# Patient Record
Sex: Female | Born: 1980 | Race: Black or African American | Hispanic: No | Marital: Single | Smoking: Former smoker
Health system: Southern US, Community
[De-identification: ages and names within clinical notes are randomized; demographics above are authoritative.]

## PROBLEM LIST (undated history)

## (undated) DIAGNOSIS — N189 Chronic kidney disease, unspecified: Secondary | ICD-10-CM

## (undated) HISTORY — PX: TUBAL LIGATION: SHX77

---
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
## 2002-01-12 ENCOUNTER — Encounter: Admission: RE | Admit: 2002-01-12 | Discharge: 2002-01-12 | Payer: Self-pay | Admitting: Family Medicine

## 2002-01-19 ENCOUNTER — Ambulatory Visit (HOSPITAL_COMMUNITY): Admission: RE | Admit: 2002-01-19 | Discharge: 2002-01-19 | Payer: Self-pay | Admitting: Family Medicine

## 2002-03-13 ENCOUNTER — Encounter: Admission: RE | Admit: 2002-03-13 | Discharge: 2002-03-13 | Payer: Self-pay | Admitting: Family Medicine

## 2002-03-24 ENCOUNTER — Encounter: Admission: RE | Admit: 2002-03-24 | Discharge: 2002-03-24 | Payer: Self-pay | Admitting: Family Medicine

## 2002-03-24 ENCOUNTER — Encounter (INDEPENDENT_AMBULATORY_CARE_PROVIDER_SITE_OTHER): Payer: Self-pay | Admitting: *Deleted

## 2002-04-08 ENCOUNTER — Encounter: Admission: RE | Admit: 2002-04-08 | Discharge: 2002-04-08 | Payer: Self-pay | Admitting: Family Medicine

## 2002-04-15 ENCOUNTER — Encounter: Admission: RE | Admit: 2002-04-15 | Discharge: 2002-04-15 | Payer: Self-pay | Admitting: Family Medicine

## 2002-04-26 ENCOUNTER — Inpatient Hospital Stay (HOSPITAL_COMMUNITY): Admission: AD | Admit: 2002-04-26 | Discharge: 2002-04-28 | Payer: Self-pay | Admitting: *Deleted

## 2002-06-22 ENCOUNTER — Other Ambulatory Visit: Admission: RE | Admit: 2002-06-22 | Discharge: 2002-06-22 | Payer: Self-pay | Admitting: Family Medicine

## 2002-06-22 ENCOUNTER — Encounter (INDEPENDENT_AMBULATORY_CARE_PROVIDER_SITE_OTHER): Payer: Self-pay | Admitting: *Deleted

## 2002-06-22 ENCOUNTER — Encounter: Admission: RE | Admit: 2002-06-22 | Discharge: 2002-06-22 | Payer: Self-pay | Admitting: Family Medicine

## 2002-07-03 ENCOUNTER — Emergency Department (HOSPITAL_COMMUNITY): Admission: EM | Admit: 2002-07-03 | Discharge: 2002-07-03 | Payer: Self-pay | Admitting: Emergency Medicine

## 2002-08-06 ENCOUNTER — Encounter: Admission: RE | Admit: 2002-08-06 | Discharge: 2002-08-06 | Payer: Self-pay | Admitting: Family Medicine

## 2002-08-20 ENCOUNTER — Encounter: Admission: RE | Admit: 2002-08-20 | Discharge: 2002-08-20 | Payer: Self-pay | Admitting: Family Medicine

## 2004-08-16 ENCOUNTER — Ambulatory Visit: Payer: Self-pay | Admitting: Family Medicine

## 2004-08-21 ENCOUNTER — Encounter (INDEPENDENT_AMBULATORY_CARE_PROVIDER_SITE_OTHER): Payer: Self-pay | Admitting: *Deleted

## 2004-08-23 ENCOUNTER — Ambulatory Visit: Payer: Self-pay | Admitting: Family Medicine

## 2004-09-20 ENCOUNTER — Ambulatory Visit (HOSPITAL_COMMUNITY): Admission: RE | Admit: 2004-09-20 | Discharge: 2004-09-20 | Payer: Self-pay | Admitting: Family Medicine

## 2004-10-10 ENCOUNTER — Ambulatory Visit: Payer: Self-pay | Admitting: Family Medicine

## 2004-11-06 ENCOUNTER — Ambulatory Visit: Payer: Self-pay | Admitting: Family Medicine

## 2004-12-01 ENCOUNTER — Inpatient Hospital Stay (HOSPITAL_COMMUNITY): Admission: AD | Admit: 2004-12-01 | Discharge: 2004-12-01 | Payer: Self-pay | Admitting: Family Medicine

## 2004-12-07 ENCOUNTER — Ambulatory Visit: Payer: Self-pay | Admitting: Family Medicine

## 2004-12-08 ENCOUNTER — Ambulatory Visit (HOSPITAL_COMMUNITY): Admission: RE | Admit: 2004-12-08 | Discharge: 2004-12-08 | Payer: Self-pay | Admitting: Family Medicine

## 2004-12-20 ENCOUNTER — Inpatient Hospital Stay (HOSPITAL_COMMUNITY): Admission: AD | Admit: 2004-12-20 | Discharge: 2004-12-29 | Payer: Self-pay | Admitting: Obstetrics and Gynecology

## 2004-12-21 ENCOUNTER — Ambulatory Visit: Payer: Self-pay | Admitting: Obstetrics and Gynecology

## 2005-01-01 ENCOUNTER — Ambulatory Visit: Payer: Self-pay | Admitting: *Deleted

## 2005-01-04 ENCOUNTER — Ambulatory Visit: Payer: Self-pay | Admitting: Family Medicine

## 2005-01-08 ENCOUNTER — Ambulatory Visit: Payer: Self-pay | Admitting: *Deleted

## 2005-01-08 ENCOUNTER — Observation Stay (HOSPITAL_COMMUNITY): Admission: AD | Admit: 2005-01-08 | Discharge: 2005-01-09 | Payer: Self-pay | Admitting: *Deleted

## 2005-01-08 ENCOUNTER — Ambulatory Visit: Payer: Self-pay | Admitting: Obstetrics & Gynecology

## 2005-01-08 ENCOUNTER — Ambulatory Visit: Payer: Self-pay | Admitting: Pediatrics

## 2005-01-11 ENCOUNTER — Ambulatory Visit: Payer: Self-pay | Admitting: Family Medicine

## 2005-01-15 ENCOUNTER — Ambulatory Visit: Payer: Self-pay | Admitting: Family Medicine

## 2005-01-18 ENCOUNTER — Ambulatory Visit (HOSPITAL_COMMUNITY): Admission: RE | Admit: 2005-01-18 | Discharge: 2005-01-18 | Payer: Self-pay | Admitting: *Deleted

## 2005-01-18 ENCOUNTER — Ambulatory Visit: Payer: Self-pay | Admitting: Family Medicine

## 2005-01-22 ENCOUNTER — Inpatient Hospital Stay (HOSPITAL_COMMUNITY): Admission: AD | Admit: 2005-01-22 | Discharge: 2005-01-24 | Payer: Self-pay | Admitting: Obstetrics and Gynecology

## 2005-01-22 ENCOUNTER — Ambulatory Visit: Payer: Self-pay | Admitting: Obstetrics & Gynecology

## 2005-01-22 ENCOUNTER — Ambulatory Visit: Payer: Self-pay | Admitting: Obstetrics and Gynecology

## 2005-12-12 ENCOUNTER — Ambulatory Visit: Payer: Self-pay | Admitting: Family Medicine

## 2005-12-14 ENCOUNTER — Ambulatory Visit (HOSPITAL_COMMUNITY): Admission: RE | Admit: 2005-12-14 | Discharge: 2005-12-14 | Payer: Self-pay | Admitting: Obstetrics and Gynecology

## 2005-12-28 ENCOUNTER — Ambulatory Visit: Payer: Self-pay | Admitting: Family Medicine

## 2005-12-28 ENCOUNTER — Encounter (INDEPENDENT_AMBULATORY_CARE_PROVIDER_SITE_OTHER): Payer: Self-pay | Admitting: Specialist

## 2005-12-28 ENCOUNTER — Other Ambulatory Visit: Admission: RE | Admit: 2005-12-28 | Discharge: 2005-12-28 | Payer: Self-pay | Admitting: Family Medicine

## 2006-02-21 ENCOUNTER — Ambulatory Visit: Payer: Self-pay | Admitting: Family Medicine

## 2006-02-27 ENCOUNTER — Ambulatory Visit: Payer: Self-pay | Admitting: Family Medicine

## 2006-03-06 ENCOUNTER — Inpatient Hospital Stay (HOSPITAL_COMMUNITY): Admission: AD | Admit: 2006-03-06 | Discharge: 2006-03-06 | Payer: Self-pay | Admitting: Gynecology

## 2006-03-06 ENCOUNTER — Ambulatory Visit: Payer: Self-pay | Admitting: Family Medicine

## 2006-03-11 ENCOUNTER — Ambulatory Visit: Payer: Self-pay | Admitting: Family Medicine

## 2006-03-20 ENCOUNTER — Ambulatory Visit: Payer: Self-pay | Admitting: Family Medicine

## 2006-03-29 ENCOUNTER — Ambulatory Visit: Payer: Self-pay | Admitting: Family Medicine

## 2006-04-03 ENCOUNTER — Ambulatory Visit: Payer: Self-pay | Admitting: Family Medicine

## 2006-04-10 ENCOUNTER — Ambulatory Visit: Payer: Self-pay | Admitting: Family Medicine

## 2006-04-19 ENCOUNTER — Ambulatory Visit: Payer: Self-pay | Admitting: Family Medicine

## 2006-04-20 ENCOUNTER — Inpatient Hospital Stay (HOSPITAL_COMMUNITY): Admission: AD | Admit: 2006-04-20 | Discharge: 2006-04-22 | Payer: Self-pay | Admitting: Obstetrics and Gynecology

## 2006-04-20 ENCOUNTER — Ambulatory Visit: Payer: Self-pay | Admitting: Obstetrics and Gynecology

## 2006-09-12 DIAGNOSIS — A630 Anogenital (venereal) warts: Secondary | ICD-10-CM | POA: Insufficient documentation

## 2006-09-12 HISTORY — DX: Anogenital (venereal) warts: A63.0

## 2006-09-13 ENCOUNTER — Encounter (INDEPENDENT_AMBULATORY_CARE_PROVIDER_SITE_OTHER): Payer: Self-pay | Admitting: *Deleted

## 2006-10-30 ENCOUNTER — Ambulatory Visit: Payer: Self-pay | Admitting: Family Medicine

## 2007-02-20 ENCOUNTER — Encounter: Payer: Self-pay | Admitting: *Deleted

## 2007-02-20 ENCOUNTER — Ambulatory Visit: Payer: Self-pay | Admitting: Family Medicine

## 2007-02-20 DIAGNOSIS — F17200 Nicotine dependence, unspecified, uncomplicated: Secondary | ICD-10-CM | POA: Insufficient documentation

## 2007-04-03 IMAGING — US US OB FOLLOW-UP
1 series · 18 of 28 positions shown · non-contrast
Comparison: none

CLINICAL DATA: Evaluate fetal growth; marginal insertion of umbilical cord seen previously.

[Series 1: us ob re-eval · 18 of 82 slices shown]
[im 1/82]
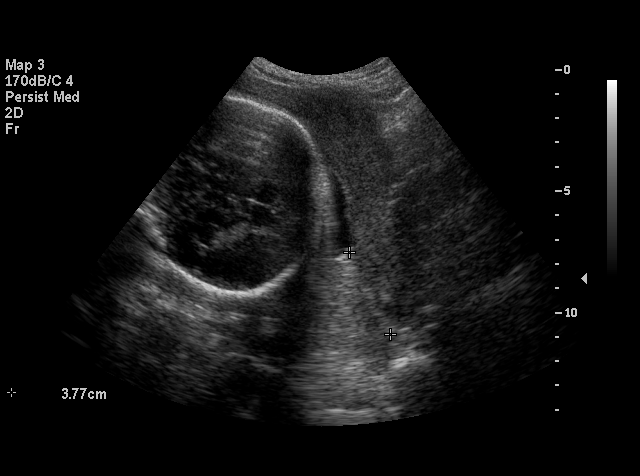
[im 7/82]
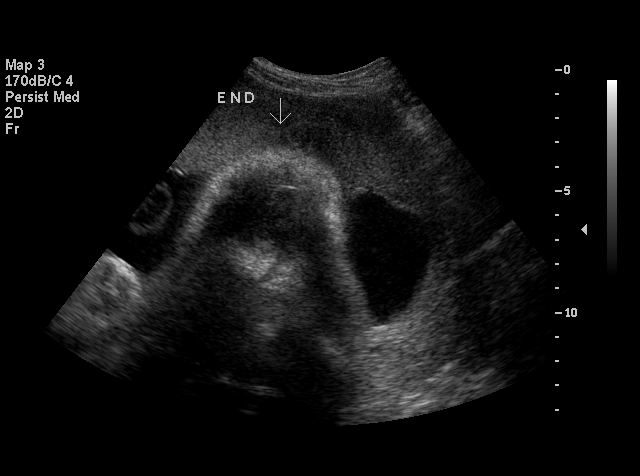
[im 10/82]
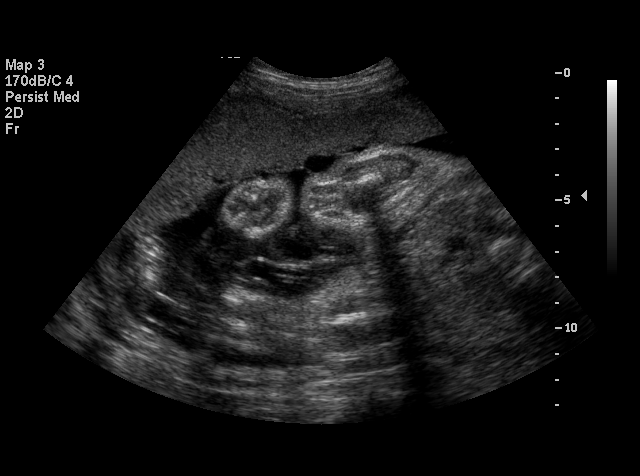
[im 16/82]
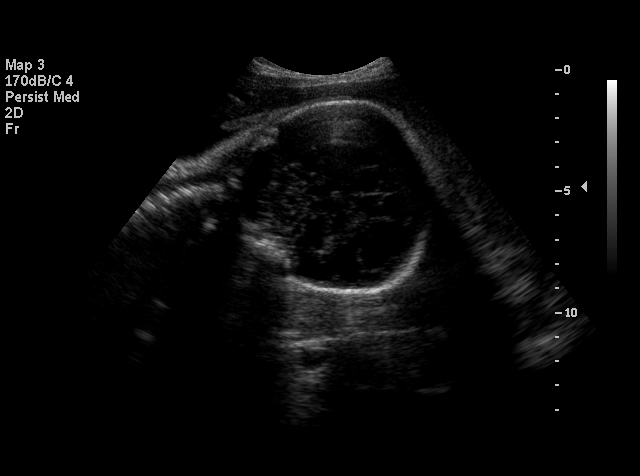
[im 22/82]
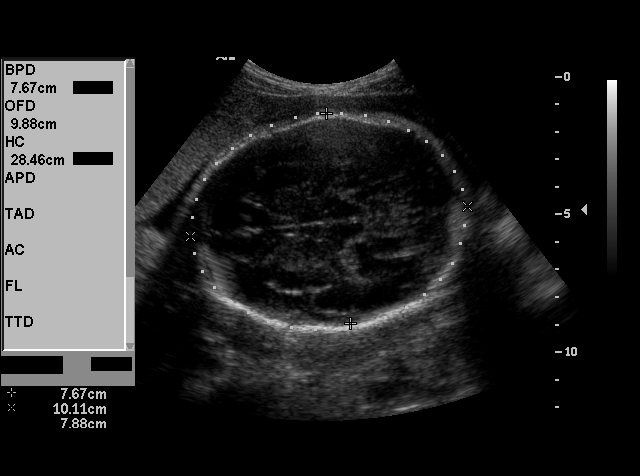
[im 25/82]
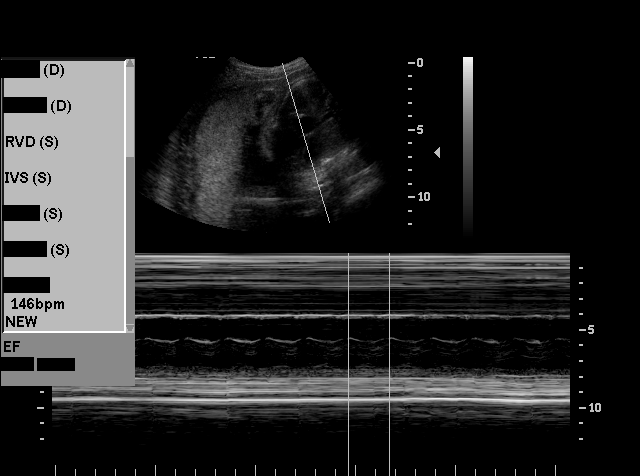
[im 31/82]
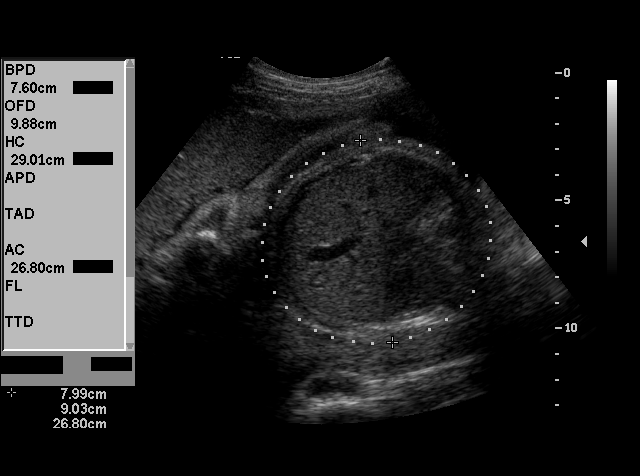
[im 34/82]
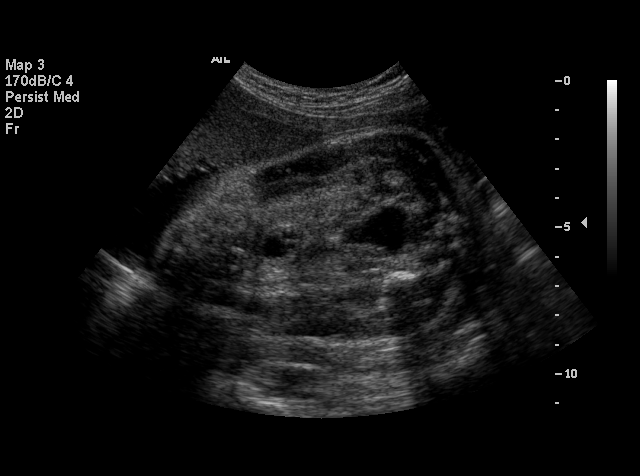
[im 40/82]
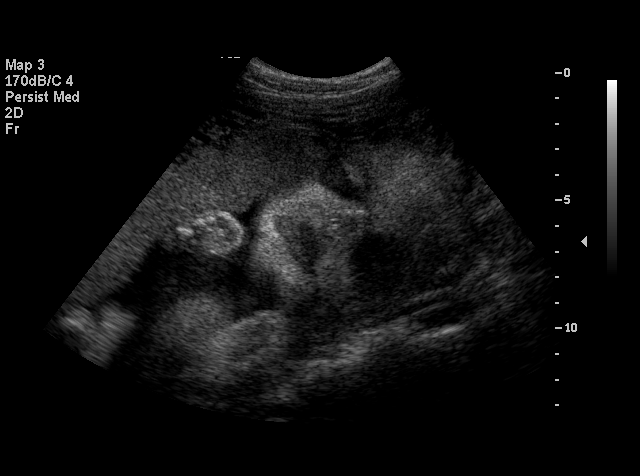
[im 43/82]
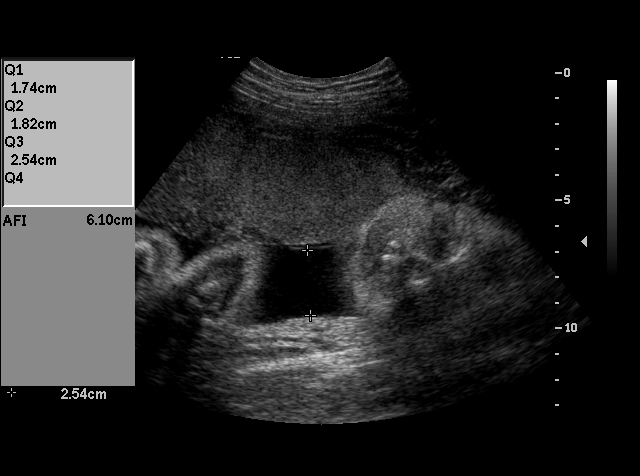
[im 49/82]
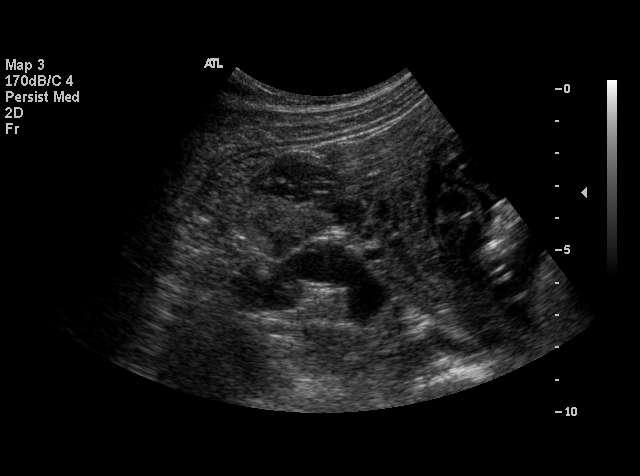
[im 52/82]
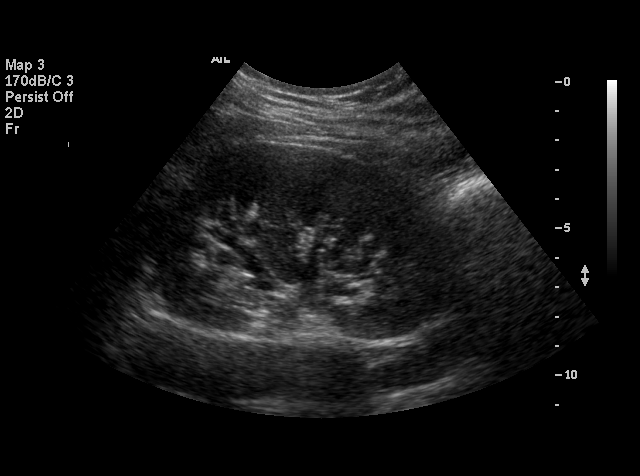
[im 58/82]
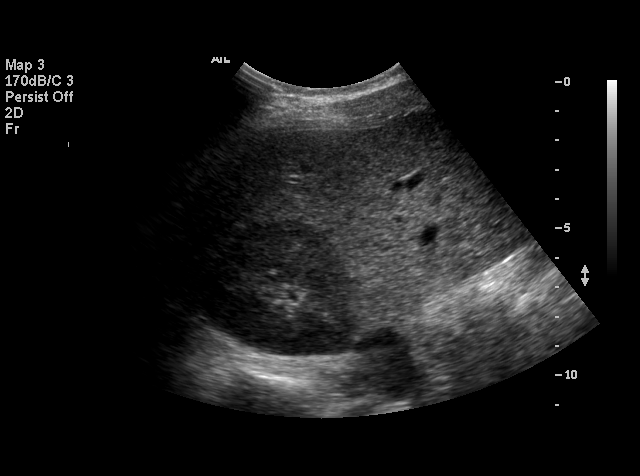
[im 64/82]
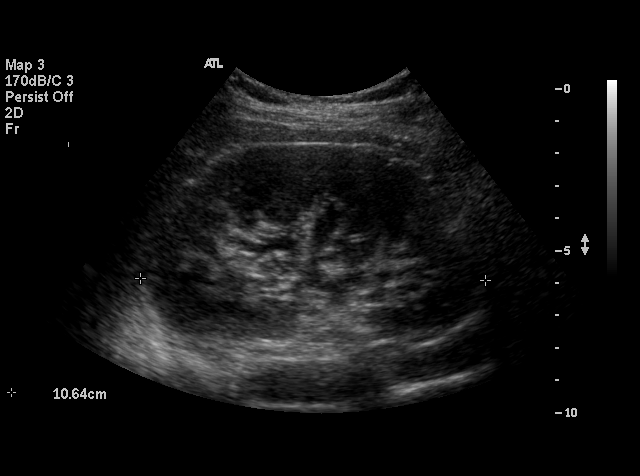
[im 67/82]
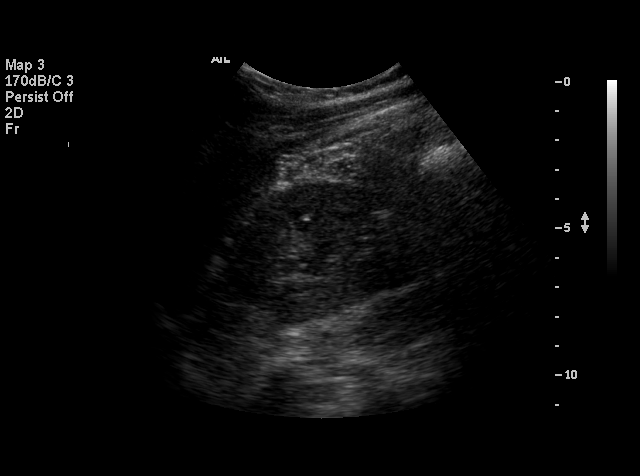
[im 73/82]
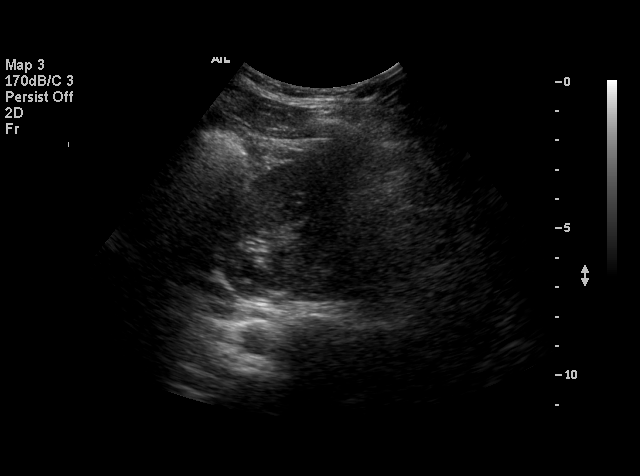
[im 76/82]
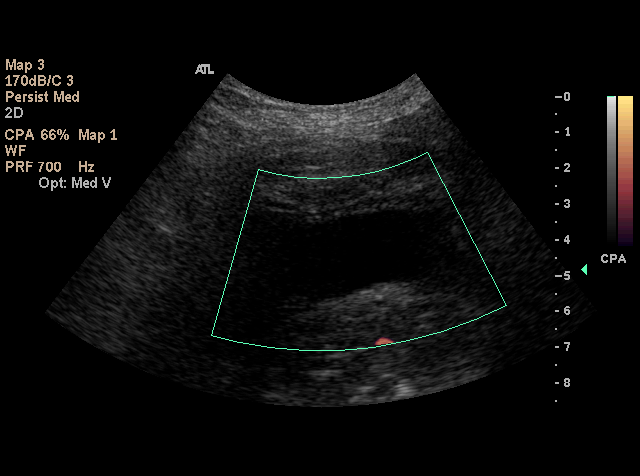
[im 82/82]
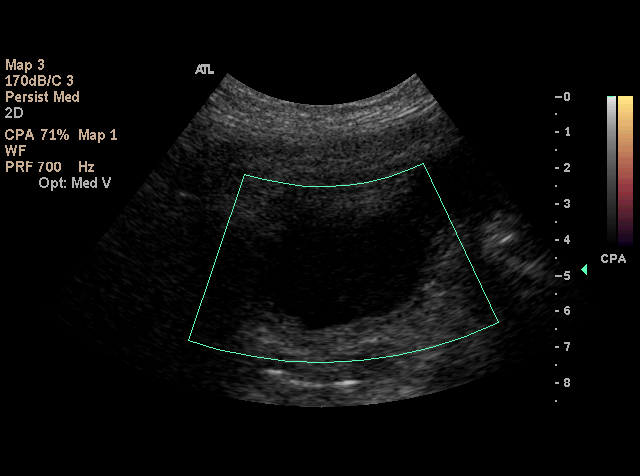

[18 of 28 positions shown; findings below may reference images not displayed]

OBSTETRICAL ULTRASOUND RE-EVALUATION:
Number of Fetuses:  1
Heart Rate:  146 bpm
Movement:  Yes
Breathing:  Yes
Presentation:  Cephalic 
Placental Location:  Anterior
Grade:  1
Previa:  No
Comment:  Cord insertion not seen due to advanced gestational age and fetal position.
Amniotic Fluid (subjective):  Normal
Amniotic Fluid (objective):  AFI 10.4 cm (0th-20th %ile = 9.0-23.4 cm for 30 weeks) 

FETAL BIOMETRY
BPD:  7.8 cm  30 w  6 d
HC:  28.8 cm  31 w  5 d
AC:  26.3 cm  30 w  4 d
FL:  5.9 cm  30 w  4 d

MEAN GA:  31 w  0 d
Assigned GA:  30 w, 4 d

EFW:  2742 grams (H) 68th-14th %ile (1311-8537 g) for 30 weeks 

FETAL ANATOMY
Lateral Ventricles:  Visualized 
Thalami/CSP:  Visualized 
Posterior Fossa:  Visualized 
Nuchal Region:  N/A
Spine:  Not Visualized 
4 Chamber Heart on Left:   Not Visualized 
Stomach on Left:  Visualized 
3 Vessel Cord:  Visualized 
Cord Insertion Site:  Not Visualized secondary to advanced gestational age
Kidneys:  Visualized 
Bladder:  Visualized 
Extremities:  Not Visualized 

ADDITIONAL ANATOMY VISUALIZED:  Diaphragm.

MATERNAL UTERINE AND ADNEXAL FINDINGS
Cervix:  3.8 cm transabdominally
IMPRESSION: There is a single living intrauterine gestation in cephalic presentation.  The mean gestational age by today?s ultrasound is 31 weeks, 0 days which is concordant with the assigned gestational age of 30 weeks, 0 days.  The estimated fetal weight is at the 75-90th percentile for a 30 week gestation.  The fetal indices are concordant.

## 2007-05-16 ENCOUNTER — Ambulatory Visit: Payer: Self-pay | Admitting: Family Medicine

## 2007-05-16 ENCOUNTER — Other Ambulatory Visit: Admission: RE | Admit: 2007-05-16 | Discharge: 2007-05-16 | Payer: Self-pay | Admitting: Family Medicine

## 2007-05-16 ENCOUNTER — Encounter: Payer: Self-pay | Admitting: Family Medicine

## 2007-05-16 LAB — CONVERTED CEMR LAB: GC Probe Amp, Genital: POSITIVE — AB

## 2007-05-19 ENCOUNTER — Telehealth: Payer: Self-pay | Admitting: Family Medicine

## 2007-05-23 ENCOUNTER — Ambulatory Visit: Payer: Self-pay | Admitting: Family Medicine

## 2007-06-25 ENCOUNTER — Encounter: Payer: Self-pay | Admitting: Family Medicine

## 2007-09-11 ENCOUNTER — Encounter (INDEPENDENT_AMBULATORY_CARE_PROVIDER_SITE_OTHER): Payer: Self-pay | Admitting: Family Medicine

## 2007-09-11 ENCOUNTER — Ambulatory Visit: Payer: Self-pay | Admitting: Family Medicine

## 2007-09-11 ENCOUNTER — Telehealth: Payer: Self-pay | Admitting: *Deleted

## 2009-01-25 ENCOUNTER — Encounter: Payer: Self-pay | Admitting: Family Medicine

## 2009-03-22 ENCOUNTER — Emergency Department (HOSPITAL_COMMUNITY): Admission: EM | Admit: 2009-03-22 | Discharge: 2009-03-22 | Payer: Self-pay | Admitting: Emergency Medicine

## 2009-03-22 ENCOUNTER — Encounter (INDEPENDENT_AMBULATORY_CARE_PROVIDER_SITE_OTHER): Payer: Self-pay | Admitting: Internal Medicine

## 2009-03-24 ENCOUNTER — Ambulatory Visit: Payer: Self-pay | Admitting: Family Medicine

## 2009-03-31 ENCOUNTER — Ambulatory Visit: Payer: Self-pay | Admitting: Family Medicine

## 2009-12-30 ENCOUNTER — Encounter: Payer: Self-pay | Admitting: Family Medicine

## 2010-06-15 ENCOUNTER — Emergency Department (HOSPITAL_COMMUNITY)
Admission: EM | Admit: 2010-06-15 | Discharge: 2010-06-15 | Payer: Self-pay | Source: Home / Self Care | Admitting: Family Medicine

## 2010-06-15 ENCOUNTER — Emergency Department (HOSPITAL_COMMUNITY)
Admission: EM | Admit: 2010-06-15 | Discharge: 2010-06-15 | Payer: Self-pay | Source: Home / Self Care | Admitting: Emergency Medicine

## 2010-08-15 NOTE — Miscellaneous (Signed)
  Clinical Lists Changes  Problems: Removed problem of PHYSICAL EXAMINATION, NORMAL (ICD-V70.0) Removed problem of SCREENING FOR MALIGNANT NEOPLASM OF THE CERVIX (ICD-V76.2)

## 2010-10-20 LAB — CBC: RBC: 3.92 MIL/uL (ref 3.87–5.11)

## 2010-10-20 LAB — POCT INFECTIOUS MONO SCREEN: Mono Screen: POSITIVE — AB

## 2010-10-20 LAB — DIFFERENTIAL
Basophils Absolute: 0 10*3/uL (ref 0.0–0.1)
Basophils Relative: 0 % (ref 0–1)
Eosinophils Absolute: 0 10*3/uL (ref 0.0–0.7)
Eosinophils Relative: 0 % (ref 0–5)
Lymphocytes Relative: 14 % (ref 12–46)
Lymphs Abs: 1.5 10*3/uL (ref 0.7–4.0)
Monocytes Absolute: 0.7 10*3/uL (ref 0.1–1.0)

## 2010-10-20 LAB — POCT URINALYSIS DIP (DEVICE)
Glucose, UA: NEGATIVE mg/dL
Ketones, ur: 15 mg/dL — AB
Protein, ur: 100 mg/dL — AB
Specific Gravity, Urine: 1.025 (ref 1.005–1.030)

## 2010-10-20 LAB — POCT RAPID STREP A (OFFICE): Streptococcus, Group A Screen (Direct): NEGATIVE

## 2010-12-01 NOTE — H&P (Signed)
[facility]  Patient:    Sonya Garner, FILIPPONE Visit Number: [id number] MRN: [id number]          Service Type: OBS Location: 910A [id number] Attending Physician:  Leonard Schwartz Dictated by:   Mack Guise, C.N.M. Admit Date:  03/23/2001                           History and Physical  HISTORY OF PRESENT ILLNESS:   [name] is an 30 year old, gravida 1, para 0 at 51 weeks who presents with increasing labor symptoms. She reports positive fetal movement, no bleeding, no rupture of membranes, denies any headache, visual changes, or epigastric pain. Her pregnancy has been followed by the CNM service at [facility] and is remarkable for (1) rubella nonimmune, (2) limited prenatal care, she transferred to our service at approximately [redacted] weeks gestation after having one prenatal visit at the [facility], (3) teen, and (4) group B strep negative. Her pregnancy was initially evaluated in April at [facility]. She had one visit there at 18 weeks and had an ultrasound confirming dates at that time.  PRENATAL LABORATORY DATA:     Hemoglobin and hematocrit 11.3 and 32.5, platelets 248,000, blood type and Rh O positive, antibody screen negative, syphilis negative, hepatitis B surface antigen negative, rubella nonimmune, hemoglobin electrophoresis normal, HIV negative, GC and Chlamydia negative, Pap smear within normal limits, triple screen within normal limits, and culture of the vaginal tract at 36 weeks is negative for group B strep.  This patients pregnancy has been essentially unremarkable. She has been size equal to dates throughout and normotensive with no proteinuria.  PAST MEDICAL HISTORY:         Unremarkable. Patient has a history of asthma with infrequent attacks and no medications.  FAMILY HISTORY:               Paternal grandmother diabetes, paternal aunt and paternal grandmother hypertension, maternal  grandmother cancer of unknown type.  PAST SURGICAL HISTORY:        Left breast lumpectomy, benign.  GENETIC HISTORY:              There is no family history of genetic or chromosomal disorders, children that died in infancy or that were born with birth defects.  ALLERGIES:                    No known drug allergies.  HABITS:                       Patient denies the use of tobacco, alcohol, or illicit drugs.  SOCIAL HISTORY:               [name] is an 30 year old, single African-American female. She follows the [employer].  REVIEW OF SYSTEMS:            There are no signs or symptoms suggestive of focal or systemic disease and the patient is typical on one with a uterine pregnancy at term in active labor.  PHYSICAL EXAMINATION:  VITAL SIGNS:                  Vitals signs stable, afebrile.  HEENT:                        Unremarkable.  NECK:  Thyroid not enlarged.  HEART:                        Regular rate and rhythm.  LUNGS:                        Clear.  ABDOMEN:                      Gravid in its contour. Uterine fundus is noted to extend 39 cm above the level of the pubic symphysis. Leopolds maneuvers finds the infant in a longitudinal lie, cephalic presentation and the estimated fetal weight is 7-1/2 pounds.  PELVIC:                       Digital exam of the cervix finds it to be 5 cm dilated, completely effaced with the cephalic presenting part at a -1 station and a bulging bag of water. The patient had spontaneous rupture of membranes for light meconium-stained fluid following exam.  ELECTRONIC FETAL MONITORING:  Fetal heart rate is reactive and reassuring. The patient is contracting every two to three minutes.  EXTREMITIES:                  No pathologic edema. DTRs are 1+ with no clonus.  ASSESSMENT:                   1. Intrauterine pregnancy at term.                               2. Active labor.  PLAN:                          1. Admit per Dr. Leonard Schwartz.                               2. Routine CNM orders. Dictated by:   Mack Guise, C.N.M. Attending Physician:  Leonard Schwartz DD:  03/23/01 TD:  03/23/01 Job: [id number]

## 2010-12-01 NOTE — Op Note (Signed)
Sonya Garner, Sonya Garner              ACCOUNT NO.:  [id number]   MEDICAL RECORD NO.:  [id number]          PATIENT TYPE:  INP   LOCATION:  9126                          FACILITY:  [facility]   PHYSICIAN:  Tanya S. Shawnie Pons, M.D.   DATE OF BIRTH:  1981-07-15   DATE OF PROCEDURE:  04/21/2006  DATE OF DISCHARGE:                                 OPERATIVE REPORT   PREOPERATIVE DIAGNOSIS:  Multiparity and undesired fertility.   POSTOPERATIVE DIAGNOSIS:  Multiparity and undesired fertility.   PROCEDURE:  Postpartum bilateral tubal ligation with Filshie clips.   SURGEON:  Shelbie Proctor. Shawnie Pons, M.D.   ANESTHESIA:  Epidural and local, Quillian Quince, M.D.   FINDINGS:  Normal-appearing tubes and ovaries.   ESTIMATED BLOOD LOSS:  Minimal.   COMPLICATIONS:  None.   INDICATIONS FOR PROCEDURE:  Briefly, patient is a 30 year old gravida 4,  para 4 who is postpartum day #1 from a vaginal delivery who desires  permanent sterilization.  Patient is counseled of her risks and benefits of  this procedure including permanency of the procedure, risk of failure 1:200,  increased of ectopic.  Patient understands these risks and agrees to  proceed.   DESCRIPTION OF PROCEDURE:  Patient taken to the OR and placed in the supine  position.  Epidural analgesia was dosed.  Once this was found to be  adequate, she was prepped and draped in the usual sterile fashion.  __________ was used to confirm anesthesia was adequate and then the  infraumbilical area was infiltrated with 7 mL of 0.25% Marcaine.  Two  __________ were then used to elevate the umbilical area and a 1.5 cm  incision was made through the skin down to the underlying fascia.  The  peritoneal cavity was entered sharply.  Once the peritoneal cavity was  entered, attention was turned to the patient's left tube.  This was found  easily, grasped with a Babcock clamp and followed to its fimbriated end  approximately 1.5 cm from the cornu. A Filshie clip was placed  across the  tube under direct visualization.  Similarly, the patient's right tube was  identified, grasped with a Babcock clamp and followed to its fimbriated end.  A Filshie clip was also placed on a 1.5 cm from the cornu on this tube under  direct visualization as well.  Both tubes were returned to the abdominal  cavity. The fascia was grasped with Allis clamps  and closed with a 0 Vicryl suture.  The skin was closed with 3-0 Vicryl  suture in a subcuticular fashion.  All instrument and lap counts were  correct x2.  The patient was awakened and taken to the recovery room in  stable condition.           ______________________________  Shelbie Proctor Shawnie Pons, M.D.     TSP/MEDQ  D:  04/21/2006  T:  04/22/2006  Job:  [id number]

## 2010-12-01 NOTE — Op Note (Signed)
[place]  Patient:    Sonya Garner, Sonya Garner                     MRN: [id number] Proc. Date: 06/21/00 Adm. Date:  [id number] Attending:  Sonda Primes                           Operative Report  PREOPERATIVE DIAGNOSIS:  Fibroadenoma of the left breast.  POSTOPERATIVE DIAGNOSIS:  Fibroadenoma of the left breast.  OPERATION PERFORMED:  Excisional biopsy of left breast mass.  SURGEON:  Mardene Celeste. Lurene Shadow, M.D.  ASSISTANT:  Nurse.  ANESTHESIA:  General.  INDICATIONS FOR PROCEDURE:  The patient is a 30 year old young woman presenting with an enlarging left-sided breast mass.  The mass is clinically, a fibroadenoma and she desires excision.  DESCRIPTION OF PROCEDURE:  Following the induction of satisfactory general anesthesia, with the patient positioned supinely, the left breast was prepped and draped to be included in a sterile operative field.  I made a circumareolar incision on the medial aspect of the areolar border, deepened this through the skin and subcutaneous tissues and carried dissection medially to the region of the mass.  The mass was incised and dissected down upon and excised in its entirety and forwarded for pathologic evaluation.  Hemostasis was obtained with electrocautery.  Sponge, instrument and sharp counts were verified.  The breast tissues were reapproximated with 3-0 Vicryl. Subcutaneous tissues were closed with interrupted 3-0 Vicryl.  Skin closed with a running 5-0 Monocryl and then reinforced with Steri-Strips and a sterile compressive dressing applied.  Anesthetic reversed.  Patient removed from the operating room to the recovery room in stable condition having tolerated the procedure well. DD:  06/21/00 TD:  06/21/00 Job: [id number]

## 2010-12-01 NOTE — Discharge Summary (Signed)
Sonya Garner, Sonya Garner              ACCOUNT NO.:  [id number]   MEDICAL RECORD NO.:  [id number]          PATIENT TYPE:  INP   LOCATION:  9155                          [facility]ACILITY:  [facility]   PHYSICIAN:  Phil D. Okey Dupre, M.D.     DATE OF BIRTH:  1981/06/23   DATE OF ADMISSION:  12/20/2004  DATE OF DISCHARGE:  12/29/2004                                 DISCHARGE SUMMARY   ADMISSION DIAGNOSES:  66.  A 30 year old gravida 3, para 2-0-0-2, at 31 weeks 5 days.  2.  Intrauterine pregnancy.  3.  Vaginal bleeding.   DISCHARGE DIAGNOSES:  5.  A 30 year old gravida 3, para 2-0-0-2, at 32 weeks 6 days.  2.  Intrauterine pregnancy.  3.  Vaginal bleeding resolved.  4.  Gestational diabetic, diet-controlled.   The patient was discharged with the following medications:  Prenatal  vitamins one tablet p.o. daily.   FOLLOW-UP:  The patient is to follow up at the high-risk clinic, first  appointment January 01, 2005, at 2 o'clock, and will be seen every Monday for  NST/AFI.   ADMISSION HISTORY:  Sonya Garner is a 30 year old G3, P2-0-0-2, female that  presented to the MAU at 31 weeks 5 days complaining of vaginal bleeding.  The patient did have mild vaginal bleeding two days prior to admission that  resolved but restarted night of admission.  She states that amount of  bleeding was less than the size of a quarter.  She denied abdominal pain,  denies fever, chills, nausea, vomiting.  Complained of urinary frequency but  no dysuria.  The patient reported good fetal activity, denied contractions,  denied leakage of fluid.   PHYSICAL EXAMINATION ON ADMISSION:  Temperature 98.7, pulse 81, respiration  rate 18, blood pressure 115/64.  Speculum exam significant for active  bleeding from external os, multiparous cervix.  Digital cervical exam was  significant for long cervix, fingertip external os and high fetal  positioning.  Fetal monitoring was reassuring; however, considering active  vaginal bleeding, the patient  was admitted for monitoring.   ADMISSION COURSE:  Problem 1.  VAGINAL BLEEDING:  Pads were placed for pad checks to quantify  the amount of vaginal bleeding.  Only occasional spotting throughout  hospital admission, which resolved by discharge date.  The patient was given  betamethasone injections x2 secondary to occasional contractions with  increased frequency per tocometer.  Fetal fibronectin was negative as well  as ANA and antiphospholipid panel.  The patient did have some variables to  the 100s for five minutes with rebound fetal tachycardia but overall, the  strip was reassuring.  Decelerations seemed to occur when the patient was  ambulatory to the rest room.  She was monitored throughout admission with  external fetal monitor.  Decelerations resolved 24 hours prior to discharge  date.   Problem 2.  DIABETES MELLITUS, DIET-CONTROLLED:  The patient found to have  elevated CBGs during admission.  She was initially placed on glyburide.  Glyburide was discontinued secondary to placing patient on ADA diet in which  her CBGs were much better controlled.  The patient was discharged on  an ADA  diet and will follow up for CBG check in high-risk clinic.   DISCHARGE:  The patient was discharged in stable and improved condition.  Her vaginal bleeding had resolved.  External fetal monitoring reassuring  with 15 x 15 accelerations, no decelerations.  The patient will follow up at  high-risk clinic.      Bonnita Hollow, M.D.    ______________________________  Javier Glazier. Okey Dupre, M.D.    VRE/MEDQ  D:  03/15/2005  T:  03/15/2005  Job:  [id number]

## 2013-11-25 ENCOUNTER — Inpatient Hospital Stay (HOSPITAL_COMMUNITY): Payer: Self-pay

## 2013-11-25 ENCOUNTER — Inpatient Hospital Stay (HOSPITAL_COMMUNITY)
Admission: AD | Admit: 2013-11-25 | Discharge: 2013-11-25 | Disposition: A | Discharge status: Home / Self Care | Payer: Self-pay | Source: Ambulatory Visit | Attending: Obstetrics & Gynecology | Admitting: Obstetrics & Gynecology

## 2013-11-25 ENCOUNTER — Encounter (HOSPITAL_COMMUNITY): Payer: Self-pay | Admitting: *Deleted

## 2013-11-25 DIAGNOSIS — R102 Pelvic and perineal pain: Secondary | ICD-10-CM

## 2013-11-25 DIAGNOSIS — F17200 Nicotine dependence, unspecified, uncomplicated: Secondary | ICD-10-CM | POA: Insufficient documentation

## 2013-11-25 DIAGNOSIS — N189 Chronic kidney disease, unspecified: Secondary | ICD-10-CM | POA: Insufficient documentation

## 2013-11-25 DIAGNOSIS — N949 Unspecified condition associated with female genital organs and menstrual cycle: Secondary | ICD-10-CM

## 2013-11-25 DIAGNOSIS — R1032 Left lower quadrant pain: Secondary | ICD-10-CM | POA: Insufficient documentation

## 2013-11-25 HISTORY — DX: Chronic kidney disease, unspecified: N18.9

## 2013-11-25 LAB — WET PREP, GENITAL
Clue Cells Wet Prep HPF POC: NONE SEEN
Trich, Wet Prep: NONE SEEN
WBC, Wet Prep HPF POC: NONE SEEN
Yeast Wet Prep HPF POC: NONE SEEN

## 2013-11-25 LAB — CBC
HCT: 32.9 % — ABNORMAL LOW (ref 36.0–46.0)
Hemoglobin: 10.8 g/dL — ABNORMAL LOW (ref 12.0–15.0)
MCH: 25.4 pg — ABNORMAL LOW (ref 26.0–34.0)
MCHC: 32.8 g/dL (ref 30.0–36.0)
MCV: 77.4 fL — ABNORMAL LOW (ref 78.0–100.0)
PLATELETS: 236 10*3/uL (ref 150–400)
RBC: 4.25 MIL/uL (ref 3.87–5.11)
RDW: 16.7 % — ABNORMAL HIGH (ref 11.5–15.5)
WBC: 4.4 10*3/uL (ref 4.0–10.5)

## 2013-11-25 LAB — URINALYSIS, ROUTINE W REFLEX MICROSCOPIC
Bilirubin Urine: NEGATIVE
Glucose, UA: NEGATIVE mg/dL
Hgb urine dipstick: NEGATIVE
Ketones, ur: NEGATIVE mg/dL
LEUKOCYTES UA: NEGATIVE
Nitrite: NEGATIVE
Protein, ur: NEGATIVE mg/dL
UROBILINOGEN UA: 0.2 mg/dL (ref 0.0–1.0)
pH: 6 (ref 5.0–8.0)

## 2013-11-25 LAB — POCT PREGNANCY, URINE: PREG TEST UR: NEGATIVE

## 2013-11-25 LAB — HCG, QUANTITATIVE, PREGNANCY: hCG, Beta Chain, Quant, S: <1 m[IU]/mL (ref <5)

## 2013-11-25 NOTE — Discharge Instructions (Signed)
Pelvic Exam A pelvic (gynecologic) exam is an exam of a woman's outer and inner genitals and reproductive organs. At age 31, or before a woman starts to have sexual intercourse, she should have her first pelvic exam. Pelvic exams allow your caregiver to check on normal development and screen for health problems. These exams should be done regularly throughout a woman's life. Usually, a general physical exam is done first. An exam of the breasts is also done. At this visit, you can ask questions about your health, body, menstrual cycles, sex, and birth control methods. Your caregiver will also ask you questions about your health, family health, menstrual periods, immunizations, and if you are sexually active. The information shared between you and your caregiver is kept confidential. REASONS FOR A PELVIC EXAM  Annual exam and Pap test. A Pap test removes cells from the cervix gently with a spatula and a small brush. The cells are tested for infection, precancer, and cancer.  A Pap test is done to screen for cervical cancer.  The first Pap test should be done at age 21.  Between ages 21 and 29, Pap tests are repeated every 2 years.  Beginning at age 30, you are advised to have a Pap test every 3 years as long as your past 3 Pap tests have been normal.  Some women have medical problems that increase the chance of getting cervical cancer. Talk to your caregiver about these problems. It is especially important to talk to your caregiver if a new problem develops soon after your last Pap test. In these cases, your caregiver may recommend more frequent screening and Pap tests.  The above recommendations are the same for women who have or have not gotten the vaccine for HPV (Human Papillomavirus).  If you had a hysterectomy for a problem that was not cancer or a condition that could lead to cancer, then you no longer need Pap tests. However, even if you no longer need a Pap test, a regular exam is a good  idea to make sure no other problems are starting.   If you are between ages 65 and 70, and you have had normal Pap tests going back 10 years, you no longer need Pap tests. However, even if you no longer need a Pap test, a regular exam is a good idea to make sure no other problems are starting.   If you have had past treatment for cervical cancer or a condition that could lead to cancer, you need Pap tests and screening for cancer for at least 20 years after your treatment.  If Pap tests have been discontinued, risk factors (such as a new sexual partner) need to be re-assessed to determine if screening should be resumed.  Some women may need screenings more often if they are at high risk for cervical cancer.  Make sure your female organs are normal and functioning correctly.  Evaluate a mass or other symptoms that suggest a reproductive system cancer.  Explore why you are not able to get pregnant (infertility).  Find a cause for vaginal discharge, itching, or burning.  Get certain types of birth control or start hormone therapy.  Look for causes of urinary incontinence or sexual problems.  Look for signs of sexually transmitted infection (STI).  Follow the progression of labor.  Determine if pregnancy is present or how far advanced the pregnancy is.  You have severe cramps during your menstrual period.  You have pain during sexual intercourse.  You have abnormal   menstrual periods.  You have no menstrual period by the age of 16. PROCEDURE   A pelvic exam is usually painless but may cause mild discomfort.  In unusual circumstances or in young girls, medicines may be used for comfort. A pelvic exam is not done routinely before a girl is sexually active. Special circumstances such as rape, trauma, or medical problems may require an exam.  You will remove all your clothes and will be given a gown. Usually, there is a nurse in the room during the exam and you can have someone  from your family with you also.  The general physical exam will be done first.  Before the pelvic exam starts, the woman lies down on her back on a special table. She puts the heels of her feet into foot rests (stirrups) with her legs apart. A gown, cloth, or paper drape is usually placed over her belly (abdomen) and legs. First, the caregiver checks the normal arrangement of body parts of the outer genitals. This includes the clitoris, vaginal opening, hymen, labia, and the perineal area between the vagina and rectum. The labia are the skin folds surrounding the vaginal opening. The tube that carries urine (urethra) is also examined.  An internal exam is done next. First, the caregiver inserts an instrument called a speculum into the vagina. The speculum has lubricant on it. The speculum helps hold the vaginal walls apart. The caregiver can then examine the vagina and cervix, which is the opening to the womb (uterus). Cultures of any discharge may be taken to check for an infection. A Pap test may be done.  After the internal exam is done, the speculum is removed. The caregiver uses latex gloves with a lubricant on the fingers to gently press against various pelvic organs from inside the vagina while the other hand is on the lower belly. The caregiver will note any tenderness or abnormalities.  If a pelvic exam is done on a woman who is thought to be in labor, her caregiver can check on the baby and how far her cervix has opened.  Following the exam, you will get dressed and can speak with your caregiver.  Ask your caregiver when and how often you should return for future visits. Finding out the results of your test Ask when your test results will be ready. Make sure you get your test results. TO HAVE A HEALTHY LIFESTYLE:  Follow your caregiver's advice regarding follow-up and future visits.  Get the necessary immunizations according to your age and any traveling you may do.  Eat a balanced,  nourishing diet.  Get plenty of rest and sleep.  Exercise regularly.  Maintain a healthy weight.  Do not smoke or take illegal drugs.  Drink alcohol in moderation or not at all.  If you are sexually active, use some form of birth control if you do not plan to get pregnant.  If you are sexually active, practice safe sex by using a condom to protect against sexually transmitted disease (STD).  Get help or counseling if you have emotional problems. Document Released: 09/22/2002 Document Revised: 09/24/2011 Document Reviewed: 09/28/2009 [employer] Patient Information 2014 [employer].  

## 2013-11-25 NOTE — MAU Note (Signed)
Patient states she has been having left mid abdominal pain for about one month. Has had negative home pregnancy test but is concerned she may have an ectopic pregnancy. States she had her tubes tied 7 years ago. Denies bleeding or discharge. Has slight nausea off and on, no vomiting.

## 2013-11-25 NOTE — MAU Provider Note (Signed)
History     CSN: [id number]  Arrival date and time: 11/25/13 1442   First Provider Initiated Contact with Patient 11/25/13 1641      Chief Complaint  Patient presents with  . Abdominal Pain  . Possible Pregnancy   HPI Comments: Sonya Garner 33 y.o. [id number] presents to MAU for LLQ pain that has been ongoing for one month. The pain is 5-6 on 1-10 scale. It comes and goes, and seems to be worse with standing at work. Same sexual partner. She had BTL years ago and has fear of tubal pregnancy. She has taken Motrin for pain and it helps.   Abdominal Pain  Possible Pregnancy Associated symptoms include abdominal pain.      Past Medical History  Diagnosis Date  . Chronic kidney disease     kidney stone    Past Surgical History  Procedure Laterality Date  . Tubal ligation      Family History  Problem Relation Age of Onset  . Cancer Maternal Aunt     breast  . Cancer Maternal Uncle   . Cancer Maternal Grandmother   . Hearing loss Neg Hx     History  Substance Use Topics  . Smoking status: Current Every Day Smoker -- 0.25 packs/day for 10 years  . Smokeless tobacco: Never Used  . Alcohol Use: No    Allergies: No Known Allergies  No prescriptions prior to admission    Review of Systems  Constitutional: Negative.   HENT: Negative.   Eyes: Negative.   Respiratory: Negative.   Cardiovascular: Negative.   Gastrointestinal: Positive for abdominal pain.  Genitourinary: Negative.   Musculoskeletal: Negative.   Skin: Negative.   Neurological: Negative.   Psychiatric/Behavioral: Negative.    Physical Exam   Blood pressure 126/74, pulse 100, temperature 98.9 F (37.2 C), resp. rate 16, height 5\' 3"  (1.6 m), weight 55.52 kg (122 lb 6.4 oz), last menstrual period 11/02/2013, SpO2 99.00%.  Physical Exam  Constitutional: She is oriented to person, place, and time. She appears well-developed and well-nourished. No distress.  HENT:  Head: Normocephalic and  atraumatic.  Eyes: Pupils are equal, round, and reactive to light.  Cardiovascular: Normal rate, regular rhythm and normal heart sounds.   Respiratory: Effort normal and breath sounds normal. No respiratory distress. She has no wheezes. She has no rales. She exhibits no tenderness.  GI: Soft. Bowel sounds are normal. She exhibits no distension and no mass. There is tenderness. There is no rebound and no guarding.  Genitourinary:  Genital:External negative Vaginal:small amount white discharge Cervix:closed/ thick/ no lesions Bimanual:tender in uterus and left ovary   Neurological: She is alert and oriented to person, place, and time.  Skin: Skin is warm and dry.  Psychiatric: She has a normal mood and affect. Her behavior is normal. Judgment and thought content normal.   Results for orders placed during the hospital encounter of 11/25/13 (from the past 24 hour(s))  URINALYSIS, ROUTINE W REFLEX MICROSCOPIC     Status: Abnormal   Collection Time    11/25/13  3:10 PM      Result Value Ref Range   Color, Urine YELLOW  YELLOW   APPearance CLEAR  CLEAR   Specific Gravity, Urine >1.030 (*) 1.005 - 1.030   pH 6.0  5.0 - 8.0   Glucose, UA NEGATIVE  NEGATIVE mg/dL   Hgb urine dipstick NEGATIVE  NEGATIVE   Bilirubin Urine NEGATIVE  NEGATIVE   Ketones, ur NEGATIVE  NEGATIVE mg/dL  Protein, ur NEGATIVE  NEGATIVE mg/dL   Urobilinogen, UA 0.2  0.0 - 1.0 mg/dL   Nitrite NEGATIVE  NEGATIVE   Leukocytes, UA NEGATIVE  NEGATIVE  POCT PREGNANCY, URINE     Status: None   Collection Time    11/25/13  3:26 PM      Result Value Ref Range   Preg Test, Ur NEGATIVE  NEGATIVE  CBC     Status: Abnormal   Collection Time    11/25/13  4:02 PM      Result Value Ref Range   WBC 4.4  4.0 - 10.5 K/uL   RBC 4.25  3.87 - 5.11 MIL/uL   Hemoglobin 10.8 (*) 12.0 - 15.0 g/dL   HCT 16.1 (*) 09.6 - 04.5 %   MCV 77.4 (*) 78.0 - 100.0 fL   MCH 25.4 (*) 26.0 - 34.0 pg   MCHC 32.8  30.0 - 36.0 g/dL   RDW 40.9 (*)  81.1 - 15.5 %   Platelets 236  150 - 400 K/uL  HCG, QUANTITATIVE, PREGNANCY     Status: None   Collection Time    11/25/13  4:03 PM      Result Value Ref Range   hCG, Beta Chain, Quant, S <1  <5 mIU/mL   US Transvaginal Non-ob  11/25/2013   CLINICAL DATA:  LEFT lower quadrant pain for 1 month ; LMP 11/02/2013  EXAM: TRANSABDOMINAL AND TRANSVAGINAL ULTRASOUND OF PELVIS  TECHNIQUE: Both transabdominal and transvaginal ultrasound examinations of the pelvis were performed. Transabdominal technique was performed for global imaging of the pelvis including uterus, ovaries, adnexal regions, and pelvic cul-de-sac. It was necessary to proceed with endovaginal exam following the transabdominal exam to visualize the endometrium and ovaries.  COMPARISON:  Obstetrical ultrasound 12/14/2005  FINDINGS: Uterus  Measurements: 10.1 x 6.5 x 7.5 cm. Retroflexed. Small leiomyomata noted upper uterine segment, 12 mm and 10 mm greatest size is. No focal abnormalities.  Endometrium  Thickness: 15 mm , upper normal thickness. No endometrial fluid or focal abnormality.  Right ovary  Measurements: 4.0 x 2.3 x 2.7 cm. Normal morphology without mass. Internal blood flow present within RIGHT ovary on color Doppler imaging.  Left ovary  Measurements: 4.2 x 2.6 x 3.1 cm. Normal morphology without mass. Internal blood flow present on color Doppler imaging.  Other findings  No free pelvic fluid or adnexal masses.  IMPRESSION: Retroflexed uterus with upper normal endometrial thickness.  Otherwise negative exam.   Electronically Signed   By: Ulyses Southward M.D.   On: 11/25/2013 18:31   US Pelvis Complete  11/25/2013   CLINICAL DATA:  LEFT lower quadrant pain for 1 month ; LMP 11/02/2013  EXAM: TRANSABDOMINAL AND TRANSVAGINAL ULTRASOUND OF PELVIS  TECHNIQUE: Both transabdominal and transvaginal ultrasound examinations of the pelvis were performed. Transabdominal technique was performed for global imaging of the pelvis including uterus, ovaries,  adnexal regions, and pelvic cul-de-sac. It was necessary to proceed with endovaginal exam following the transabdominal exam to visualize the endometrium and ovaries.  COMPARISON:  Obstetrical ultrasound 12/14/2005  FINDINGS: Uterus  Measurements: 10.1 x 6.5 x 7.5 cm. Retroflexed. Small leiomyomata noted upper uterine segment, 12 mm and 10 mm greatest size is. No focal abnormalities.  Endometrium  Thickness: 15 mm , upper normal thickness. No endometrial fluid or focal abnormality.  Right ovary  Measurements: 4.0 x 2.3 x 2.7 cm. Normal morphology without mass. Internal blood flow present within RIGHT ovary on color Doppler imaging.  Left ovary  Measurements: 4.2  x 2.6 x 3.1 cm. Normal morphology without mass. Internal blood flow present on color Doppler imaging.  Other findings  No free pelvic fluid or adnexal masses.  IMPRESSION: Retroflexed uterus with upper normal endometrial thickness.  Otherwise negative exam.   Electronically Signed   By: Ulyses SouthwardMark  Boles M.D.   On: 11/25/2013 18:31     MAU Course  Procedures  MDM CBC, Quant, UA Pelvic ultrasound  Assessment and Plan   A: Pelvic pain  P: Advised may want to see GI / PCP Advised to watch diet and medications F/U prn  Sonya AlbinoLinda M Jmari Pelc 11/25/2013, 5:12 PM

## 2013-11-25 NOTE — MAU Note (Signed)
LLQ- tightening, sometimes sharp - noted past couple wks.  Comes and goes.

## 2013-11-26 LAB — GC/CHLAMYDIA PROBE AMP
CT PROBE, AMP APTIMA: NEGATIVE
GC PROBE AMP APTIMA: NEGATIVE

## 2014-05-17 ENCOUNTER — Encounter (HOSPITAL_COMMUNITY): Payer: Self-pay | Admitting: *Deleted

## 2015-04-11 ENCOUNTER — Emergency Department (INDEPENDENT_AMBULATORY_CARE_PROVIDER_SITE_OTHER): Payer: Worker's Compensation

## 2015-04-11 ENCOUNTER — Emergency Department (INDEPENDENT_AMBULATORY_CARE_PROVIDER_SITE_OTHER)
Admission: EM | Admit: 2015-04-11 | Discharge: 2015-04-11 | Disposition: A | Discharge status: Home / Self Care | Payer: Worker's Compensation | Source: Home / Self Care | Attending: Family Medicine | Admitting: Family Medicine

## 2015-04-11 ENCOUNTER — Encounter (HOSPITAL_COMMUNITY): Payer: Self-pay | Admitting: *Deleted

## 2015-04-11 DIAGNOSIS — S9032XA Contusion of left foot, initial encounter: Secondary | ICD-10-CM

## 2015-04-11 LAB — POCT PREGNANCY, URINE: Preg Test, Ur: NEGATIVE

## 2015-04-11 NOTE — ED Notes (Signed)
Pt  Reports    A  pallett  Fell on  Her  l  Foot  Tonight  Pain  /    Swelling noted

## 2015-04-11 NOTE — Discharge Instructions (Signed)
Ice and advil as needed. Ok to work as scheduled.

## 2015-04-11 NOTE — ED Provider Notes (Addendum)
CSN: [id number]     Arrival date & time 04/11/15  1923 History   First MD Initiated Contact with Patient 04/11/15 2044     Chief Complaint  Patient presents with  . Foot Injury   (Consider location/radiation/quality/duration/timing/severity/associated sxs/prior Treatment) Patient is a 34 y.o. female presenting with foot injury. The history is provided by the patient and a friend.  Foot Injury Location:  Foot Time since incident:  7 hours Injury: yes   Mechanism of injury comment:   a food pallett rolled over left foot around 2pm at work Foot location:  [place] of L foot Pain details:    Quality:  Sharp   Radiates to:  Does not radiate   Severity:  Mild   Progression:  Unchanged Chronicity:  New Dislocation: no   Foreign body present:  No foreign bodies Prior injury to area:  No Relieved by:  None tried Worsened by:  Nothing tried Ineffective treatments:  None tried   Past Medical History  Diagnosis Date  . Chronic kidney disease     kidney stone   Past Surgical History  Procedure Laterality Date  . Tubal ligation     Family History  Problem Relation Age of Onset  . Cancer Maternal Aunt     breast  . Cancer Maternal Uncle   . Cancer Maternal Grandmother   . Hearing loss Neg Hx    Social History  Substance Use Topics  . Smoking status: Current Every Day Smoker -- 0.25 packs/day for 10 years  . Smokeless tobacco: Never Used  . Alcohol Use: No   OB History    Gravida Para Term Preterm AB TAB SAB Ectopic Multiple Living   0 0 0 0 0 0 4     Review of Systems  Musculoskeletal: Positive for gait problem. Negative for joint swelling.  Skin: Negative.   All other systems reviewed and are negative.   Allergies  Review of patient's allergies indicates no known allergies.  Home Medications   Prior to Admission medications   Not on File   Meds Ordered and Administered this Visit  Medications - No data to display  BP 106/63 mmHg  Pulse 89  Temp(Src)  98.6 F (37 C) (Oral)  Resp 18  SpO2 100%  LMP 02/18/2015 No data found.   Physical Exam  Constitutional: She is oriented to person, place, and time. She appears well-developed and well-nourished.  Musculoskeletal: Normal range of motion. She exhibits tenderness.       Feet:  Neurological: She is alert and oriented to person, place, and time.  Skin: Skin is warm and dry.  Nursing note and vitals reviewed.   ED Course  Procedures (including critical care time)  Labs Review Labs Reviewed  POCT PREGNANCY, URINE    Imaging Review Dg Foot Complete Left  04/11/2015   CLINICAL DATA:  Foot pain. Initial encounter. Injury today. Lateral foot pain.  EXAM: LEFT FOOT - COMPLETE 3+ VIEW  COMPARISON:  None.  FINDINGS: There is no evidence of fracture or dislocation. There is no evidence of arthropathy or other focal bone abnormality. Soft tissues are unremarkable.  IMPRESSION: Negative.   Electronically Signed   By: Andreas Newport M.D.   On: 04/11/2015 21:11     Visual Acuity Review  Right Eye Distance:   Left Eye Distance:   Bilateral Distance:    Right Eye Near:   Left Eye Near:    Bilateral Near:  MDM   1. Contusion of left foot, initial encounter        Linna Hoff, MD 04/11/15 2121  Linna Hoff, MD 04/11/15 2122

## 2015-06-13 ENCOUNTER — Encounter (HOSPITAL_COMMUNITY): Payer: Self-pay | Admitting: *Deleted

## 2015-06-13 ENCOUNTER — Inpatient Hospital Stay (HOSPITAL_COMMUNITY)
Admission: AD | Admit: 2015-06-13 | Discharge: 2015-06-13 | Disposition: A | Discharge status: Home / Self Care | Payer: Self-pay | Source: Ambulatory Visit | Attending: Family Medicine | Admitting: Family Medicine

## 2015-06-13 DIAGNOSIS — Z3202 Encounter for pregnancy test, result negative: Secondary | ICD-10-CM | POA: Insufficient documentation

## 2015-06-13 DIAGNOSIS — R1031 Right lower quadrant pain: Secondary | ICD-10-CM

## 2015-06-13 DIAGNOSIS — Z87442 Personal history of urinary calculi: Secondary | ICD-10-CM | POA: Insufficient documentation

## 2015-06-13 DIAGNOSIS — R1032 Left lower quadrant pain: Secondary | ICD-10-CM

## 2015-06-13 DIAGNOSIS — F17200 Nicotine dependence, unspecified, uncomplicated: Secondary | ICD-10-CM | POA: Insufficient documentation

## 2015-06-13 DIAGNOSIS — R109 Unspecified abdominal pain: Secondary | ICD-10-CM | POA: Insufficient documentation

## 2015-06-13 LAB — URINALYSIS, ROUTINE W REFLEX MICROSCOPIC
Bilirubin Urine: NEGATIVE
Glucose, UA: NEGATIVE mg/dL
Hgb urine dipstick: NEGATIVE
KETONES UR: NEGATIVE mg/dL
Leukocytes, UA: NEGATIVE
NITRITE: NEGATIVE
PH: 6 (ref 5.0–8.0)
Protein, ur: NEGATIVE mg/dL

## 2015-06-13 LAB — WET PREP, GENITAL
Clue Cells Wet Prep HPF POC: NONE SEEN
SPERM: NONE SEEN
TRICH WET PREP: NONE SEEN
YEAST WET PREP: NONE SEEN

## 2015-06-13 LAB — POCT PREGNANCY, URINE: PREG TEST UR: NEGATIVE

## 2015-06-13 MED ORDER — IBUPROFEN 600 MG PO TABS
600.0000 mg | ORAL_TABLET | Freq: Once | ORAL | Status: AC
  Administered 2015-06-13: 600 mg via ORAL
  Filled 2015-06-13: qty 1

## 2015-06-13 MED ORDER — IBUPROFEN 600 MG PO TABS: 600.0000 mg | ORAL_TABLET | Freq: Four times a day (QID) | ORAL | Status: DC | PRN

## 2015-06-13 NOTE — MAU Provider Note (Signed)
History     CSN: [id number]  Arrival date and time: 06/13/15 [id number]   First Provider Initiated Contact with Patient 06/13/15 0915      Chief Complaint  Patient presents with  . Abdominal Pain  . Back Pain  . Possible Pregnancy   HPI    [name] is a 34 y.o. female [id number] with a history of a tubal ligation, presents to MAU with concerns about pregnacy. She has been dreaming about fish and was told by her family member that she is likely pregnant.   Her menstrual cycle is due to start on December 1; she has not missed a period.  Last month her period was a few days late; periods are usually normal.   Has not taken anything for pain.   OB History    Gravida Para Term Preterm AB TAB SAB Ectopic Multiple Living   4 4 4  0 0 0 0 0 0 4      Past Medical History  Diagnosis Date  . Chronic kidney disease     kidney stone    Past Surgical History  Procedure Laterality Date  . Tubal ligation      Family History  Problem Relation Age of Onset  . Cancer Maternal Aunt     breast  . Cancer Maternal Uncle   . Cancer Maternal Grandmother   . Hearing loss Neg Hx     Social History  Substance Use Topics  . Smoking status: Current Every Day Smoker -- 0.25 packs/day for 10 years  . Smokeless tobacco: Never Used  . Alcohol Use: No    Allergies: No Known Allergies  Prescriptions prior to admission  Medication Sig Dispense Refill Last Dose  . naproxen sodium (ANAPROX) 220 MG tablet Take 220 mg by mouth as needed (pain).   Past Week at Unknown time   Results for orders placed or performed during the hospital encounter of 06/13/15 (from the past 48 hour(s))  Urinalysis, Routine w reflex microscopic (not [facility])     Status: Abnormal   Collection Time: 06/13/15  8:45 AM  Result Value Ref Range   Color, Urine YELLOW YELLOW   APPearance CLEAR CLEAR   Specific Gravity, Urine >1.030 (H) 1.005 - 1.030   pH 6.0 5.0 - 8.0   Glucose, UA NEGATIVE NEGATIVE mg/dL   Hgb  urine dipstick NEGATIVE NEGATIVE   Bilirubin Urine NEGATIVE NEGATIVE   Ketones, ur NEGATIVE NEGATIVE mg/dL   Protein, ur NEGATIVE NEGATIVE mg/dL   Nitrite NEGATIVE NEGATIVE   Leukocytes, UA NEGATIVE NEGATIVE    Comment: MICROSCOPIC NOT DONE ON URINES WITH NEGATIVE PROTEIN, BLOOD, LEUKOCYTES, NITRITE, OR GLUCOSE <1000 mg/dL.  Pregnancy, urine POC     Status: None   Collection Time: 06/13/15  8:48 AM  Result Value Ref Range   Preg Test, Ur NEGATIVE NEGATIVE    Comment:        THE SENSITIVITY OF THIS METHODOLOGY IS >24 mIU/mL   Wet prep, genital     Status: Abnormal   Collection Time: 06/13/15  9:22 AM  Result Value Ref Range   Yeast Wet Prep HPF POC NONE SEEN NONE SEEN   Trich, Wet Prep NONE SEEN NONE SEEN   Clue Cells Wet Prep HPF POC NONE SEEN NONE SEEN   WBC, Wet Prep HPF POC MANY (A) NONE SEEN    Comment: MANY BACTERIA SEEN   Sperm NONE SEEN     Review of Systems  Constitutional: Negative for fever and chills.  Gastrointestinal: Positive for nausea, vomiting and abdominal pain (Bilateral lower abdominal pain; comes and goes. Currently rates it 5/10. cramping/ sharp. ). Negative for diarrhea and constipation.  Genitourinary: Negative for dysuria.       Denies abnormal vaginal discharge.    Physical Exam   Blood pressure 112/70, pulse 96, temperature 98.4 F (36.9 C), temperature source Oral, resp. rate 16, last menstrual period 05/20/2015.  Physical Exam  Constitutional: She is oriented to person, place, and time. She appears well-developed and well-nourished. No distress.  HENT:  Head: Normocephalic.  Eyes: Pupils are equal, round, and reactive to light.  Neck: Neck supple.  Respiratory: Effort normal.  GI: Soft. She exhibits no distension and no mass. There is no tenderness. There is no rebound, no guarding and no CVA tenderness.  Genitourinary:  Wet prep and GC collected without speculum.   Musculoskeletal: Normal range of motion.  Neurological: She is alert and  oriented to person, place, and time.  Skin: Skin is warm. She is not diaphoretic.  Psychiatric: Her behavior is normal.    MAU Course  Procedures  None  MDM  Ibuprofen 600 mg PO given Patient rates her pain 0/10 at the time of discharge.  Many WBC's on wet prep, no CMT. GC pending. Patient has one partner.   Assessment and Plan   A:  1. Encounter for pregnancy test with result negative   2. Bilateral lower abdominal cramping    P:  Discharge home in stable condition At home pregnancy tests encouraged for future Ok to take ibuprofen, as directed on the bottle.  Return to MAU for emergencies, or if symptoms worsen   Duane Lope, NP 06/13/2015 1:02 PM

## 2015-06-13 NOTE — MAU Note (Signed)
Pt states abd pain and back pain started 3 weeks ago.  Pt states she has also been vomiting every other day for last 2 weeks.  Pt states she was told by Grandmother and Mom to come be seen since they have been dreaming about fish.  Pt states usually when someone dreams about fish then a person is pregnant.  Pt also states her urine pregnancy test are never positive until about 5 months.  Denies vaginal bleeding or abnormal discharge.

## 2015-06-13 NOTE — Discharge Instructions (Signed)

## 2015-06-14 LAB — GC/CHLAMYDIA PROBE AMP (~~LOC~~) NOT AT ARMC
Chlamydia: NEGATIVE
Neisseria Gonorrhea: NEGATIVE

## 2016-01-11 ENCOUNTER — Encounter (HOSPITAL_COMMUNITY): Payer: Self-pay | Admitting: Emergency Medicine

## 2016-01-11 DIAGNOSIS — F17200 Nicotine dependence, unspecified, uncomplicated: Secondary | ICD-10-CM | POA: Insufficient documentation

## 2016-01-11 DIAGNOSIS — L03114 Cellulitis of left upper limb: Secondary | ICD-10-CM | POA: Insufficient documentation

## 2016-01-11 DIAGNOSIS — N189 Chronic kidney disease, unspecified: Secondary | ICD-10-CM | POA: Insufficient documentation

## 2016-01-11 NOTE — ED Notes (Signed)
Pt states yesterday afternoon her left forearm starting burning and she looked down and noticed something had bite her. Tonight pts left arm has two bite marks and is red and tender. Pt has swelling in forearm up to left elbow.

## 2016-01-12 ENCOUNTER — Emergency Department (HOSPITAL_COMMUNITY)
Admission: EM | Admit: 2016-01-12 | Discharge: 2016-01-12 | Disposition: A | Discharge status: Home / Self Care | Payer: Self-pay | Attending: Emergency Medicine | Admitting: Emergency Medicine

## 2016-01-12 DIAGNOSIS — L03114 Cellulitis of left upper limb: Secondary | ICD-10-CM

## 2016-01-12 MED ORDER — DOXYCYCLINE HYCLATE 100 MG PO TABS
100.0000 mg | ORAL_TABLET | Freq: Once | ORAL | Status: AC
  Administered 2016-01-12: 100 mg via ORAL
  Filled 2016-01-12: qty 1

## 2016-01-12 MED ORDER — DOXYCYCLINE HYCLATE 100 MG PO CAPS: 100.0000 mg | ORAL_CAPSULE | Freq: Two times a day (BID) | ORAL | Status: DC

## 2016-01-12 MED ORDER — IBUPROFEN 800 MG PO TABS
800.0000 mg | ORAL_TABLET | Freq: Once | ORAL | Status: AC
  Administered 2016-01-12: 800 mg via ORAL
  Filled 2016-01-12: qty 1

## 2016-01-12 NOTE — Discharge Instructions (Signed)
Cellulitis Cellulitis is an infection of the skin and the tissue beneath it. The infected area is usually red and tender. Cellulitis occurs most often in the arms and lower legs.  CAUSES  Cellulitis is caused by bacteria that enter the skin through cracks or cuts in the skin. The most common types of bacteria that cause cellulitis are staphylococci and streptococci. SIGNS AND SYMPTOMS   Redness and warmth.  Swelling.  Tenderness or pain.  Fever. DIAGNOSIS  Your health care provider can usually determine what is wrong based on a physical exam. Blood tests may also be done. TREATMENT  Treatment usually involves taking an antibiotic medicine. HOME CARE INSTRUCTIONS   Take your antibiotic medicine as directed by your health care provider. Finish the antibiotic even if you start to feel better.  Keep the infected arm or leg elevated to reduce swelling.  Apply a warm cloth to the affected area up to 4 times per day to relieve pain.  Take medicines only as directed by your health care provider.  Keep all follow-up visits as directed by your health care provider. SEEK MEDICAL CARE IF:   You notice red streaks coming from the infected area.  Your red area gets larger or turns dark in color.  Your bone or joint underneath the infected area becomes painful after the skin has healed.  Your infection returns in the same area or another area.  You notice a swollen bump in the infected area.  You develop new symptoms.  You have a fever. SEEK IMMEDIATE MEDICAL CARE IF:   You feel very sleepy.  You develop vomiting or diarrhea.  You have a general ill feeling (malaise) with muscle aches and pains.   This information is not intended to replace advice given to you by your health care provider. Make sure you discuss any questions you have with your health care provider.   Document Released: 04/11/2005 Document Revised: 03/23/2015 Document Reviewed: 09/17/2011 Elsevier Interactive  Patient Education 2016 [employer]  Doxycycline tablets or capsules What is this medicine? DOXYCYCLINE (dox i SYE kleen) is a tetracycline antibiotic. It kills certain bacteria or stops their growth. It is used to treat many kinds of infections, like dental, skin, respiratory, and urinary tract infections. It also treats acne, Lyme disease, malaria, and certain sexually transmitted infections. This medicine may be used for other purposes; ask your health care provider or pharmacist if you have questions. What should I tell my health care provider before I take this medicine? They need to know if you have any of these conditions: -liver disease -long exposure to sunlight like working outdoors -stomach problems like colitis -an unusual or allergic reaction to doxycycline, tetracycline antibiotics, other medicines, foods, dyes, or preservatives -pregnant or trying to get pregnant -breast-feeding How should I use this medicine? Take this medicine by mouth with a full glass of water. Follow the directions on the prescription label. It is best to take this medicine without food, but if it upsets your stomach take it with food. Take your medicine at regular intervals. Do not take your medicine more often than directed. Take all of your medicine as directed even if you think you are better. Do not skip doses or stop your medicine early. Talk to your pediatrician regarding the use of this medicine in children. While this drug may be prescribed for selected conditions, precautions do apply. Overdosage: If you think you have taken too much of this medicine contact a poison control center or emergency room  at once. NOTE: This medicine is only for you. Do not share this medicine with others. What if I miss a dose? If you miss a dose, take it as soon as you can. If it is almost time for your next dose, take only that dose. Do not take double or extra doses. What may interact with this  medicine? -antacids -barbiturates -birth control pills -bismuth subsalicylate -carbamazepine -methoxyflurane -other antibiotics -phenytoin -vitamins that contain iron -warfarin This list may not describe all possible interactions. Give your health care provider a list of all the medicines, herbs, non-prescription drugs, or dietary supplements you use. Also tell them if you smoke, drink alcohol, or use illegal drugs. Some items may interact with your medicine. What should I watch for while using this medicine? Tell your doctor or health care professional if your symptoms do not improve. Do not treat diarrhea with over the counter products. Contact your doctor if you have diarrhea that lasts more than 2 days or if it is severe and watery. Do not take this medicine just before going to bed. It may not dissolve properly when you lay down and can cause pain in your throat. Drink plenty of fluids while taking this medicine to also help reduce irritation in your throat. This medicine can make you more sensitive to the sun. Keep out of the sun. If you cannot avoid being in the sun, wear protective clothing and use sunscreen. Do not use sun lamps or tanning beds/booths. Birth control pills may not work properly while you are taking this medicine. Talk to your doctor about using an extra method of birth control. If you are being treated for a sexually transmitted infection, avoid sexual contact until you have finished your treatment. Your sexual partner may also need treatment. Avoid antacids, aluminum, calcium, magnesium, and iron products for 4 hours before and 2 hours after taking a dose of this medicine. If you are using this medicine to prevent malaria, you should still protect yourself from contact with mosquitos. Stay in screened-in areas, use mosquito nets, keep your body covered, and use an insect repellent. What side effects may I notice from receiving this medicine? Side effects that you  should report to your doctor or health care professional as soon as possible: -allergic reactions like skin rash, itching or hives, swelling of the face, lips, or tongue -difficulty breathing -fever -itching in the rectal or genital area -pain on swallowing -redness, blistering, peeling or loosening of the skin, including inside the mouth -severe stomach pain or cramps -unusual bleeding or bruising -unusually weak or tired -yellowing of the eyes or skin Side effects that usually do not require medical attention (report to your doctor or health care professional if they continue or are bothersome): -diarrhea -loss of appetite -nausea, vomiting This list may not describe all possible side effects. Call your doctor for medical advice about side effects. You may report side effects to FDA at 1-800-FDA-1088. Where should I keep my medicine? Keep out of the reach of children. Store at room temperature, below 30 degrees C (86 degrees F). Protect from light. Keep container tightly closed. Throw away any unused medicine after the expiration date. Taking this medicine after the expiration date can make you seriously ill. NOTE: This sheet is a summary. It may not cover all possible information. If you have questions about this medicine, talk to your doctor, pharmacist, or health care provider.    2016, Elsevier/[employer] Standard. (2014-10-22 12:10:28)

## 2016-01-12 NOTE — ED Provider Notes (Signed)
CSN: [id number]     Arrival date & time 01/11/16  2254 History  By signing my name below, I, Phillis HaggisGabriella Gaje, attest that this documentation has been prepared under the direction and in the presence of Dione Boozeavid Jamar Weatherall, MD. Electronically Signed: Phillis HaggisGabriella Gaje, ED Scribe. 01/12/2016. 1:16 AM.   Chief Complaint  Patient presents with  . Insect Bite   The history is provided by the patient. No language interpreter was used.  HPI Comments: Sonya Garner is a 35 y.o. female with a hx of CKD who presents to the Emergency Department complaining of a gradually worsening, itching rash to the left forearm onset one day ago. She reports associated burning sensation to the arm, swelling going up the arm, and chills. She currently rates her pain 6/10. Pt states that she was bitten by an unknown insect while outside. She initially thought that it was a mosquito bite, but it continued to worsen. She has tried ice on the area to no relief. She denies fever, nausea, vomiting, chest pain, SOB, or trouble swallowing. Pt does not have a PCP, but typically is seen at the health department.   Past Medical History  Diagnosis Date  . Chronic kidney disease     kidney stone   Past Surgical History  Procedure Laterality Date  . Tubal ligation     Family History  Problem Relation Age of Onset  . Cancer Maternal Aunt     breast  . Cancer Maternal Uncle   . Cancer Maternal Grandmother   . Hearing loss Neg Hx    Social History  Substance Use Topics  . Smoking status: Current Every Day Smoker -- 0.25 packs/day for 10 years  . Smokeless tobacco: Never Used  . Alcohol Use: No   OB History    Gravida Para Term Preterm AB TAB SAB Ectopic Multiple Living   4 4 4  0 0 0 0 0 0 4     Review of Systems  Constitutional: Positive for chills. Negative for fever.  HENT: Negative for trouble swallowing.   Respiratory: Negative for shortness of breath.   Cardiovascular: Negative for chest pain.  Gastrointestinal:  Negative for nausea and vomiting.  Skin: Positive for rash.  All other systems reviewed and are negative.  Allergies  Review of patient's allergies indicates no known allergies.  Home Medications   Prior to Admission medications   Medication Sig Start Date End Date Taking? Authorizing Provider  ibuprofen (ADVIL,MOTRIN) 600 MG tablet Take 1 tablet (600 mg total) by mouth every 6 (six) hours as needed. 06/13/15   Harolyn RutherfordJennifer I Rasch, NP   BP 115/81 mmHg  Pulse 65  Temp(Src) 98 F (36.7 C) (Oral)  Resp 19  Ht 5\' 4"  (1.626 m)  Wt 122 lb 3 oz (55.424 kg)  BMI 20.96 kg/m2  SpO2 100%  LMP 01/11/2016 Physical Exam  Constitutional: She is oriented to person, place, and time. She appears well-developed and well-nourished.  HENT:  Head: Normocephalic and atraumatic.  Eyes: EOM are normal. Pupils are equal, round, and reactive to light.  Neck: Normal range of motion. Neck supple. No JVD present.  Cardiovascular: Normal rate, regular rhythm and normal heart sounds.  Exam reveals no gallop and no friction rub.   No murmur heard. Pulmonary/Chest: Effort normal and breath sounds normal. She has no wheezes. She has no rales. She exhibits no tenderness.  Abdominal: Soft. Bowel sounds are normal. She exhibits no distension and no mass. There is no tenderness.  Musculoskeletal: Normal range  of motion. She exhibits no edema.  2 lesions on the volar surface of the left forearm, each 1.5 cm in diameter, erythematous, no fluctuance, no induration; slight soft tissue swelling of the medial surface of left elbow, no lymphangitic streaks, no axillary or epitrochlear lymph nodes  Lymphadenopathy:    She has no cervical adenopathy.  Neurological: She is alert and oriented to person, place, and time. No cranial nerve deficit. She exhibits normal muscle tone. Coordination normal.  Skin: Skin is warm and dry.  Psychiatric: She has a normal mood and affect. Her behavior is normal. Judgment and thought content  normal.  Nursing note and vitals reviewed.   ED Course  Procedures (including critical care time) DIAGNOSTIC STUDIES: Oxygen Saturation is 100% on RA, normal by my interpretation.    COORDINATION OF CARE: 1:11 AM-Discussed treatment plan which includes anti-biotics with pt at bedside and pt agreed to plan.    MDM   Final diagnoses:  Cellulitis of left forearm    2 lesions of the left forearm which appear to have cellulitis. Appearance is consistent with arthropod bites but surrounding area is clearly infectious. Swelling of the left elbow is concerning but no evidence of joint infection. She is placed on antibiotics-doxycycline-and advised to use warm compresses. Told to use over-the-counter analgesics as needed for pain. Follow-up in 2 days for recheck. Return if infection seems to be spreading before then.  I personally performed the services described in this documentation, which was scribed in my presence. The recorded information has been reviewed and is accurate.       Dione Boozeavid Zamiya Dillard, MD 01/12/16 [id number]

## 2016-01-12 NOTE — ED Notes (Signed)
Dr. Glick at bedside at this time  

## 2016-09-11 ENCOUNTER — Ambulatory Visit: Payer: Self-pay | Admitting: Podiatry

## 2016-09-11 ENCOUNTER — Encounter: Payer: Self-pay | Admitting: Podiatry

## 2016-10-02 ENCOUNTER — Encounter (INDEPENDENT_AMBULATORY_CARE_PROVIDER_SITE_OTHER): Payer: Self-pay | Admitting: Podiatry

## 2016-10-02 NOTE — Progress Notes (Signed)
This encounter was created in error - please disregard.

## 2016-11-23 ENCOUNTER — Emergency Department (HOSPITAL_COMMUNITY)
Admission: EM | Admit: 2016-11-23 | Discharge: 2016-11-23 | Disposition: A | Discharge status: Home / Self Care | Payer: No Typology Code available for payment source | Attending: Emergency Medicine | Admitting: Emergency Medicine

## 2016-11-23 ENCOUNTER — Encounter (HOSPITAL_COMMUNITY): Payer: Self-pay

## 2016-11-23 ENCOUNTER — Ambulatory Visit (HOSPITAL_COMMUNITY)
Admission: EM | Admit: 2016-11-23 | Discharge: 2016-11-23 | Disposition: A | Discharge status: Nursing Home (Includes ICF) | Payer: Self-pay

## 2016-11-23 DIAGNOSIS — N189 Chronic kidney disease, unspecified: Secondary | ICD-10-CM | POA: Diagnosis not present

## 2016-11-23 DIAGNOSIS — Y939 Activity, unspecified: Secondary | ICD-10-CM | POA: Insufficient documentation

## 2016-11-23 DIAGNOSIS — Y92410 Unspecified street and highway as the place of occurrence of the external cause: Secondary | ICD-10-CM | POA: Diagnosis not present

## 2016-11-23 DIAGNOSIS — Z79899 Other long term (current) drug therapy: Secondary | ICD-10-CM | POA: Diagnosis not present

## 2016-11-23 DIAGNOSIS — F17200 Nicotine dependence, unspecified, uncomplicated: Secondary | ICD-10-CM | POA: Insufficient documentation

## 2016-11-23 DIAGNOSIS — S060X0A Concussion without loss of consciousness, initial encounter: Secondary | ICD-10-CM | POA: Insufficient documentation

## 2016-11-23 DIAGNOSIS — Y999 Unspecified external cause status: Secondary | ICD-10-CM | POA: Diagnosis not present

## 2016-11-23 DIAGNOSIS — S0990XA Unspecified injury of head, initial encounter: Secondary | ICD-10-CM | POA: Diagnosis present

## 2016-11-23 MED ORDER — NAPROXEN 500 MG PO TABS
500.0000 mg | ORAL_TABLET | Freq: Two times a day (BID) | ORAL | 0 refills | Status: DC
Start: 2016-11-23 — End: 2017-11-03

## 2016-11-23 MED ORDER — ACETAMINOPHEN 500 MG PO TABS: 1000.0000 mg | ORAL_TABLET | Freq: Once | ORAL | Status: DC

## 2016-11-23 MED ORDER — ONDANSETRON HCL 4 MG PO TABS: 4.0000 mg | ORAL_TABLET | Freq: Three times a day (TID) | ORAL | 0 refills | Status: DC | PRN

## 2016-11-23 MED ORDER — IBUPROFEN 800 MG PO TABS: 800.0000 mg | ORAL_TABLET | Freq: Once | ORAL | Status: DC
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
## 2016-11-28 ENCOUNTER — Telehealth: Payer: Self-pay
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
## 2016-11-30 NOTE — Progress Notes (Deleted)
Subjective:     Chief Complaint: Sonya Garner, DOB: 1980-09-21, is a 36 y.o. female who presents for No chief complaint on file.   Injury date : *** Visit #: ***  History of Present Illness:   Patient's goals/priorities: ***  CLASS CURRENT GRADE COMMENTS                               Concussion Self-Reported Symptom Score Symptoms rated on a scale 1-6, in last 24 hours  Headache: ***    Nausea: ***  Vomiting: ***  Balance Difficulty: ***   Dizziness: ***  Fatigue: ***  Trouble Falling Asleep: ***   Sleep More Than Usual: ***  Sleep Less Than Usual: ***  Daytime Drowsiness: ***  Photophobia: ***  Phonophobia: ***  Feeling anxious: ***  Confused: ***  Irritability: ***  Sadness: ***  Nervousness: ***  Feeling More Emotional: ***  Numbness or Tingling: ***  Feeling Slowed Down: ***  Feeling Mentally Foggy: ***  Difficulty Concentrating: ***  Difficulty Remembering: ***  Visual Problems: ***  Neck Pain: ***  Tinnitus: ***   Total Symptom Score: *** Previous Symptom Score: ***  Review of Systems: Pertinent items are noted in HPI.  Review of History: Past Medical History: @[facility] @  Past Surgical History:  has a past surgical history that includes Tubal ligation. Family History: family history includes Cancer in her maternal aunt, maternal grandmother, and maternal uncle. Social History:  reports that she has been smoking.  She has a 2.50 pack-year smoking history. She has never used smokeless tobacco. She reports that she does not drink alcohol or use drugs. Current Medications: has a current medication list which includes the following prescription(s): doxycycline, ibuprofen, naproxen, and ondansetron. Allergies: has No Known Allergies.  Objective:    Physical Examination There were no vitals filed for this visit. General appearance: alert, appears stated age and cooperative Head: Normocephalic, without obvious abnormality, atraumatic Eyes:  conjunctivae/corneas clear. PERRL, EOM's intact. Fundi benign. Sclera anicteric. Lungs: clear to auscultation bilaterally and percussion Heart: regular rate and rhythm, S1, S2 normal, no murmur, click, rub or gallop Neurologic: CN 2-12 normal.  Sensation to pain, touch, and proprioception normal.  DTRs  normal in upper and lower extremities. No pathologic reflexes. Neg rhomberg, modified rhomberg, pronator drift, tandem gait, finger-to-nose; see post-concussion vestibular and oculomotor testing in chart Psychiatric: Oriented X3, intact recent and remote memory, judgement and insight, normal mood and affect  Concussion testing performed today:  Neurocognitive testing (ImPACT):  Baseline:*** Post #1: ***   Verbal Memory Composite *** (***%) *** (***%)   Visual Memory Composite *** (***%) *** (***%)   Visual Motor Speed Composite *** (***%) *** (***%)   Reaction Time Composite *** (***%) *** (***%)   Cognitive Efficiency Index *** ***     Vestibular Screening:   Pre VOMS  HA Score: *** Pre VOMS  Dizziness Score: ***   Headache  Dizziness  Smooth Pursuits *** ***  H. Saccades *** ***  V. Saccades *** ***  H. VOR *** ***  V. VOR *** ***  Visual Motor Sensitivity *** ***  Accommodation Right: *** cm Left: *** cm *** ***  Convergence: *** cm Divergence: *** cm *** ***   Balance Screen: ***  Additional testing performed today: { :28529}   Assessment:    No diagnosis found.  Sonya Garner presents with the following concussion subtypes. [] Cognitive [] Cervical [] Vestibular [] Ocular [] Migraine [] Anxiety/Mood  Plan:   Action/Discussion: Reviewed diagnosis, management options, expected outcomes, and the reasons for scheduled and emergent follow-up. Questions were adequately answered. Patient expressed verbal understanding and agreement with the following plan.      Participation in school/work: Patient is cleared to return to work/school and activities of daily living  without restrictions.  Patient is not cleared to return to work/school until further notice.  Patient may return to work/school on ***, with the following restrictions/supports:  Length of Day:  Please allow patient to use *** class as study hall in a quiet area.  Shortened school/work day: Recommend *** until ***.  Recommend core classes only.  Extra Time:  Take mental rest breaks during the day as needed. Check for return of symptoms when participating in any activities that require a significant amount of attention or concentration.  Allow extra time to complete tasks.  Please allow *** weeks to make up missed assignments, test, quizzes.  Visual/Vestibular Accommodations in School:  Allow patient to eat lunch in quiet environment with 1-2 classmates.  Allow patient to leave class 5 minutes before end of period to avoid busy/noisy hallway.  Please provide any supplemental learning materials (power points, lecture notes, handouts, etc) in minimum size 18 font and allow/provide any auditory supplements to learning when possible (books on tape, audio tape lectures, etc) to limit visual stress in the classroom.  Patient is cleared for auditory participation only. Patient is not cleared for homework, quizzes, or tests at this time.   Testing:  May begin taking tests/quizzes on *** with no more than one test/quiz per day.   No significant classroom or standardized testing until ***.  Home/Extracurricular:  Lessen work/homework load to allow adequate cognitive rest. Work *** minutes with intervals of *** minute breaks (total *** hours).  Limit visual stimulants including: driving, watching television/movies, reading, using cell phone, etc. - to ensure relative visual cognitive rest. NOT cleared for video or phone games. May participate *** minutes with intervals of *** minute breaks (total *** hours).   Participation in physical activity: Patient is cleared to return to  physical activity participation without restrictions.  Patient is not cleared for formal physical activity (includes physical education class, sports practices, sports games, weight training, etc) at this time.   However, we recommend that patient has 20-30 minutes of light cardiovascular activity daily, with NO risk of head injury (example: walking), staying below level of symptoms.  Recommend gradual progression to *** vestibular exercise (30-45 min/day) with no risk of head trauma while staying below level of symptoms.  See your exercise treatment menu for details.   Begin your exercise prescription on ____________ (see separate exercise prescription form)  Cleared for physical activity that poses NO RISK of head trauma.   Gradual return to physical activity under the supervision of a physician and/or athletic trainer  Once asymptomatic for 24 hours, patient may start Stage *** of CFPSM's { :10024} Exercise Progression Protocol. This is to be monitored by patient's { :28383}    Patient may start Stage *** of CFPSM's { :10024} Exercise Progression Protocol. This is to be monitored by patient's { :28383}.   Patient is not cleared for full contact activities, activities with risk of head trauma or unsupervised physical activity while participating in CFPSM's Exercise Progression. Check for return of symptoms (using Concussion Education Form Symptom List) when participating in activity and 24 hours following. If symptoms return, patient to contact our office for further recommendations.    [facility] Medical Recommendations  form has been given to patient allowing *** to progress patient back into full participation.  Active Treatment Strategies:  Fueling your brain is important for recovery. It is essential to stay well hydrated, aiming for half of your body weight in fluid ounces per day (100 lbs = 50 oz). We also recommend eating breakfast to start your day and focus on a well-balanced diet  containing lean protein, 'good' fats, and complex carbohydrates. See your nutrition / hydration handout for more details.   Quality sleep is vital in your concussion recovery. We encourage lots of sleep for the first 24-72 hours after injury but following this period it is important to regulate your sleep cycle. We encourage *** hours of quality sleep per night. See your sleep handout for more details and strategies to quality sleep.  IF NOT USING THE OPTIONS BELOW DELETE THEM  Treating your vestibular and visual dysfunction will decrease your recovery time and improve your symptoms. Begin your home vestibular exercise program as directed on your AVS.    Begin taking Amantadine medicine as directed.   Begin taking DHA supplement as directed.    Begin home exercise program for neck as directed.   Follow-up information:  Follow up appointment at [facility] in *** .   Call [facility] at [phone] at least 24 hours after completion of Stage 4 with status update.  Patient needs to arrive 30 minutes prior to appointment to complete the following tests: { :28378}.    Patient Education:  Reviewed with patient the risks (i.e, a repeat concussion, post-concussion syndrome, second-impact syndrome) of returning to play prior to complete resolution, and thoroughly reviewed the signs and symptoms of concussion.Reviewed need for complete resolution of all symptoms, with rest AND exertion, prior to return to play.  Reviewed red flags for urgent medical evaluation: worsening symptoms, nausea/vomiting, intractable headache, musculoskeletal changes, focal neurological deficits.  [facility]'s Concussion Care Plan, which clearly outlines the plans stated above, was given to patient.  I was personally involved with the physical evaluation of and am in agreement with the assessment and treatment plan for this patient.  Greater than 50% of this encounter was spent  in direct consultation with the patient in evaluation, counseling, and coordination of care. Duration of encounter: { :28531} minutes.  After Visit Summary printed out and provided to patient as appropriate.  This note is written by Judi Saa, in the presence of and acting as the scribe of Judi Saa, DO.

## 2016-12-03 ENCOUNTER — Ambulatory Visit: Payer: Self-pay | Admitting: Family Medicine

## 2016-12-05 ENCOUNTER — Encounter: Payer: Self-pay | Admitting: Family Medicine

## 2016-12-05 ENCOUNTER — Ambulatory Visit (INDEPENDENT_AMBULATORY_CARE_PROVIDER_SITE_OTHER): Payer: Self-pay | Admitting: Family Medicine

## 2016-12-05 DIAGNOSIS — S060XAA Concussion with loss of consciousness status unknown, initial encounter: Secondary | ICD-10-CM | POA: Insufficient documentation

## 2016-12-05 DIAGNOSIS — S060X9A Concussion with loss of consciousness of unspecified duration, initial encounter: Secondary | ICD-10-CM | POA: Insufficient documentation

## 2016-12-05 DIAGNOSIS — S060X0A Concussion without loss of consciousness, initial encounter: Secondary | ICD-10-CM

## 2016-12-05 HISTORY — DX: Concussion with loss of consciousness status unknown, initial encounter: S06.0XAA

## 2016-12-05 NOTE — Patient Instructions (Signed)
Good to see you I do think you have a concussion.  No sport until you are back in school with no symptoms.  You may then start the return to play protocol I am giving you.  In addition to this I recommend......  To help improve COGNITIVE function: Using fish oil/omega 3 that is 1000 mg (or roughly 600 mg EPA/DHA), starting as soon as possible after concussion, take: 3 tabs ONCE DAILY  for the next 10 days then can go to 1 gram daily        Other medicines to help decrease inflammation Turmeric 500mg  twice daily  Also start iron 65mg  daily with 500mg  of vitamin C    I want to see you again in 3 weeks if not completely better
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
## 2016-12-05 NOTE — Assessment & Plan Note (Signed)
Patient's concussion was greater than 2 weeks ago. I do believe that this is mild overall. We discussed with her in great length. Patient did not do significantly well with the computer testing and did have some mild increase in symptoms afterwards. Discussed over-the-counter natural supplementation. Patient wants to continue working is artery been working for the last week. We discussed certain things such as iron deficiency his possibly causing increased risk of a postconcussive type syndrome. Patient will start this as well. Follow-up again in 3 weeks if not completely resolved.

## 2016-12-27 ENCOUNTER — Ambulatory Visit: Payer: Self-pay | Admitting: Family Medicine

## 2017-11-03 ENCOUNTER — Other Ambulatory Visit: Payer: Self-pay

## 2017-11-03 ENCOUNTER — Encounter (HOSPITAL_COMMUNITY): Payer: Self-pay

## 2017-11-03 ENCOUNTER — Ambulatory Visit (HOSPITAL_COMMUNITY)
Admission: EM | Admit: 2017-11-03 | Discharge: 2017-11-03 | Disposition: A | Discharge status: Home / Self Care | Payer: Self-pay | Attending: Urgent Care | Admitting: Urgent Care

## 2017-11-03 DIAGNOSIS — M79662 Pain in left lower leg: Secondary | ICD-10-CM

## 2017-11-03 DIAGNOSIS — L089 Local infection of the skin and subcutaneous tissue, unspecified: Secondary | ICD-10-CM

## 2017-11-03 DIAGNOSIS — S91012A Laceration without foreign body, left ankle, initial encounter: Secondary | ICD-10-CM

## 2017-11-03 DIAGNOSIS — M25572 Pain in left ankle and joints of left foot: Secondary | ICD-10-CM

## 2017-11-03 DIAGNOSIS — T148XXA Other injury of unspecified body region, initial encounter: Secondary | ICD-10-CM

## 2017-11-03 MED ORDER — CELECOXIB 100 MG PO CAPS: 100.0000 mg | ORAL_CAPSULE | Freq: Two times a day (BID) | ORAL | 0 refills | Status: DC

## 2017-11-03 MED ORDER — CEPHALEXIN 500 MG PO CAPS: 500.0000 mg | ORAL_CAPSULE | Freq: Three times a day (TID) | ORAL | 0 refills | Status: DC

## 2017-11-03 NOTE — ED Provider Notes (Signed)
  MRN: [id number] DOB: Dec 06, 1980  Subjective:   Lily LovingsRebecca M Zeitlin is a 37 y.o. female presenting for 3-day history of left lower leg laceration just behind her ankle.  Patient has scapular wound covered but has continued to perform her regular activities including work.  She stopped covering in the past day.  Now has pain all along her leg and finds it very difficult to walk.  She has not taken any medications for relief.  She is not on any chronic medications and has No Known Allergies. Denies history of kidney disease.    Past Medical History:  Diagnosis Date  . Chronic kidney disease    kidney stone     Past Surgical History:  Procedure Laterality Date  . TUBAL LIGATION      Objective:   Vitals: BP 119/77 (BP Location: Left Arm)   Pulse 85   Temp 98.4 F (36.9 C) (Oral)   Resp 17   LMP 10/13/2017 (Exact Date)   SpO2 99%   Physical Exam  Constitutional: She is oriented to person, place, and time. She appears well-developed and well-nourished.  Cardiovascular: Normal rate.  Pulmonary/Chest: Effort normal.  Musculoskeletal:       Left ankle: She exhibits laceration.       Feet:  Negative Homans sign, there is no warmth or erythema or lower leg edema.  Neurological: She is alert and oriented to person, place, and time.    Assessment and Plan :   Laceration of left ankle, initial encounter  Infected wound  Pain in left lower leg  Acute left ankle pain  Patient did not seek medical attention for her laceration for 4 days.  Counseled that we will need to use antibiotics given her pain and swelling.  Wound care reviewed with patient.  She denies history of kidney disease despite it being in her past medical history, states that she had a kidney stone once but is not working with a nephrologist.  Will use Keflex and Celebrex for management of her symptoms.  Return to clinic precautions discussed.   Wallis BambergMani, Beverley Allender, [place]PA-C 11/03/17 1952

## 2017-11-03 NOTE — ED Triage Notes (Signed)
[facility] x4 days [facility] that is now swelling and cause difficulty walking, pt has taken OTC medications but has no relief

## 2017-11-08 ENCOUNTER — Emergency Department (HOSPITAL_COMMUNITY)
Admission: EM | Admit: 2017-11-08 | Discharge: 2017-11-08 | Disposition: A | Discharge status: Home / Self Care | Payer: Self-pay | Attending: Emergency Medicine | Admitting: Emergency Medicine

## 2017-11-08 ENCOUNTER — Other Ambulatory Visit: Payer: Self-pay

## 2017-11-08 DIAGNOSIS — F17210 Nicotine dependence, cigarettes, uncomplicated: Secondary | ICD-10-CM | POA: Insufficient documentation

## 2017-11-08 DIAGNOSIS — M79662 Pain in left lower leg: Secondary | ICD-10-CM | POA: Insufficient documentation

## 2017-11-08 DIAGNOSIS — M79605 Pain in left leg: Secondary | ICD-10-CM

## 2017-11-08 DIAGNOSIS — Z79899 Other long term (current) drug therapy: Secondary | ICD-10-CM | POA: Insufficient documentation

## 2017-11-08 NOTE — ED Provider Notes (Signed)
[facility] Provider Note   CSN: [id number] Arrival date & time: 11/08/17  1030     History   Chief Complaint Chief Complaint  Patient presents with  . Leg Pain    HPI Sonya Garner is a 37 y.o. female who presents the emergency department today for concerns of worsening cellulitis.  Patient was seen at urgent care on 11/03/2017 after she had a lower leg laceration.  Have a 2 cm horizontal laceration that was still open.  The patient was started on antibiotics and discharged home.  She notes she has been taking antibiotics as prescribed (Keflex).  She notes that she has continued pain to the area so she is unsure if she has worsening infection.  She has been taking prescribed Celebrex for her symptoms with relief.  She denies any fever, chills, nausea/vomiting, surrounding erythema, swelling, drainage, numbness/weakness, new trauma or other concerning symptoms at this time.  HPI  Past Medical History:  Diagnosis Date  . Chronic kidney disease    kidney stone    Patient Active Problem List   Diagnosis Date Noted  . Mild concussion 12/05/2016  . TOBACCO ABUSE 02/20/2007  . 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 TUBAL LIGATION       OB History    Gravida  4   Para  4   Term  4   Preterm  0   AB  0   Living  4     SAB  0   TAB  0   Ectopic  0   Multiple  0   Live Births  4            Home Medications    Prior to Admission medications   Medication Sig Start Date End Date Taking? Authorizing Provider  celecoxib (CELEBREX) 100 MG capsule Take 1 capsule (100 mg total) by mouth 2 (two) times daily. 11/03/17  Yes Wallis BambergMani, Mario, PA-C  cephALEXin (KEFLEX) 500 MG capsule Take 1 capsule (500 mg total) by mouth 3 (three) times daily. 11/03/17  Yes Wallis BambergMani, Mario, PA-C  naproxen sodium (ALEVE) 220 MG tablet Take 220 mg by mouth as needed (pain).   Yes [provider]      Family History Family History  Problem Relation Age of Onset  . Cancer Maternal Aunt        breast  . Cancer Maternal Uncle   . Cancer Maternal Grandmother   . Hearing loss Neg Hx     Social History Social History   Tobacco Use  . Smoking status: Current Every Day Smoker    Packs/day: 0.25    Years: 10.00    Pack years: 2.50  . Smokeless tobacco: Never Used  Substance Use Topics  . Alcohol use: No  . Drug use: No     Allergies   Patient has no known allergies.   Review of Systems Review of Systems  All other systems reviewed and are negative.    Physical Exam Updated Vital Signs BP 118/85   Pulse 84   Temp 99.2 F (37.3 C) (Oral)   Resp 18   LMP 10/13/2017 (Exact Date)   SpO2 100%   Physical Exam  Constitutional: She appears well-developed and well-nourished.  HENT:  Head: Normocephalic and atraumatic.  Right Ear: External ear normal.  Left Ear: External ear normal.  Eyes: Conjunctivae are normal. Right eye exhibits no discharge. Left eye exhibits no discharge.  No scleral icterus.  Cardiovascular:  Pulses:      Dorsalis pedis pulses are 2+ on the left side.       Posterior tibial pulses are 2+ on the left side.  No lower extremity edema. Negative homans' sign. No calf ttp.   Pulmonary/Chest: Effort normal. No respiratory distress.  Musculoskeletal:       Left ankle: She exhibits normal range of motion, no swelling and no ecchymosis. No tenderness. Achilles tendon normal.       Left foot: Normal.  2 cm laceration over left posterior achilles. Appears to be well healing. No surronding erythema, heat, induration, draining or swelling. Achilles intact. Normal ROM of the ankle. Normal thompson test. Compartments soft. She is neurovascularly intact.   Neurological: She is alert. She has normal strength. No sensory deficit.  Skin: Skin is warm and dry. Capillary refill takes less than 2 seconds. No rash noted. No erythema. No pallor.  Psychiatric: She  has a normal mood and affect.  Nursing note and vitals reviewed.      ED Treatments / Results  Labs (all labs ordered are listed, but only abnormal results are displayed) Labs Reviewed - No data to display  EKG None  Radiology No results found.  Procedures Procedures (including critical care time)  Medications Ordered in ED Medications - No data to display   Initial Impression / Assessment and Plan / ED Course  I have reviewed the triage vital signs and the nursing notes.  Pertinent labs & imaging results that were available during my care of the patient were reviewed by me and considered in my medical decision making (see chart for details).     37 y.o. female who presents the emergency department today for concerns of worsening cellulitis.  Patient was seen at urgent care on 11/03/2017 after she had a lower leg laceration.  Have a 2 cm horizontal laceration that was still open.  The patient was started on antibiotics and discharged home.  She notes she has been taking antibiotics as prescribed (Keflex).  She notes that she has continued pain to the area so she is unsure if she has worsening infection.  She has been taking prescribed Celebrex for her symptoms with relief.  She denies any fever, chills, nausea/vomiting, surrounding erythema, swelling, drainage, numbness/weakness, new trauma or other concerning symptoms at this time.  Patient vital signs are reassuring today.  As above there is no signs of current or worsening infection.  She is neurovascular intact.  Compartments are soft.  Achilles intact.  Patient without evidence of septic joint.  Do not feel she needs any change in her therapy at this current time. I advised the patient to follow-up with PCP this week. Specific return precautions discussed. Time was given for all questions to be answered. The patient verbalized understanding and agreement with plan. The patient appears safe for discharge home.  Final Clinical  Impressions(s) / ED Diagnoses   Final diagnoses:  Pain of left lower extremity    ED Discharge Orders    None       Princella Pellegrini 11/08/17 1452    Vanetta Mulders, MD 11/10/17 2030

## 2017-11-08 NOTE — Discharge Instructions (Addendum)
Your exam was reassuring. Continue antibiotics and pain medication as previously prescribed. Please follow up with PCP this week for re-check.  Return for any fever, joint swelling, inability to move the joint or any new numbness.
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
## 2018-11-03 ENCOUNTER — Ambulatory Visit: Payer: Self-pay | Admitting: *Deleted

## 2018-11-03 ENCOUNTER — Encounter: Payer: Self-pay | Admitting: Physician Assistant

## 2018-11-03 ENCOUNTER — Telehealth: Payer: Self-pay | Admitting: Physician Assistant

## 2018-11-03 DIAGNOSIS — R05 Cough: Secondary | ICD-10-CM

## 2018-11-03 DIAGNOSIS — R059 Cough, unspecified: Secondary | ICD-10-CM

## 2018-11-03 DIAGNOSIS — H538 Other visual disturbances: Secondary | ICD-10-CM

## 2018-11-03 DIAGNOSIS — R0602 Shortness of breath: Secondary | ICD-10-CM

## 2018-11-03 MED ORDER — BENZONATATE 100 MG PO CAPS: 100.0000 mg | ORAL_CAPSULE | Freq: Two times a day (BID) | ORAL | 0 refills | Status: DC | PRN

## 2018-11-03 MED ORDER — ALBUTEROL SULFATE HFA 108 (90 BASE) MCG/ACT IN AERS: 2.0000 | INHALATION_SPRAY | RESPIRATORY_TRACT | 0 refills | Status: DC | PRN

## 2018-11-03 NOTE — Progress Notes (Signed)
E-Visit for Corona Virus Screening  Based on your current symptoms, you may very well have the virus, however your symptoms are mild. Currently, not all patients are being tested. If the symptoms are mild and there is not a known exposure, performing the test is not indicated.  Coronavirus disease 2019 (COVID-19) is a respiratory illness that can spread from person to person. The virus that causes COVID-19 is a new virus that was first identified in the country of [place] but is now found in multiple other countries and has spread to the [place].  Symptoms associated with the virus are mild to severe fever, cough, and shortness of breath. There is currently no vaccine to protect against COVID-19, and there is no specific antiviral treatment for the virus.   To be considered HIGH RISK for Coronavirus (COVID-19), you have to meet the following criteria:  . Traveled to [place], [place], [place], [place] or [place]; or in the [place] to [place], [place], [place], or [place]; and have fever, cough, and shortness of breath within the last 2 weeks of travel OR  . Been in close contact with a person diagnosed with COVID-19 within the last 2 weeks and have fever, cough, and shortness of breath  . IF YOU DO NOT MEET THESE CRITERIA, YOU ARE CONSIDERED LOW RISK FOR COVID-19.   It is vitally important that if you feel that you have an infection such as this virus or any other virus that you stay home and away from places where you may spread it to others.  You should self-quarantine for 14 days if you have symptoms that could potentially be coronavirus and avoid contact with people age 67 and older.   You can use medication such as A prescription cough medication called Tessalon Perles 100 mg. You may take 1-2 capsules every 8 hours as needed for cough and A prescription inhaler called Albuterol MDI 90 mcg /actuation 2 puffs every 4 hours as needed for shortness of breath, wheezing, cough  You may  also take acetaminophen (Tylenol) as needed for fever.   Please be advised, that blurred vision is not a common symptom of Covid, although the symptoms associated with this virus are not all yet known. Therefore, if the blurred vision is new, please go to an urgent care or the ER  In have also provided a work note   Reduce your risk of any infection by using the same precautions used for avoiding the common cold or flu:  Marland Kitchen Wash your hands often with soap and warm water for at least 20 seconds.  If soap and water are not readily available, use an alcohol-based hand sanitizer with at least 60% alcohol.  . If coughing or sneezing, cover your mouth and nose by coughing or sneezing into the elbow areas of your shirt or coat, into a tissue or into your sleeve (not your hands). . Avoid shaking hands with others and consider head nods or verbal greetings only. . Avoid touching your eyes, nose, or mouth with unwashed hands.  . Avoid close contact with people who are sick. . Avoid places or events with large numbers of people in one location, like concerts or sporting events. . Carefully consider travel plans you have or are making. . If you are planning any travel outside or inside the [place], visit the CDC's Travelers' Health webpage for the latest health notices. . If you have some symptoms but not all symptoms, continue to monitor at home  and seek medical attention if your symptoms worsen. . If you are having a medical emergency, call 911.  HOME CARE . Only take medications as instructed by your medical team. . Drink plenty of fluids and get plenty of rest. . A steam or ultrasonic humidifier can help if you have congestion.   GET HELP RIGHT AWAY IF: . You develop worsening fever. . You become short of breath . You cough up blood. . Your symptoms become more severe MAKE SURE YOU   Understand these instructions.  Will watch your condition.  Will get help right away if you are not doing well  or get worse.  Your e-visit answers were reviewed by a board certified advanced clinical practitioner to complete your personal care plan.  Depending on the condition, your plan could have included both over the counter or prescription medications.  If there is a problem please reply once you have received a response from your provider. Your safety is important to us.  If you have drug allergies check your prescription carefully.    You can use MyChart to ask questions about today's visit, request a non-urgent call back, or ask for a work or school excuse for 24 hours related to this e-Visit. If it has been greater than 24 hours you will need to follow up with your provider, or enter a new e-Visit to address those concerns. You will get an e-mail in the next two days asking about your experience.  I hope that your e-visit has been valuable and will speed your recovery. Thank you for using e-visits.   I have spent 7 min in completion and review of this note- Illa LevelSahar  [facility]

## 2018-11-03 NOTE — Telephone Encounter (Signed)
  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 She is now having cough, headache, chills, some shortness of breath and chest heaviness.  Can not check temp, does not have a fever. She normally is seen at the health department for her medical needs. Advised her to do an e visit to speak with a provider regarding her symptoms and that she should be quarantine at home. She stated she has to go to work, works in [employer]. Site given regarding an e visit. Pt voiced understanding.  Reason for Disposition . [1] Mild body aches, chills, diarrhea, headache, runny nose, or sore throat AND [2] within 14 days of COVID-Exposure  Answer Assessment - Initial Assessment Questions 1. CLOSE CONTACT: "Who is the person with the confirmed or suspected COVID-19 infection that you were exposed to?"     A co worker, arms length 2. PLACE of CONTACT: "Where were you when you were exposed to COVID-19?" (e.g., home, school, medical waiting room; which city?)     At work 3. TYPE of CONTACT: "How much contact was there?" (e.g., sitting next to, live in same house, work in same office, same building)     Same work area (grocery0 4. DURATION of CONTACT: "How long were you in contact with the COVID-19 patient?" (e.g., a few seconds, passed by person, a few minutes, live with the patient)     A few mins with the person 5. DATE of CONTACT: "When did you have contact with a COVID-19 patient?" (e.g., how many days ago)     On April 11 th 6. TRAVEL: "Have you traveled out of the country recently?" If so, "When and where?"     * Also ask about out-of-state travel, since the CDC has identified some high risk cities for community spread in the [place].     * Note: Travel becomes less relevant if there is widespread community transmission where the patient lives.     no 7. COMMUNITY SPREAD: "Are there lots of cases or COVID-19 (community spread) where you live?" (See  public health department website, if unsure)   * MAJOR community spread: high number of cases; numbers of cases are increasing; many people hospitalized.   * MINOR community spread: low number of cases; not increasing; few or no people hospitalized     In the community 8. SYMPTOMS: "Do you have any symptoms?" (e.g., fever, cough, breathing difficulty)     Cough, headache, having chills, some shortness of breath, chest tightness (heaviness) 9. PREGNANCY OR POSTPARTUM: "Is there any chance you are pregnant?" "When was your last menstrual period?" "Did you deliver in the last 2 weeks?"     No LMP started on March 25 th 10. HIGH RISK: "Do you have any heart or lung problems? Do you have a weak immune system?" (e.g., CHF, COPD, asthma, HIV positive, chemotherapy, renal failure, diabetes mellitus, sickle cell anemia)       no  Protocols used: CORONAVIRUS (COVID-19) EXPOSURE-A-AH

## 2018-12-12 ENCOUNTER — Other Ambulatory Visit: Payer: Self-pay

## 2018-12-12 ENCOUNTER — Encounter (HOSPITAL_COMMUNITY): Payer: Self-pay

## 2018-12-12 ENCOUNTER — Ambulatory Visit (HOSPITAL_COMMUNITY)
Admission: EM | Admit: 2018-12-12 | Discharge: 2018-12-12 | Disposition: A | Discharge status: Home / Self Care | Payer: Self-pay | Attending: Family Medicine | Admitting: Family Medicine

## 2018-12-12 DIAGNOSIS — S0501XA Injury of conjunctiva and corneal abrasion without foreign body, right eye, initial encounter: Secondary | ICD-10-CM

## 2018-12-12 MED ORDER — TETRACAINE HCL 0.5 % OP SOLN
OPHTHALMIC | Status: AC
  Filled 2018-12-12: qty 4

## 2018-12-12 MED ORDER — NEOMYCIN-POLYMYXIN-DEXAMETH 3.5-10000-0.1 OP SUSP: 1.0000 [drp] | OPHTHALMIC | 0 refills | Status: AC

## 2018-12-12 MED ORDER — FLUORESCEIN SODIUM 1 MG OP STRP
ORAL_STRIP | OPHTHALMIC | Status: AC
  Filled 2018-12-12: qty 1
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
## 2018-12-12 NOTE — ED Provider Notes (Signed)
[facility]    CSN: [id number] Arrival date & time: 12/12/18  1108     History   Chief Complaint Chief Complaint  Patient presents with  . Eye Problem  . Headache    HPI Sonya Garner is a 38 y.o. female.   HPI   Patient states that she was caring for a 2176-month-old child and that she leaned into pick up the child she accidentally got a scratch on her thigh.  It is painful and photophobic.  It is worse today than yesterday.  Vision is good.  Past Medical History:  Diagnosis Date  . Chronic kidney disease    kidney stone    Patient Active Problem List   Diagnosis Date Noted  . Mild concussion 12/05/2016  . TOBACCO ABUSE 02/20/2007  . 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 TUBAL LIGATION      OB History    Gravida  4   Para  4   Term  4   Preterm  0   AB  0   Living  4     SAB  0   TAB  0   Ectopic  0   Multiple  0   Live Births  4            Home Medications    Prior to Admission medications   Medication Sig Start Date End Date Taking? Authorizing Provider  albuterol (VENTOLIN HFA) 108 (90 Base) MCG/ACT inhaler Inhale 2 puffs into the lungs every 4 (four) hours as needed for wheezing or shortness of breath. 11/03/18   Demetrio Lappingsman, Sahar M, PA-C  celecoxib (CELEBREX) 100 MG capsule Take 1 capsule (100 mg total) by mouth 2 (two) times daily. 11/03/17   Wallis BambergMani, Mario, PA-C  naproxen sodium (ALEVE) 220 MG tablet Take 220 mg by mouth as needed (pain).    [provider]  neomycin-polymyxin b-dexamethasone (MAXITROL) 3.5-10000-0.1 SUSP Place 1 drop into the right eye every 4 (four) hours for 5 days. 12/12/18 12/17/18  Eustace MooreNelson, Eliani Leclere Sue, MD    Family History Family History  Problem Relation Age of Onset  . Cancer Maternal Aunt        breast  . Cancer Maternal Uncle   . Cancer Maternal Grandmother   . Hearing loss Neg Hx     Social History Social History   Tobacco Use  .  Smoking status: Current Every Day Smoker    Packs/day: 0.25    Years: 10.00    Pack years: 2.50  . Smokeless tobacco: Never Used  Substance Use Topics  . Alcohol use: No  . Drug use: No     Allergies   Patient has no known allergies.   Review of Systems Review of Systems  Constitutional: Negative for chills and fever.  HENT: Negative for ear pain and sore throat.   Eyes: Positive for photophobia, pain, redness and visual disturbance.  Respiratory: Negative for cough and shortness of breath.   Cardiovascular: Negative for chest pain and palpitations.  Gastrointestinal: Negative for abdominal pain and vomiting.  Genitourinary: Negative for dysuria and hematuria.  Musculoskeletal: Negative for arthralgias and back pain.  Skin: Negative for color change and rash.  Neurological: Negative for seizures and syncope.  All other systems reviewed and are negative.    Physical Exam Triage Vital Signs ED Triage Vitals  Enc Vitals Group     BP 12/12/18 1146 110/78     Pulse Rate  12/12/18 1146 66     Resp 12/12/18 1146 18     Temp 12/12/18 1146 97.9 F (36.6 C)     Temp Source 12/12/18 1146 Oral     SpO2 12/12/18 1146 100 %     Weight --      Height --      Head Circumference --      Peak Flow --      Pain Score 12/12/18 1147 9     Pain Loc --      Pain Edu? --      Excl. in GC? --    No data found.  Updated Vital Signs BP 110/78 (BP Location: Left Arm)   Pulse 66   Temp 97.9 F (36.6 C) (Oral)   Resp 18   LMP 12/03/2018   SpO2 100%     Physical Exam Constitutional:      General: She is not in acute distress.    Appearance: She is well-developed.  HENT:     Head: Normocephalic and atraumatic.     Mouth/Throat:     Mouth: Mucous membranes are moist.  Eyes:     General: Lids are normal. Lids are everted, no foreign bodies appreciated. No visual field deficit.       Right eye: No foreign body or discharge.        Left eye: No foreign body or discharge.      Conjunctiva/sclera:     Right eye: Right conjunctiva is injected.     Pupils: Pupils are equal, round, and reactive to light.   Neck:     Musculoskeletal: Normal range of motion.  Cardiovascular:     Rate and Rhythm: Normal rate.  Pulmonary:     Effort: Pulmonary effort is normal. No respiratory distress.  Abdominal:     General: There is no distension.     Palpations: Abdomen is soft.  Musculoskeletal: Normal range of motion.  Skin:    General: Skin is warm and dry.  Neurological:     Mental Status: She is alert.      UC Treatments / Results  Labs (all labs ordered are listed, but only abnormal results are displayed) Labs Reviewed - No data to display  EKG None  Radiology No results found.  Procedures Procedures (including critical care time)  Medications Ordered in UC Medications - No data to display  Initial Impression / Assessment and Plan / UC Course  I have reviewed the triage vital signs and the nursing notes.  Pertinent labs & imaging results that were available during my care of the patient were reviewed by me and considered in my medical decision making (see chart for details).    Discussed that corneal abrasions usually heal well.  This is not in the center of her vision.  We will give her eyedrops, antibiotics with steroid because of the severe photophobia.  Follow-up with eye physician Final Clinical Impressions(s) / UC Diagnoses   Final diagnoses:  Abrasion of right cornea, initial encounter     Discharge Instructions     Use eye drops as directed Take tylenol or ibuprofen for pain Cool compresses to eye  Call [facility] if not improving in 1-2 days Go to ER if worse   ED Prescriptions    Medication Sig Dispense Auth. Provider   neomycin-polymyxin b-dexamethasone (MAXITROL) 3.5-10000-0.1 SUSP Place 1 drop into the right eye every 4 (four) hours for 5 days. 1.5 mL Eustace Moore, MD  Controlled Substance Prescriptions [place]  Controlled Substance Registry consulted? No   Eustace Moore, MD 12/12/18 2010

## 2018-12-12 NOTE — Discharge Instructions (Signed)
Use eye drops as directed Take tylenol or ibuprofen for pain Cool compresses to eye  Call [facility] if not improving in 1-2 days Go to ER if worse

## 2021-07-25 ENCOUNTER — Other Ambulatory Visit: Payer: Self-pay

## 2021-07-25 ENCOUNTER — Ambulatory Visit (HOSPITAL_COMMUNITY)
Admission: EM | Admit: 2021-07-25 | Discharge: 2021-07-25 | Disposition: A | Discharge status: Home / Self Care | Payer: Self-pay

## 2022-07-04 ENCOUNTER — Encounter: Payer: Self-pay | Admitting: Podiatry

## 2022-07-04 ENCOUNTER — Ambulatory Visit (INDEPENDENT_AMBULATORY_CARE_PROVIDER_SITE_OTHER): Payer: Managed Care, Other (non HMO) | Admitting: Podiatry

## 2022-07-04 DIAGNOSIS — G5762 Lesion of plantar nerve, left lower limb: Secondary | ICD-10-CM

## 2022-07-04 NOTE — Progress Notes (Signed)
  Subjective:  Patient ID: Sonya Garner, female    DOB: [birth year],  MRN: [id number]  Chief Complaint  Patient presents with   Foot Pain    Discomfort in left foot  Pt stated that last week it was warm to the touch     41 y.o. female presents with the above complaint.  Patient presents with new complaint of left second interspace pain.  Patient states is painful to touch is progressive gotten worse hurts with ambulation worse with pressure.  Given the amount of pain that she is experiencing especially going barefooted for long period.  Pain scale is 7 out of 10 dull achy in nature.  The discomfort is warm to touch.  Started coming on last week.   Review of Systems: Negative except as noted in the HPI. Denies N/V/F/Ch.  Past Medical History:  Diagnosis Date   Chronic kidney disease    kidney stone    Current Outpatient Medications:    albuterol (VENTOLIN HFA) 108 (90 Base) MCG/ACT inhaler, Inhale 2 puffs into the lungs every 4 (four) hours as needed for wheezing or shortness of breath., Disp: 1 Inhaler, Rfl: 0   celecoxib (CELEBREX) 100 MG capsule, Take 1 capsule (100 mg total) by mouth 2 (two) times daily., Disp: 30 capsule, Rfl: 0   naproxen sodium (ALEVE) 220 MG tablet, Take 220 mg by mouth as needed (pain)., Disp: , Rfl:   Social History   Tobacco Use  Smoking Status Every Day   Packs/day: 0.25   Years: 10.00   Total pack years: 2.50   Types: Cigarettes  Smokeless Tobacco Never    No Known Allergies Objective:  There were no vitals filed for this visit. There is no height or weight on file to calculate BMI. Constitutional Well developed. Well nourished.  Vascular Dorsalis pedis pulses palpable bilaterally. Posterior tibial pulses palpable bilaterally. Capillary refill normal to all digits.  No cyanosis or clubbing noted. Pedal hair growth normal.  Neurologic Normal speech. Oriented to person, place, and time. Epicritic sensation to light touch grossly present  bilaterally.  Dermatologic Nails well groomed and normal in appearance. No open wounds. No skin lesions.  Orthopedic: Pain on palpation to the left second interspace positive Mulder's click noted.  Negative pain with extensor or flexor tendon is negative pain with with range of motion/resistive range of motion.  No deep intra-articular first MPJ pain noted.   Radiographs: None Assessment:   1. 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  Left second interspace neuroma -All questions and concerns were discussed with the patient in extensive detail.  Given the amount of pain that she is having I believe patient would benefit from a steroid injection to help decrease the inflammatory component associate with pain.  Patient agrees willing to proceed with steroid injection. -A steroid injection was performed at left second interspace using 1% plain Lidocaine and 10 mg of Kenalog. This was well tolerated.   n.

## 2022-08-01 ENCOUNTER — Ambulatory Visit: Payer: Managed Care, Other (non HMO) | Admitting: Podiatry

## 2022-11-12 ENCOUNTER — Other Ambulatory Visit: Payer: Self-pay

## 2022-11-12 ENCOUNTER — Emergency Department (HOSPITAL_BASED_OUTPATIENT_CLINIC_OR_DEPARTMENT_OTHER)
Admission: EM | Admit: 2022-11-12 | Discharge: 2022-11-12 | Disposition: A | Discharge status: Home / Self Care | Payer: Self-pay | Attending: Emergency Medicine | Admitting: Emergency Medicine

## 2022-11-12 ENCOUNTER — Encounter (HOSPITAL_BASED_OUTPATIENT_CLINIC_OR_DEPARTMENT_OTHER): Payer: Self-pay

## 2022-11-12 DIAGNOSIS — M545 Low back pain, unspecified: Secondary | ICD-10-CM | POA: Insufficient documentation

## 2022-11-12 DIAGNOSIS — M5432 Sciatica, left side: Secondary | ICD-10-CM | POA: Insufficient documentation

## 2022-11-12 MED ORDER — OXYCODONE HCL 5 MG PO TABS
5.0000 mg | ORAL_TABLET | Freq: Once | ORAL | Status: AC
  Administered 2022-11-12: 5 mg via ORAL
  Filled 2022-11-12: qty 1

## 2022-11-12 MED ORDER — ACETAMINOPHEN 500 MG PO TABS
1000.0000 mg | ORAL_TABLET | Freq: Once | ORAL | Status: AC
Start: 2022-11-12 — End: 2022-11-12
  Administered 2022-11-12: 1000 mg via ORAL
  Filled 2022-11-12: qty 2

## 2022-11-12 MED ORDER — DICLOFENAC SODIUM 1 % EX GEL: 4.0000 g | Freq: Four times a day (QID) | CUTANEOUS | 0 refills | Status: DC

## 2022-11-12 MED ORDER — METHYLPREDNISOLONE 4 MG PO TBPK: ORAL_TABLET | ORAL | 0 refills | Status: DC

## 2022-11-12 MED ORDER — DIAZEPAM 5 MG PO TABS
5.0000 mg | ORAL_TABLET | Freq: Once | ORAL | Status: AC
  Administered 2022-11-12: 5 mg via ORAL
  Filled 2022-11-12: qty 1

## 2022-11-12 NOTE — ED Provider Notes (Signed)
Sonya Garner Provider Note   CSN: [id number] Arrival date & time: 11/12/22  1715     History  Chief Complaint  Patient presents with   Leg Pain    Sonya Garner is a 42 y.o. female.  42 yo F with a chief complaint of left-sided low back pain that radiates down the leg.  She tells me that this started with some left heel pain she had seen a podiatrist and had a cortisone injection done locally.  She had some modest improvement but continued pain to the left heel and when she changed her gait.  3 days ago she developed a sensation of lightheadedness while at work and felt like she might pass out.  Lasted for short period of time and then resolved.  She went to urgent care today and they sent her here for evaluation.   Leg Pain      Home Medications Prior to Admission medications   Medication Sig Start Date End Date Taking? Authorizing Provider  diclofenac Sodium (VOLTAREN) 1 % GEL Apply 4 g topically 4 (four) times daily. 11/12/22  Yes Melene Plan, DO  methylPREDNISolone (MEDROL DOSEPAK) 4 MG TBPK tablet Day 1: 8mg  before breakfast, 4 mg after lunch, 4 mg after supper, and 8 mg at bedtime Day 2: 4 mg before breakfast, 4 mg after lunch, 4 mg  after supper, and 8 mg  at bedtime Day 3:  4 mg  before breakfast, 4 mg  after lunch, 4 mg after supper, and 4 mg  at bedtime Day 4: 4 mg  before breakfast, 4 mg  after lunch, and 4 mg at bedtime Day 5: 4 mg  before breakfast and 4 mg at bedtime Day 6: 4 mg  before breakfast 11/12/22  Yes Melene Plan, DO  albuterol (VENTOLIN HFA) 108 (90 Base) MCG/ACT inhaler Inhale 2 puffs into the lungs every 4 (four) hours as needed for wheezing or shortness of breath. 11/03/18   Demetrio Lapping, PA-C  celecoxib (CELEBREX) 100 MG capsule Take 1 capsule (100 mg total) by mouth 2 (two) times daily. 11/03/17   Wallis Bamberg, PA-C  naproxen sodium (ALEVE) 220 MG tablet Take 220 mg by mouth as needed (pain).    [provider]      Allergies    Patient has no known allergies.    Review of Systems   Review of Systems  Physical Exam Updated Vital Signs BP 130/75 (BP Location: Right Arm)   Pulse (!) 111   Temp 98.8 F (37.1 C) (Oral)   Resp 18   Ht 5\' 4"  (1.626 m)   SpO2 100%   BMI 20.94 kg/m  Physical Exam Vitals and nursing note reviewed.  Constitutional:      General: She is not in acute distress.    Appearance: She is well-developed. She is not diaphoretic.  HENT:     Head: Normocephalic and atraumatic.  Eyes:     Pupils: Pupils are equal, round, and reactive to light.  Cardiovascular:     Rate and Rhythm: Normal rate and regular rhythm.     Heart sounds: No murmur heard.    No friction rub. No gallop.  Pulmonary:     Effort: Pulmonary effort is normal.     Breath sounds: No wheezing or rales.  Abdominal:     General: There is no distension.     Palpations: Abdomen is soft.     Tenderness: There is no abdominal tenderness.  Musculoskeletal:        General: Tenderness present.     Cervical back: Normal range of motion and neck supple.     Comments: Pms intact to the LLE.  Negative straight leg raise test.  No midline spinal tenderness step-offs or deformities.  Skin:    General: Skin is warm and dry.  Neurological:     Mental Status: She is alert and oriented to person, place, and time.  Psychiatric:        Behavior: Behavior normal.     ED Results / Procedures / Treatments   Labs (all labs ordered are listed, but only abnormal results are displayed) Labs Reviewed - No data to display  EKG None  Radiology No results found.  Procedures Procedures    Medications Ordered in ED Medications  acetaminophen (TYLENOL) tablet 1,000 mg (has no administration in time range)  oxyCODONE (Oxy IR/ROXICODONE) immediate release tablet 5 mg (has no administration in time range)  diazepam (VALIUM) tablet 5 mg (has no administration in time range)    ED Course/ Medical  Decision Making/ A&P                             Medical Decision Making Risk OTC drugs. Prescription drug management.   42 yo F with a cc of leftsided low back pain.  Going on for a couple days. Clinically the patient has sciatica.  No red flags.  IM toradol done at urgent care and was sent here to rule out a PE.  I am not really sure how the 2 are related.  I do not feel that the patient needs further workup for pulmonary embolism.  She has what sounds like a vasovagal events on Friday that has resolved.  She is having no chest pain or difficulty breathing.  I am going to treat her symptomatically for sciatica.  Have her follow-up with her family doctor in the office.  6:25 PM:  I have discussed the diagnosis/risks/treatment options with the patient and family.  Evaluation and diagnostic testing in the emergency department does not suggest an emergent condition requiring admission or immediate intervention beyond what has been performed at this time.  They will follow up with PCP. We also discussed returning to the ED immediately if new or worsening sx occur. We discussed the sx which are most concerning (e.g., sudden worsening pain, fever, inability to tolerate by mouth, cauda equina s/sx) that necessitate immediate return. Medications administered to the patient during their visit and any new prescriptions provided to the patient are listed below.  Medications given during this visit Medications  acetaminophen (TYLENOL) tablet 1,000 mg (has no administration in time range)  oxyCODONE (Oxy IR/ROXICODONE) immediate release tablet 5 mg (has no administration in time range)  diazepam (VALIUM) tablet 5 mg (has no administration in time range)     The patient appears reasonably screen and/or stabilized for discharge and I doubt any other medical condition or other [facility] requiring further screening, evaluation, or treatment in the ED at this time prior to discharge.          Final Clinical  Impression(s) / ED Diagnoses Final diagnoses:  Sciatica of left side    Rx / DC Orders ED Discharge Orders          Ordered    methylPREDNISolone (MEDROL DOSEPAK) 4 MG TBPK tablet        11/12/22 1820    diclofenac Sodium (VOLTAREN)  1 % GEL  4 times daily        11/12/22 1820              Melene Plan, DO 11/12/22 1825

## 2022-11-12 NOTE — ED Triage Notes (Signed)
Patient here POV from Home.  Endorses Onset of Dizziness that occurred Friday while at work. Subsided after 20 minutes but was associated with Left Lower Back Pain that radiates down Left leg.  Seen at PCP and was given IM Injection of Ketoralac which has been helpful.  NAD Noted during Triage. A&Ox4. GCS 15. BIB Wheelchair.

## 2022-11-12 NOTE — Discharge Instructions (Signed)

## 2022-11-14 DIAGNOSIS — I2699 Other pulmonary embolism without acute cor pulmonale: Secondary | ICD-10-CM

## 2022-11-14 HISTORY — DX: Other pulmonary embolism without acute cor pulmonale: I26.99

## 2022-11-20 ENCOUNTER — Other Ambulatory Visit: Payer: Self-pay

## 2022-11-20 ENCOUNTER — Observation Stay (HOSPITAL_COMMUNITY)
Admission: EM | Admit: 2022-11-20 | Discharge: 2022-11-21 | Disposition: A | Discharge status: Home / Self Care | Payer: Managed Care, Other (non HMO) | Attending: Emergency Medicine | Admitting: Emergency Medicine

## 2022-11-20 ENCOUNTER — Emergency Department (HOSPITAL_COMMUNITY): Payer: Managed Care, Other (non HMO)

## 2022-11-20 ENCOUNTER — Encounter (HOSPITAL_COMMUNITY): Payer: Self-pay | Admitting: Emergency Medicine

## 2022-11-20 ENCOUNTER — Inpatient Hospital Stay (HOSPITAL_COMMUNITY): Payer: Managed Care, Other (non HMO)

## 2022-11-20 DIAGNOSIS — F17200 Nicotine dependence, unspecified, uncomplicated: Secondary | ICD-10-CM | POA: Diagnosis present

## 2022-11-20 DIAGNOSIS — I824Y2 Acute embolism and thrombosis of unspecified deep veins of left proximal lower extremity: Secondary | ICD-10-CM | POA: Diagnosis not present

## 2022-11-20 DIAGNOSIS — D259 Leiomyoma of uterus, unspecified: Secondary | ICD-10-CM | POA: Insufficient documentation

## 2022-11-20 DIAGNOSIS — I2699 Other pulmonary embolism without acute cor pulmonale: Secondary | ICD-10-CM | POA: Insufficient documentation

## 2022-11-20 DIAGNOSIS — R609 Edema, unspecified: Secondary | ICD-10-CM | POA: Diagnosis not present

## 2022-11-20 DIAGNOSIS — R531 Weakness: Secondary | ICD-10-CM | POA: Diagnosis present

## 2022-11-20 DIAGNOSIS — D649 Anemia, unspecified: Principal | ICD-10-CM | POA: Diagnosis present

## 2022-11-20 DIAGNOSIS — I824Z2 Acute embolism and thrombosis of unspecified deep veins of left distal lower extremity: Secondary | ICD-10-CM | POA: Insufficient documentation

## 2022-11-20 LAB — PREPARE RBC (CROSSMATCH)

## 2022-11-20 LAB — I-STAT BETA HCG BLOOD, ED (MC, WL, AP ONLY): I-stat hCG, quantitative: <5 m[IU]/mL (ref <5)

## 2022-11-20 LAB — HEPATIC FUNCTION PANEL
ALT: 25 U/L (ref 0–44)
AST: 20 U/L (ref 15–41)
Albumin: 3.3 g/dL — ABNORMAL LOW (ref 3.5–5.0)
Alkaline Phosphatase: 41 U/L (ref 38–126)
Bilirubin, Direct: <0.1 mg/dL (ref 0.0–0.2)
Total Bilirubin: 0.4 mg/dL (ref 0.3–1.2)
Total Protein: 6.5 g/dL (ref 6.5–8.1)

## 2022-11-20 LAB — CBC WITH DIFFERENTIAL/PLATELET
Abs Immature Granulocytes: 0.03 10*3/uL (ref 0.00–0.07)
Basophils Absolute: 0.1 10*3/uL (ref 0.0–0.1)
Basophils Relative: 1 %
Eosinophils Absolute: 0.2 10*3/uL (ref 0.0–0.5)
Eosinophils Relative: 2 %
HCT: 18.7 % — ABNORMAL LOW (ref 36.0–46.0)
Hemoglobin: 5.2 g/dL — CL (ref 12.0–15.0)
Immature Granulocytes: 0 %
Lymphocytes Relative: 25 %
Lymphs Abs: 2 10*3/uL (ref 0.7–4.0)
MCH: 16.1 pg — ABNORMAL LOW (ref 26.0–34.0)
MCHC: 27.8 g/dL — ABNORMAL LOW (ref 30.0–36.0)
MCV: 57.9 fL — ABNORMAL LOW (ref 80.0–100.0)
Monocytes Absolute: 1.4 10*3/uL — ABNORMAL HIGH (ref 0.1–1.0)
Monocytes Relative: 18 %
Neutro Abs: 4.2 10*3/uL (ref 1.7–7.7)
Neutrophils Relative %: 54 %
Platelets: 191 10*3/uL (ref 150–400)
RBC: 3.23 MIL/uL — ABNORMAL LOW (ref 3.87–5.11)
RDW: 34.5 % — ABNORMAL HIGH (ref 11.5–15.5)
WBC: 7.8 10*3/uL (ref 4.0–10.5)
nRBC: 0.5 % — ABNORMAL HIGH (ref 0.0–0.2)

## 2022-11-20 LAB — LACTATE DEHYDROGENASE: LDH: 148 U/L (ref 98–192)

## 2022-11-20 LAB — TYPE AND SCREEN: ABO/RH(D): O POS

## 2022-11-20 LAB — BASIC METABOLIC PANEL
Anion gap: 10 (ref 5–15)
BUN: 9 mg/dL (ref 6–20)
CO2: 22 mmol/L (ref 22–32)
Calcium: 8.9 mg/dL (ref 8.9–10.3)
Chloride: 101 mmol/L (ref 98–111)
Creatinine, Ser: 0.64 mg/dL (ref 0.44–1.00)
GFR, Estimated: >60 mL/min (ref >60)
Glucose, Bld: 101 mg/dL — ABNORMAL HIGH (ref 70–99)
Potassium: 3.8 mmol/L (ref 3.5–5.1)
Sodium: 133 mmol/L — ABNORMAL LOW (ref 135–145)

## 2022-11-20 LAB — FOLATE: Folate: 6.4 ng/mL (ref >5.9)

## 2022-11-20 LAB — IRON AND TIBC
Iron: 11 ug/dL — ABNORMAL LOW (ref 28–170)
Saturation Ratios: 2 % — ABNORMAL LOW (ref 10.4–31.8)
TIBC: 514 ug/dL — ABNORMAL HIGH (ref 250–450)
UIBC: 503 ug/dL

## 2022-11-20 LAB — RETICULOCYTES
Immature Retic Fract: 38 % — ABNORMAL HIGH (ref 2.3–15.9)
RBC.: 3.17 MIL/uL — ABNORMAL LOW (ref 3.87–5.11)
Retic Count, Absolute: 61.5 10*3/uL (ref 19.0–186.0)
Retic Ct Pct: 1.9 % (ref 0.4–3.1)

## 2022-11-20 LAB — TROPONIN I (HIGH SENSITIVITY)
Troponin I (High Sensitivity): 4 ng/L (ref <18)
Troponin I (High Sensitivity): 3 ng/L (ref <18)

## 2022-11-20 LAB — TECHNOLOGIST SMEAR REVIEW

## 2022-11-20 LAB — BPAM RBC
ISSUE DATE / TIME: 202405071230
Unit Type and Rh: 5100
Unit Type and Rh: 5100

## 2022-11-20 LAB — POC OCCULT BLOOD, ED: Fecal Occult Bld: NEGATIVE

## 2022-11-20 LAB — FERRITIN: Ferritin: 4 ng/mL — ABNORMAL LOW (ref 11–307)

## 2022-11-20 LAB — VITAMIN B12: Vitamin B-12: 323 pg/mL (ref 180–914)

## 2022-11-20 LAB — ABO/RH: ABO/RH(D): O POS

## 2022-11-20 MED ORDER — MORPHINE SULFATE (PF) 4 MG/ML IV SOLN
4.0000 mg | Freq: Once | INTRAVENOUS | Status: AC
  Administered 2022-11-20: 4 mg via INTRAVENOUS
  Filled 2022-11-20: qty 1

## 2022-11-20 MED ORDER — IOHEXOL 350 MG/ML SOLN
75.0000 mL | Freq: Once | INTRAVENOUS | Status: AC | PRN
  Administered 2022-11-20: 75 mL via INTRAVENOUS

## 2022-11-20 MED ORDER — LIDOCAINE 5 % EX PTCH
1.0000 | MEDICATED_PATCH | CUTANEOUS | Status: DC
  Administered 2022-11-20 – 2022-11-21 (×2): 1 via TRANSDERMAL
  Filled 2022-11-20 (×2): qty 1

## 2022-11-20 MED ORDER — HEPARIN (PORCINE) 25000 UT/250ML-% IV SOLN
1150.0000 [IU]/h | INTRAVENOUS | Status: DC
  Administered 2022-11-20: 1000 [IU]/h via INTRAVENOUS
  Filled 2022-11-20: qty 250

## 2022-11-20 MED ORDER — SODIUM CHLORIDE 0.9% IV SOLUTION: Freq: Once | INTRAVENOUS | Status: AC

## 2022-11-20 MED ORDER — OXYCODONE HCL 5 MG PO TABS: 5.0000 mg | ORAL_TABLET | ORAL | Status: DC | PRN

## 2022-11-20 MED ORDER — ACETAMINOPHEN 500 MG PO TABS
1000.0000 mg | ORAL_TABLET | Freq: Four times a day (QID) | ORAL | Status: DC
  Administered 2022-11-20 – 2022-11-21 (×5): 1000 mg via ORAL
  Filled 2022-11-20 (×5): qty 2

## 2022-11-20 MED ORDER — GADOBUTROL 1 MMOL/ML IV SOLN
6.0000 mL | Freq: Once | INTRAVENOUS | Status: AC | PRN
  Administered 2022-11-20: 6 mL via INTRAVENOUS

## 2022-11-20 MED ORDER — SENNOSIDES-DOCUSATE SODIUM 8.6-50 MG PO TABS: 1.0000 | ORAL_TABLET | Freq: Every evening | ORAL | Status: DC | PRN

## 2022-11-20 MED ORDER — SODIUM CHLORIDE 0.9% IV SOLUTION: Freq: Once | INTRAVENOUS | Status: DC

## 2022-11-20 MED ORDER — HEPARIN BOLUS VIA INFUSION
2500.0000 [IU] | Freq: Once | INTRAVENOUS | Status: AC
  Administered 2022-11-20: 2500 [IU] via INTRAVENOUS
  Filled 2022-11-20: qty 2500

## 2022-11-20 NOTE — Progress Notes (Signed)
Lower extremity venous left study completed.  Preliminary results relayed to Aberman, PA.  See CV Proc for preliminary results report.   Jersi Mcmaster, RDMS, RVT  

## 2022-11-20 NOTE — ED Provider Notes (Signed)
Sonya Garner EMERGENCY DEPARTMENT AT [facility] Provider Note   CSN: [id number] Arrival date & time: 11/20/22  0715     History  Chief Complaint  Patient presents with   Leg Pain    Sonya Garner is a 42 y.o. female with a history of CKD who presents to the ED due to persistent left lower extremity pain, numbness/tingling, and weakness x 2 weeks.  Patient states symptoms started 2 weeks ago with dizziness which she describes as room spinning sensation.  Patient states she was at work and felt like she was going to pass out.  She notes she then developed numbness/tingling to her left arm and left lower extremity.  Left arm numbness/tingling resolved after a few days however, left lower extremity persisted.  Patient notes she woke up this morning in her urine and had a bowel movement without knowledge of it while on the toilet. No low back injury.  Denies fever and chills.  No history of IV drug use.  Patient also admits to left lower extremity edema.  Admits to some shortness of breath only with exertion.  No history of blood clots, recent surgeries or recent long immobilizations, or hormonal treatments.  Denies history of chronic low back pain.  Patient was seen in the ED on 4/29 and diagnosed with sciatica.  Denies visual and speech changes.  No previous CVA.  History obtained from patient and past medical records. No interpreter used during encounter.        Home Medications Prior to Admission medications   Medication Sig Start Date End Date Taking? Authorizing Provider  diclofenac Sodium (VOLTAREN) 1 % GEL Apply 4 g topically 4 (four) times daily. 11/12/22  Yes Melene Plan, DO  ibuprofen (ADVIL) 200 MG tablet Take 200-800 mg by mouth every 6 (six) hours as needed for moderate pain.   Yes [provider]  methylPREDNISolone (MEDROL DOSEPAK) 4 MG TBPK tablet Day 1: 8mg  before breakfast, 4 mg after lunch, 4 mg after supper, and 8 mg at bedtime Day 2: 4 mg before  breakfast, 4 mg after lunch, 4 mg  after supper, and 8 mg  at bedtime Day 3:  4 mg  before breakfast, 4 mg  after lunch, 4 mg after supper, and 4 mg  at bedtime Day 4: 4 mg  before breakfast, 4 mg  after lunch, and 4 mg at bedtime Day 5: 4 mg  before breakfast and 4 mg at bedtime Day 6: 4 mg  before breakfast Patient not taking: Reported on 11/20/2022 11/12/22   Melene Plan, DO      Allergies    Patient has no known allergies.    Review of Systems   Review of Systems  Constitutional:  Negative for fever.  Respiratory:  Positive for shortness of breath.   Cardiovascular:  Positive for leg swelling. Negative for chest pain.  Musculoskeletal:  Positive for arthralgias, back pain and gait problem.  Neurological:  Positive for weakness and numbness.    Physical Exam Updated Vital Signs BP 104/89   Pulse 99   Temp 98.8 F (37.1 C) (Oral)   Resp 17   Ht 5\' 4"  (1.626 m)   Wt 61.7 kg   LMP 10/24/2022 (Approximate)   SpO2 100%   BMI 23.34 kg/m  Physical Exam Vitals and nursing note reviewed.  Constitutional:      General: She is not in acute distress.    Appearance: She is not ill-appearing.  HENT:  Head: Normocephalic.  Eyes:     Pupils: Pupils are equal, round, and reactive to light.  Cardiovascular:     Rate and Rhythm: Regular rhythm. Tachycardia present.     Pulses: Normal pulses.     Heart sounds: Normal heart sounds. No murmur heard.    No friction rub. No gallop.  Pulmonary:     Effort: Pulmonary effort is normal.     Breath sounds: Normal breath sounds.  Abdominal:     General: Abdomen is flat. There is no distension.     Palpations: Abdomen is soft.     Tenderness: There is no abdominal tenderness. There is no guarding or rebound.  Genitourinary:    Comments: Rectal exam performed with chaperone in room.  Slight decreased rectal tone. Musculoskeletal:        General: Normal range of motion.     Cervical back: Neck supple.     Comments: Subjective decreased  sensation to left lower extremity.  Patient able to slightly lift left lower extremity from bed. Decreased strength of LLE. No thoracic or lumbar midline tenderness.  Slight edema to left lower extremity.  Pedal pulses palpable.  Skin:    General: Skin is warm and dry.  Neurological:     General: No focal deficit present.     Mental Status: She is alert.  Psychiatric:        Mood and Affect: Mood normal.        Behavior: Behavior normal.     ED Results / Procedures / Treatments   Labs (all labs ordered are listed, but only abnormal results are displayed) Labs Reviewed  CBC WITH DIFFERENTIAL/PLATELET - Abnormal; Notable for the following components:      Result Value   RBC 3.23 (*)    Hemoglobin 5.2 (*)    HCT 18.7 (*)    MCV 57.9 (*)    MCH 16.1 (*)    MCHC 27.8 (*)    RDW 34.5 (*)    nRBC 0.5 (*)    Monocytes Absolute 1.4 (*)    All other components within normal limits  BASIC METABOLIC PANEL - Abnormal; Notable for the following components:   Sodium 133 (*)    Glucose, Bld 101 (*)    All other components within normal limits  RETICULOCYTES - Abnormal; Notable for the following components:   RBC. 3.17 (*)    Immature Retic Fract 38.0 (*)    All other components within normal limits  FOLATE  VITAMIN B12  IRON AND TIBC  FERRITIN  TECHNOLOGIST SMEAR REVIEW  LACTATE DEHYDROGENASE  HAPTOGLOBIN  HEPATIC FUNCTION PANEL  I-STAT BETA HCG BLOOD, ED ([facility], [facility], [facility] ONLY)  POC OCCULT BLOOD, ED  TYPE AND SCREEN  ABO/RH  PREPARE RBC (CROSSMATCH)  TROPONIN I (HIGH SENSITIVITY)  TROPONIN I (HIGH SENSITIVITY)    EKG None  Radiology MR Lumbar Spine W Wo Contrast  Result Date: 11/20/2022 CLINICAL DATA:  Low back pain radiating to the left leg. EXAM: MRI LUMBAR SPINE WITHOUT AND WITH CONTRAST TECHNIQUE: Multiplanar and multiecho pulse sequences of the lumbar spine were obtained without and with intravenous contrast. CONTRAST:  6mL GADAVIST GADOBUTROL 1 MMOL/ML IV SOLN COMPARISON:   None Available. FINDINGS: Segmentation: Standard; the lowest formed disc space is designated L5-S1. Alignment:  Normal. Vertebrae: Vertebral body heights are preserved. Background marrow signal is normal. There is no suspicious marrow signal abnormality or marrow edema. There is no abnormal marrow enhancement. Conus medullaris and cauda equina: Conus extends to the  L1-L2 level. Conus and cauda equina appear normal. Paraspinal and other soft tissues: A left renal cyst is noted requiring no specific imaging follow-up. The uterus is enlarged with numerous fibroids. The paraspinal soft tissues are unremarkable. Disc levels: The disc heights are preserved. There is no significant disc herniation. There is minimal left facet arthropathy at L5-S1 with a small posteriorly projecting synovial cyst (5-13). The facet joints are otherwise normal. There is no significant spinal canal or neural foraminal stenosis. There is no evidence of nerve root impingement. IMPRESSION: 1. Essentially normal appearance of the lumbar spine with no acute finding or significant degenerative change to explain the patient's symptoms. 2. Enlarged fibroid uterus. Electronically Signed   By: Lesia Hausen M.D.   On: 11/20/2022 09:40   VAS [place] LOWER EXTREMITY VENOUS (DVT) (7a-7p)  Result Date: 11/20/2022  Lower Venous DVT Study Patient Name:  EVENIE BALTA  Date of Exam:   11/20/2022 Medical Rec #: [id number]         Accession #:    [id number] Date of Birth: [birth year]         Patient Gender: F Patient Age:   37 years Exam Location:  [facility] Procedure:      VAS [place] LOWER EXTREMITY VENOUS (DVT) Referring Phys: Claudette Stapler --------------------------------------------------------------------------------  Indications: Left lower extremity pain.  Limitations: Patient pain with compression. Comparison Study: No prior studies. Performing Technologist: Jean Rosenthal RDMS, RVT  Examination Guidelines: A complete evaluation includes B-mode  imaging, spectral Doppler, color Doppler, and power Doppler as needed of all accessible portions of each vessel. Bilateral testing is considered an integral part of a complete examination. Limited examinations for reoccurring indications may be performed as noted. The reflux portion of the exam is performed with the patient in reverse Trendelenburg.  +-----+---------------+---------+-----------+----------+--------------+ RIGHTCompressibilityPhasicitySpontaneityPropertiesThrombus Aging +-----+---------------+---------+-----------+----------+--------------+ CFV  Full           Yes      Yes                                 +-----+---------------+---------+-----------+----------+--------------+   +---------+---------------+---------+-----------+----------+--------------+ LEFT     CompressibilityPhasicitySpontaneityPropertiesThrombus Aging +---------+---------------+---------+-----------+----------+--------------+ CFV      Full           Yes      Yes                                 +---------+---------------+---------+-----------+----------+--------------+ SFJ      Full                                                        +---------+---------------+---------+-----------+----------+--------------+ FV Prox  Full                                                        +---------+---------------+---------+-----------+----------+--------------+ FV Mid   Full                                                        +---------+---------------+---------+-----------+----------+--------------+  FV DistalFull                                                        +---------+---------------+---------+-----------+----------+--------------+ PFV      Full                                                        +---------+---------------+---------+-----------+----------+--------------+ POP      None           No       No                   Acute           +---------+---------------+---------+-----------+----------+--------------+ PTV      Partial        Yes      Yes                  Acute          +---------+---------------+---------+-----------+----------+--------------+ PERO     Partial        Yes      Yes                  Acute          +---------+---------------+---------+-----------+----------+--------------+ Gastroc  None           No       No                   Acute          +---------+---------------+---------+-----------+----------+--------------+    Summary: RIGHT: - No evidence of common femoral vein obstruction.  LEFT: - Findings consistent with acute deep vein thrombosis involving the left popliteal vein, left gastrocnemius veins, left posterior tibial veins, and left peroneal veins. - No cystic structure found in the popliteal fossa.  *See table(s) above for measurements and observations.    Preliminary    DG Chest Port 1 View  Result Date: 11/20/2022 CLINICAL DATA:  Shortness of breath. EXAM: PORTABLE CHEST 1 VIEW COMPARISON:  None Available. FINDINGS: Clear lungs. Normal heart size and mediastinal contours. No pleural effusion or pneumothorax. Visualized bones and upper abdomen are unremarkable. IMPRESSION: No evidence of acute cardiopulmonary disease. Electronically Signed   By: Orvan Falconer M.D.   On: 11/20/2022 08:54    Procedures .Critical Care  Performed by: Mannie Stabile, PA-C Authorized by: Mannie Stabile, PA-C   Critical care provider statement:    Critical care time (minutes):  38   Critical care was necessary to treat or prevent imminent or life-threatening deterioration of the following conditions:  Endocrine crisis   Critical care was time spent personally by me on the following activities:  Development of treatment plan with patient or surrogate, discussions with consultants, evaluation of patient's response to treatment, examination of patient, ordering and review of laboratory studies,  ordering and review of radiographic studies, ordering and performing treatments and interventions, pulse oximetry, re-evaluation of patient's condition and review of old charts   I assumed direction of critical care for this patient from another provider in my specialty: no     Care discussed with: admitting provider  Medications Ordered in ED Medications  lidocaine (LIDODERM) 5 % 1 patch (1 patch Transdermal Patch Applied 11/20/22 0945)  0.9 %  sodium chloride infusion (Manually program via Guardrails IV Fluids) (has no administration in time range)  morphine (PF) 4 MG/ML injection 4 mg (4 mg Intravenous Given 11/20/22 0833)  gadobutrol (GADAVIST) 1 MMOL/ML injection 6 mL (6 mLs Intravenous Contrast Given 11/20/22 0932)    ED Course/ Medical Decision Making/ A&P Clinical Course as of 11/20/22 1202  Tue Nov 20, 2022  1053 Fecal Occult Blood, POC: NEGATIVE [CA]    Clinical Course User Index [CA] Mannie Stabile, PA-C                             Medical Decision Making Amount and/or Complexity of Data Reviewed Independent Historian: spouse External Data Reviewed: notes.    Details: UC note Labs: ordered. Decision-making details documented in ED Course. Radiology: ordered and independent interpretation performed. Decision-making details documented in ED Course. ECG/medicine tests: ordered and independent interpretation performed. Decision-making details documented in ED Course.  Risk Prescription drug management. Decision regarding hospitalization.   This patient presents to the ED for concern of LLE pain/weakness, this involves an extensive number of treatment options, and is a complaint that carries with it a high risk of complications and morbidity.  The differential diagnosis includes cauda equina, MSK etiology, DVT, etc  42 year old female presents to the ED due to persistent left lower extremity numbness/tingling, pain, and weakness x 2 weeks.  Symptoms started with  sudden onset of dizziness while at work 2 weeks ago.  Admits to some shortness of breath with exertion however, no chest pain.  Also admits to some left lower extremity edema.  No history of blood clots, recent surgeries or recent long immobilizations, or hormonal treatments.  Patient woke up this morning in her urine and had a bowel movement without knowledge while on the toilet.  Denies low back trauma.  No fever or IV drug use.  Denies history of cancer.  Upon arrival, patient afebrile, tachycardic at 110 with otherwise reassuring vitals.  Patient in no acute distress.  Rectal exam performed with chaperone in room with slight decreased rectal tone.  Decreased strength of left lower extremity and subjective decreased sensation.  Slight edema to left lower extremity.  Ultrasound rule out DVT.  Routine labs ordered.  MRI L-spine to rule out evidence of cauda equina or other etiologies of left lower extremity weakness and pain.  Equal grip strength.  No cervical midline tenderness. Discussed with Dr. Denton Lank who evaluated patient at bedside and agrees with assessment and plan. Low suspicion for CVA.  CBC significant for anemia with hemoglobin at 5.2.  Patient states she has a history of anemia and is suppose to take supplements which she has not been taking.  Last menses was roughly 1 month ago.  Denies hematemesis, melena, and hematochezia. No previous GI bleed. Notes her menses have been heavier over the past few months. Suspect patient's dizziness and SOB are related to symptomatic anemia.  Discussed risk and benefits of blood transfusion.  Patient agreeable for transfusion.  Pregnancy test negative. [place] reviewed and positive for DVT. Suspect patient's leg pain related to DVT. MRI negative. Possible weakness secondary to pain from DVT.  Chest x-ray personally reviewed and interpreted which is negative for signs of pneumonia, pneumothorax or widened mediastinum.   Upon reassessment, patient still mildly  tachycardic on cardiac monitor.  Patient  will require admission for symptomatic anemia and acute DVT.  Will hold off on CTA chest given she was given contrast for MRI.  Will defer CTA chest to hospitalist.  11:02 AM Discussed with Dr. Marijo Conception with internal medicine who agrees to admit patient. Will hold off on anticoagulant after discussion with Dr. Marijo Conception, suspect she will be started on one during her admission.  No PCP Hx anemia       Final Clinical Impression(s) / ED Diagnoses Final diagnoses:  Symptomatic anemia  Acute deep vein thrombosis (DVT) of proximal vein of left lower extremity [facility])    Rx / DC Orders ED Discharge Orders     None         Jesusita Oka 11/20/22 1202    Cathren Laine, MD 11/20/22 1254

## 2022-11-20 NOTE — Progress Notes (Signed)
CLINICAL UPDATE:  Received page from radiology that CTA chest showed bilateral pulmonary emboli. No evidence of heart strain. Went to bedside to discuss these results with Sonya Garner. On my exam, patient continues to appear comfortable. Blood pressure appears stable, HR 90's, not hypoxic. She is not in any respiratory distress.   PESI score 40, low risk. With this and no evidence of strain on CTA we will hold off echocardiogram. Will continue with heparin gtt, will defer further management of anticoagulation to team taking over tomorrow AM.   Evlyn Kanner, MD Internal Medicine PGY-3 Pager: [phone]

## 2022-11-20 NOTE — Plan of Care (Signed)
  Problem: Education: Goal: Knowledge of General Education information will improve Description Including pain rating scale, medication(s)/side effects and non-pharmacologic comfort measures Outcome: Progressing   

## 2022-11-20 NOTE — Progress Notes (Addendum)
ANTICOAGULATION CONSULT NOTE  Pharmacy Consult for heparin Indication:  VTE  Heparin Dosing Weight: 61.7 kg  Labs: Recent Labs    11/20/22 0820  HGB 5.2*  HCT 18.7*  PLT 191  CREATININE 0.64    Assessment: 42 yof presenting with leg pain, found to have acute LLE DVT. CTA pending. Patient is not on anticoagulation PTA. Noted Hg low at 5.2 - PRBC ordered. Plt WNL. No active bleed issues reported.  Goal of Therapy:  Heparin level 0.3-0.7 units/ml Monitor platelets by anticoagulation protocol: Yes   Plan:  Heparin 2500 unit bolus x 1 - conservative bolus with low Hg requiring PRBC transfusion, discussed with MD Start heparin at 1000 units/hr Check 6hr heparin level Monitor daily CBC, s/sx bleeding - watch Hg trend closely F/u CTA results   Leia Alf, PharmD, BCPS Clinical Pharmacist 11/20/2022 11:42 AM

## 2022-11-20 NOTE — ED Notes (Signed)
ED TO INPATIENT HANDOFF REPORT  ED Nurse Name and Phone #: [phone] [name] Name/Age/Gender Sonya Garner 42 y.o. female Room/Bed: 029C/029C  Code Status   Code Status: Full Code  Home/SNF/Other Home Patient oriented to: self, place, time, and situation Is this baseline? Yes   Triage Complete: Triage complete  Chief Complaint Symptomatic anemia [D64.9]  Triage Note Pt BIB [facility] with reports of left leg pain that started 2 weeks ago. Pt also reports tingling in the left leg. Pt reports upon waking this morning she noticed she had urinated on herself as well. Denies injury, denies back pain.    Allergies No Known Allergies  Level of Care/Admitting Diagnosis ED Disposition     ED Disposition  Admit   Condition  --   Comment  Hospital Area: [facility] [[id number]]  Level of Care: Progressive [102]  Admit to Progressive based on following criteria: CARDIOVASCULAR & THORACIC of moderate stability with acute coronary syndrome symptoms/low risk myocardial infarction/hypertensive urgency/arrhythmias/heart failure potentially compromising stability and stable post cardiovascular intervention patients.  May admit patient to [facility] or [facility] if equivalent level of care is available:: No  Covid Evaluation: Asymptomatic - no recent exposure (last 10 days) testing not required  Diagnosis: Symptomatic anemia [4782956]  Admitting Physician: Ginnie Smart [[id number]]  Attending Physician: Ninetta Lights, JEFFREY C [[id number]]  Certification:: I certify this patient will need inpatient services for at least 2 midnights  Estimated Length of Stay: 2          B Medical/Surgery History Past Medical History:  Diagnosis Date   Chronic kidney disease    kidney stone   Past Surgical History:  Procedure Laterality Date   TUBAL LIGATION       A IV Location/Drains/Wounds Patient Lines/Drains/Airways Status     Active Line/Drains/Airways     Name Placement date  Placement time Site Days   Peripheral IV 11/20/22 20 G 1.16" Left Antecubital 11/20/22  0820  Antecubital  less than 1   Peripheral IV 11/20/22 20 G 1.16" Right Antecubital 11/20/22  1219  Antecubital  less than 1            Intake/Output Last 24 hours No intake or output data in the 24 hours ending 11/20/22 1253  Labs/Imaging Results for orders placed or performed during the hospital encounter of 11/20/22 (from the past 48 hour(s))  CBC with Differential     Status: Abnormal   Collection Time: 11/20/22  8:20 AM  Result Value Ref Range   WBC 7.8 4.0 - 10.5 K/uL   RBC 3.23 (L) 3.87 - 5.11 MIL/uL   Hemoglobin 5.2 (LL) 12.0 - 15.0 g/dL    Comment: Reticulocyte Hemoglobin testing may be clinically indicated, consider ordering this additional test [id number] THIS CRITICAL RESULT HAS VERIFIED AND BEEN CALLED TO Nadalyn Deringer,RN BY [employer] ON 05 07 2024 AT 0919, AND HAS BEEN READ BACK.     HCT 18.7 (L) 36.0 - 46.0 %   MCV 57.9 (L) 80.0 - 100.0 fL   MCH 16.1 (L) 26.0 - 34.0 pg   MCHC 27.8 (L) 30.0 - 36.0 g/dL   RDW 84.6 (H) 96.2 - 95.2 %   Platelets 191 150 - 400 K/uL    Comment: REPEATED TO VERIFY   nRBC 0.5 (H) 0.0 - 0.2 %   Neutrophils Relative % 54 %   Neutro Abs 4.2 1.7 - 7.7 K/uL   Lymphocytes Relative 25 %   Lymphs Abs 2.0 0.7 -  4.0 K/uL   Monocytes Relative 18 %   Monocytes Absolute 1.4 (H) 0.1 - 1.0 K/uL   Eosinophils Relative 2 %   Eosinophils Absolute 0.2 0.0 - 0.5 K/uL   Basophils Relative 1 %   Basophils Absolute 0.1 0.0 - 0.1 K/uL   WBC Morphology MORPHOLOGY UNREMARKABLE    RBC Morphology See Note    Smear Review MORPHOLOGY UNREMARKABLE    Immature Granulocytes 0 %   Abs Immature Granulocytes 0.03 0.00 - 0.07 K/uL   Dimorphism PRESENT     Comment: Performed at [facility], [address], [place], [place] [address]  Basic metabolic panel     Status: Abnormal   Collection Time: 11/20/22  8:20 AM  Result Value Ref Range   Sodium 133 (L) 135  - 145 mmol/L   Potassium 3.8 3.5 - 5.1 mmol/L   Chloride 101 98 - 111 mmol/L   CO2 22 22 - 32 mmol/L   Glucose, Bld 101 (H) 70 - 99 mg/dL    Comment: Glucose reference range applies only to samples taken after fasting for at least 8 hours.   BUN 9 6 - 20 mg/dL   Creatinine, Ser 6.04 0.44 - 1.00 mg/dL   Calcium 8.9 8.9 - 54.0 mg/dL   GFR, Estimated >98 >11 mL/min    Comment: (NOTE) Calculated using the CKD-EPI Creatinine Equation (2021)    Anion gap 10 5 - 15    Comment: Performed at [facility], [address] [place], [place] [address]  Troponin I (High Sensitivity)     Status: None   Collection Time: 11/20/22  8:20 AM  Result Value Ref Range   Troponin I (High Sensitivity) 4 <18 ng/L    Comment: (NOTE) Elevated high sensitivity troponin I (hsTnI) values and significant  changes across serial measurements may suggest ACS but many other  chronic and acute conditions are known to elevate hsTnI results.  Refer to the "Links" section for chest pain algorithms and additional  guidance. Performed at [facility], [address], [place], [place] [address]   Folate     Status: None   Collection Time: 11/20/22  8:20 AM  Result Value Ref Range   Folate 6.4 >5.9 ng/mL    Comment: Performed at [facility], [address], [place], [place] [address]  Reticulocytes     Status: Abnormal   Collection Time: 11/20/22  8:20 AM  Result Value Ref Range   Retic Ct Pct 1.9 0.4 - 3.1 %   RBC. 3.17 (L) 3.87 - 5.11 MIL/uL   Retic Count, Absolute 61.5 19.0 - 186.0 K/uL   Immature Retic Fract 38.0 (H) 2.3 - 15.9 %    Comment: Performed at [facility], [address], [place], [place] [address]  I-Stat beta hCG blood, ED ([facility], [facility], [facility] only)     Status: None   Collection Time: 11/20/22  8:38 AM  Result Value Ref Range   I-stat hCG, quantitative <5.0 <5 mIU/mL   Comment 3            Comment:   GEST. AGE      CONC.  (mIU/mL)   <=1 WEEK        5 - 50     2 WEEKS       50 -  500     3 WEEKS       100 - 10,000     4 WEEKS  1,000 - 30,000        FEMALE AND NON-PREGNANT FEMALE:     LESS THAN 5 mIU/mL   Type and screen  [facility]     Status: None (Preliminary result)   Collection Time: 11/20/22 10:00 AM  Result Value Ref Range   ABO/RH(D) O POS    Antibody Screen NEG    Sample Expiration 11/23/2022,2359    Unit Number [id number]    Blood Component Type RBC LR PHER1    Unit division 00    Status of Unit ISSUED    Transfusion Status OK TO TRANSFUSE    Crossmatch Result      Compatible Performed at [facility], [address], [place], [place] [address]   Prepare RBC (crossmatch)     Status: None   Collection Time: 11/20/22 10:00 AM  Result Value Ref Range   Order Confirmation      ORDER PROCESSED BY BLOOD BANK Performed at [facility], [address], [place], [place] [address]   ABO/Rh     Status: None   Collection Time: 11/20/22 10:20 AM  Result Value Ref Range   ABO/RH(D)      O POS Performed at [facility], [address] [place], [place] [address]   POC occult blood, ED     Status: None   Collection Time: 11/20/22 10:27 AM  Result Value Ref Range   Fecal Occult Bld NEGATIVE NEGATIVE  Troponin I (High Sensitivity)     Status: None   Collection Time: 11/20/22 10:45 AM  Result Value Ref Range   Troponin I (High Sensitivity) 3 <18 ng/L    Comment: (NOTE) Elevated high sensitivity troponin I (hsTnI) values and significant  changes across serial measurements may suggest ACS but many other  chronic and acute conditions are known to elevate hsTnI results.  Refer to the "Links" section for chest pain algorithms and additional  guidance. Performed at [facility], [address], [place], [place] [address]   Technologist smear review     Status: None   Collection Time: 11/20/22 10:45 AM  Result Value Ref Range   WBC MORPHOLOGY MORPHOLOGY UNREMARKABLE    RBC MORPHOLOGY Dimorphism present     Plt Morphology MORPHOLOGY UNREMARKABLE    Clinical Information anemia     Comment: Performed at [facility], [address], [place], [place] [address]  Lactate dehydrogenase     Status: None   Collection Time: 11/20/22 10:45 AM  Result Value Ref Range   LDH 148 98 - 192 U/L    Comment: Performed at [facility], [address], [place], [place] [address]  Hepatic function panel     Status: Abnormal   Collection Time: 11/20/22 10:45 AM  Result Value Ref Range   Total Protein 6.5 6.5 - 8.1 g/dL   Albumin 3.3 (L) 3.5 - 5.0 g/dL   AST 20 15 - 41 U/L   ALT 25 0 - 44 U/L   Alkaline Phosphatase 41 38 - 126 U/L   Total Bilirubin 0.4 0.3 - 1.2 mg/dL   Bilirubin, Direct <1.3 0.0 - 0.2 mg/dL   Indirect Bilirubin NOT CALCULATED 0.3 - 0.9 mg/dL    Comment: Performed at [facility], [address], [place], [place] [address]   MR Lumbar Spine W Wo Contrast  Result Date: 11/20/2022 CLINICAL DATA:  Low back pain radiating to the left leg. EXAM: MRI LUMBAR SPINE WITHOUT  AND WITH CONTRAST TECHNIQUE: Multiplanar and multiecho pulse sequences of the lumbar spine were obtained without and with intravenous contrast. CONTRAST:  6mL GADAVIST GADOBUTROL 1 MMOL/ML IV SOLN COMPARISON:  None Available. FINDINGS: Segmentation: Standard; the lowest formed disc space is designated L5-S1. Alignment:  Normal. Vertebrae: Vertebral body heights are preserved. Background marrow signal is normal. There is no suspicious marrow signal abnormality or marrow edema. There is no abnormal marrow enhancement. Conus medullaris and cauda equina: Conus extends to the L1-L2 level. Conus and cauda equina appear normal. Paraspinal and other soft tissues: A left renal cyst is noted requiring no specific imaging follow-up. The uterus is enlarged with numerous fibroids. The paraspinal soft tissues are unremarkable. Disc levels: The disc heights are preserved. There is no significant disc herniation. There is minimal left facet  arthropathy at L5-S1 with a small posteriorly projecting synovial cyst (5-13). The facet joints are otherwise normal. There is no significant spinal canal or neural foraminal stenosis. There is no evidence of nerve root impingement. IMPRESSION: 1. Essentially normal appearance of the lumbar spine with no acute finding or significant degenerative change to explain the patient's symptoms. 2. Enlarged fibroid uterus. Electronically Signed   By: Lesia Hausen M.D.   On: 11/20/2022 09:40   VAS [place] LOWER EXTREMITY VENOUS (DVT) (7a-7p)  Result Date: 11/20/2022  Lower Venous DVT Study Patient Name:  GABRIELA WARFIELD  Date of Exam:   11/20/2022 Medical Rec #: [id number]         Accession #:    [id number] Date of Birth: Oct 31, 1980         Patient Gender: F Patient Age:   65 years Exam Location:  [facility] Procedure:      VAS [place] LOWER EXTREMITY VENOUS (DVT) Referring Phys: Claudette Stapler --------------------------------------------------------------------------------  Indications: Left lower extremity pain.  Limitations: Patient pain with compression. Comparison Study: No prior studies. Performing Technologist: Jean Rosenthal RDMS, RVT  Examination Guidelines: A complete evaluation includes B-mode imaging, spectral Doppler, color Doppler, and power Doppler as needed of all accessible portions of each vessel. Bilateral testing is considered an integral part of a complete examination. Limited examinations for reoccurring indications may be performed as noted. The reflux portion of the exam is performed with the patient in reverse Trendelenburg.  +-----+---------------+---------+-----------+----------+--------------+ RIGHTCompressibilityPhasicitySpontaneityPropertiesThrombus Aging +-----+---------------+---------+-----------+----------+--------------+ CFV  Full           Yes      Yes                                 +-----+---------------+---------+-----------+----------+--------------+    +---------+---------------+---------+-----------+----------+--------------+ LEFT     CompressibilityPhasicitySpontaneityPropertiesThrombus Aging +---------+---------------+---------+-----------+----------+--------------+ CFV      Full           Yes      Yes                                 +---------+---------------+---------+-----------+----------+--------------+ SFJ      Full                                                        +---------+---------------+---------+-----------+----------+--------------+ FV Prox  Full                                                        +---------+---------------+---------+-----------+----------+--------------+  FV Mid   Full                                                        +---------+---------------+---------+-----------+----------+--------------+ FV DistalFull                                                        +---------+---------------+---------+-----------+----------+--------------+ PFV      Full                                                        +---------+---------------+---------+-----------+----------+--------------+ POP      None           No       No                   Acute          +---------+---------------+---------+-----------+----------+--------------+ PTV      Partial        Yes      Yes                  Acute          +---------+---------------+---------+-----------+----------+--------------+ PERO     Partial        Yes      Yes                  Acute          +---------+---------------+---------+-----------+----------+--------------+ Gastroc  None           No       No                   Acute          +---------+---------------+---------+-----------+----------+--------------+    Summary: RIGHT: - No evidence of common femoral vein obstruction.  LEFT: - Findings consistent with acute deep vein thrombosis involving the left popliteal vein, left gastrocnemius veins, left  posterior tibial veins, and left peroneal veins. - No cystic structure found in the popliteal fossa.  *See table(s) above for measurements and observations.    Preliminary    DG Chest Port 1 View  Result Date: 11/20/2022 CLINICAL DATA:  Shortness of breath. EXAM: PORTABLE CHEST 1 VIEW COMPARISON:  None Available. FINDINGS: Clear lungs. Normal heart size and mediastinal contours. No pleural effusion or pneumothorax. Visualized bones and upper abdomen are unremarkable. IMPRESSION: No evidence of acute cardiopulmonary disease. Electronically Signed   By: Orvan Falconer M.D.   On: 11/20/2022 08:54    Pending Labs Unresulted Labs (From admission, onward)     Start     Ordered   11/21/22 0500  HIV Antibody (routine testing w rflx)  (HIV Antibody (Routine testing w reflex) panel)  Tomorrow morning,   R        11/20/22 1137   11/21/22 0500  Heparin level (unfractionated)  Daily,   R      11/20/22 1207   11/21/22 0500  CBC  Daily,   R  11/20/22 1207   11/20/22 1900  Heparin level (unfractionated)  Once-Timed,   TIMED        11/20/22 1207   11/20/22 1145  Haptoglobin  Once,   R        11/20/22 1144   11/20/22 0946  Vitamin B12  (Anemia Panel (PNL))  Once,   URGENT        11/20/22 0945   11/20/22 0946  Iron and TIBC  (Anemia Panel (PNL))  Once,   URGENT        11/20/22 0945   11/20/22 0946  Ferritin  (Anemia Panel (PNL))  Once,   URGENT        11/20/22 0945            Vitals/Pain Today's Vitals   11/20/22 1130 11/20/22 1200 11/20/22 1215 11/20/22 1240  BP: 110/66 122/79    Pulse: 94 (!) 108  97  Resp: (!) 24 (!) 21  (!) 23  Temp:   99.3 F (37.4 C) 99.3 F (37.4 C)  TempSrc:   Oral   SpO2: 100% 100%  100%  Weight:      Height:      PainSc:        Isolation Precautions No active isolations  Medications Medications  lidocaine (LIDODERM) 5 % 1 patch (1 patch Transdermal Patch Applied 11/20/22 0945)  heparin ADULT infusion 100 units/mL (25000 units/223mL) (1,000 Units/hr  Intravenous New Bag/Given 11/20/22 1236)  morphine (PF) 4 MG/ML injection 4 mg (4 mg Intravenous Given 11/20/22 0833)  gadobutrol (GADAVIST) 1 MMOL/ML injection 6 mL (6 mLs Intravenous Contrast Given 11/20/22 0932)  0.9 %  sodium chloride infusion (Manually program via Guardrails IV Fluids) ( Intravenous New Bag/Given 11/20/22 1232)  heparin bolus via infusion 2,500 Units (2,500 Units Intravenous Bolus from Bag 11/20/22 1237)    Mobility walks with device     Focused Assessments Neuro Assessment Handoff:  Swallow screen pass? Yes          Neuro Assessment:   Neuro Checks:      Has TPA been given? No If patient is a Neuro Trauma and patient is going to OR before floor call report to 4N Charge nurse: [phone] or [phone]   R Recommendations: See Admitting Provider Note  Report given to:   Additional Notes: patient has heparin running for LLE DVT. Also has blood transfusing for hgb of 5.3 (hx of anemia, no active bleeding).

## 2022-11-20 NOTE — Progress Notes (Signed)
OT Cancellation Note  Patient Details Name: EMAROSA KALT MRN: [id number] DOB: [birth year]   Cancelled Treatment:    Reason Eval/Treat Not Completed: Medical issues which prohibited therapy Pt with acute LLE DVT, started on heparin. Also noted with low hgb. Will hold therapy attempts today and follow up tomorrow.  Lorre Munroe 11/20/2022, 2:10 PM

## 2022-11-20 NOTE — ED Notes (Signed)
Consent for blood products signed at this time.  

## 2022-11-20 NOTE — H&P (Addendum)
NAME:  KIANNY DEVENEY, MRN:  [id number], DOB:  Dec 14, 1980, LOS: 0 ADMISSION DATE:  11/20/2022, Primary: Patient, No Pcp Per  CHIEF COMPLAINT:  leg pain   Medical Service: Internal Medicine Teaching Service         Attending Physician: Dr. Ginnie Smart, MD    First Contact: Dr. Aundria Rud Pager: [phone]  Second Contact: Dr. Sloan Leiter Pager: [phone]       After Hours (After 5p/  First Contact Pager: [phone]  weekends / holidays): Second Contact Pager: [phone]   HISTORY OF PRESENT ILLNESS   Anmarie Gharibian is 42yo person without significant medical history presenting to [facility] today for left lower leg pain. She reports these symptoms started roughly two weeks ago without any known preceding event. Describes sharp pain from her mid-thigh down to her heel and intermittent numbness/tingling, symptoms typically worsen with standing and walking. She has been able to move around some, but has tried to stay sedentary the majority of the time. She was evaluated in the ED for her leg pain last week and was discharged with a course of steroids. She mentions that since then her symptoms have not improved. At the same time the leg pain started, she reports she has been intermittently lightheaded/dizzy and dyspneic upon standing. Notes that at rest she feels she is subjectively breathing a bit harder than normal. Denies any changes in bowel movements, melena, hematochezia, hematuria, hemoptysis, hematemesis, abdominal pain, nausea, vomiting. Further denies recent fever, chills, chest pains, palpitations, changes in urine. She has regular menstrual cycles without any other bleeding. Her last menstrual cycle was roughly one month ago. She does note recently her periods have been a bit more heavy than previously and her cramps have worsened. Prior to any of these symptoms, patient was at her baseline, completely asymptomatic and doing all of her regular activities without difficulty. She does report she was told when  she was young that she was anemic, however she has not taken any supplements. She reports multiple cancers on the maternal side of her family (including breast and prostate) but denies any known autoimmune or clotting disorders.   PCP: Patient, No Pcp Per  ED COURSE   Patient arrived to [facility] afebrile, mildly tachycardic, but otherwise hemodynamically stable without hypoxia. Initial lab work indicated severe microcytic anemia with Hgb 5.2, normal platelets, normal renal function, no electrolyte changes. MRI lumbar spine normal without evidence of stenosis or degenerative changes. Ultrasound of left lower extremity revealed acute DVT affecting popliteal, gastrocnemius, posterior tibial, and peroneal veins. IMTS subsequently consulted for admission for symptomatic anemia and acute DVT.   PAST MEDICAL HISTORY   No significant medical history.   HOME MEDICATIONS   No daily medications.  ALLERGIES   Allergies as of 11/20/2022   (No Known Allergies)    SOCIAL HISTORY   Patient lives in [place] with her husband. At baseline can complete her ADL and IADL's without difficulty. She does report smoking ~1 pack of cigarettes per week fo the last 20 years. Denies any alcohol or drug use.   FAMILY HISTORY   Her family history includes Cancer in her maternal aunt, maternal grandmother, and maternal uncle. There is no history of Hearing loss.   REVIEW OF SYSTEMS   ROS per history of present illness.  PHYSICAL EXAMINATION   Blood pressure 104/89, pulse 99, temperature 98.8 F (37.1 C), temperature source Oral, resp. rate 17, height 5\' 4"  (1.626 m), weight 61.7 kg, last menstrual period 10/24/2022, SpO2 100 %.  Filed Weights   11/20/22 0806  Weight: 61.7 kg   GENERAL: Resting comfortably in bed in no acute distress HENT: Normocephalic, atraumatic. Moist mucous membranes.  EYES: Vision grossly in tact. No scleral icterus or conjunctival injection noted. CV: Tachycardic, regular rhythm.  No murmurs appreciated. Dorsalis pedis pulses 2+ bilaterally.  PULM: Normal work of breathing on room air. Clear to auscultation bilaterally.  GI: Abdomen soft, non-tender, non-distended. Normoactive bowel sounds. MSK: Left lower extremity appears mildly bigger than right without any warmth or erythema noted.  SKIN: Warm, dry. No rashes or lesions noted.  NEURO: Awake, alert, conversing appropriately. Cranial nerves grossly in tact. RLE 5/5 strength, LLE 3/5 strength (limited by pain). Sensation is in tact bilateral lower extremities, slightly less sensation throughout left leg. PSYCH: Normal mood, affect, speech.   SIGNIFICANT DIAGNOSTIC TESTS   ECG: pending  I personally reviewed patient's ECG with my interpretation as above.  CXR: Normal appearing lungs, no vascular congestion, effusions, consolidations.   I personally reviewed patient's CXR with my interpretation as above.  LABS      Latest Ref Rng & Units 11/20/2022    8:20 AM 11/25/2013    4:02 PM 03/22/2009    6:50 PM  CBC  WBC 4.0 - 10.5 K/uL 7.8  4.4  10.3   Hemoglobin 12.0 - 15.0 g/dL 5.2  16.1  09.6   Hematocrit 36.0 - 46.0 % 18.7  32.9  32.1   Platelets 150 - 400 K/uL 191  236  196       Latest Ref Rng & Units 11/20/2022    8:20 AM  BMP  Glucose 70 - 99 mg/dL 045   BUN 6 - 20 mg/dL 9   Creatinine 4.09 - 8.11 mg/dL 9.14   Sodium 782 - 956 mmol/L 133   Potassium 3.5 - 5.1 mmol/L 3.8   Chloride 98 - 111 mmol/L 101   CO2 22 - 32 mmol/L 22   Calcium 8.9 - 10.3 mg/dL 8.9     CONSULTS   N/a  ASSESSMENT   Mishell Neumaier is 42yo person without known significant medical history admitted to [facility] 5/7 with symptomatic anemia and acute deep vein thrombosis.   PLAN   Principal Problem:   Symptomatic anemia  #Symptomatic microcytic anemia #Uterine fibroids Patient is mildly tachycardic, but otherwise not hypoxic and blood pressure is stable. On arrival Hgb 5.2, last known Hgb 10.8 in 2015. She does appear chronically  anemic dating back to at least 2010. Her initial lab work showed significantly low MCV of 57 and RDW 34, almost assuredly having some degree of iron deficiency - we are pending these studies. I also wonder if she has an underlying thalassemia as well.  Thankfully, her platelets and leukocytes are normal.Her LMP was four weeks ago and denies vaginal bleeding since then. She does report heavier bleeding with periods and was noted to have enlarged uterus with multiple fibroids on MRI. She denies any other bleeding symptoms and is not having any GI symptoms. I suspect she has chronic anemia that has probably worsened over the last few months given her increased vaginal bleeding, likely 2/2 fibroids. Reticulocyte index is low, furthering my suspicion this is more of a chronic problem. Given she also has an acute DVT, I am also going to rule out hemolysis. We have ordered two units of pRBC, we will follow-up post-transfusion H/H. As an outpatient will need to have up to date cancer screenings as well.  - Receiving 2u pRBC, follow-up post-transfusion  H/H - Follow-up iron studies - Follow-up hemolysis labs  - Follow-up with OBGYN for fibroid management - Follow-up with [facility] after discharge  #Acute provoked DVT Patient has had left leg pain and numbness/tingling over the last two weeks without any preceding events. She denies any recent travel, surgery, or previous clots. She does work at a computer the majority of the time, however there have not been any acute changes to this over the last few months. She has been more sedentary over the last few weeks since her symptoms began. However, I believe the clot was likely the cause of the leg pain. Given she is largely sedentary with her work, I think this can reasonably be treated as a provoked DVT with anticoagulation planned for 3-6 months. Given she is mildly tachycardic and subjectively dyspneic at rest, I am going to get a CTA chest to rule out a pulmonary embolism.  I believe starting anticoagulation would be appropriate now, given I do not believe she is having acute bleeding and she has a large clot burden. Electing to start with heparin gtt given this would be easiest to reverse in case of a bleed. If stable over next 24 hours would consider switching to DOAC. If she is found to have an acute bleed could consider IR consult for IVC filter. - Follow-up CTA chest to rule out PE - Start heparin gtt - Follow-up ECG - Tylenol 1000mg  every 6 hours - Oxycodone 5mg  every 4 hours as needed - Lidocaine patch - Needs age appropriate cancer screenings - PT/OT  BEST PRACTICE   DIET: Reg IVF: n/a DVT PPX: heparin gtt BOWEL: senokot PRN CODE: FULL FAM COM: discussed with patient and husband at bedside  DISPO: Admit patient to Inpatient with expected length of stay greater than 2 midnights.  Evlyn Kanner, MD Internal Medicine Resident PGY-3 Pager [phone] 11/19/22 11:40 AM

## 2022-11-20 NOTE — ED Triage Notes (Signed)
Pt BIB [facility] with reports of left leg pain that started 2 weeks ago. Pt also reports tingling in the left leg. Pt reports upon waking this morning she noticed she had urinated on herself as well. Denies injury, denies back pain.

## 2022-11-21 ENCOUNTER — Other Ambulatory Visit (HOSPITAL_COMMUNITY): Payer: Self-pay

## 2022-11-21 ENCOUNTER — Telehealth (HOSPITAL_COMMUNITY): Payer: Self-pay | Admitting: Pharmacy Technician

## 2022-11-21 DIAGNOSIS — I824Z2 Acute embolism and thrombosis of unspecified deep veins of left distal lower extremity: Secondary | ICD-10-CM | POA: Insufficient documentation

## 2022-11-21 DIAGNOSIS — I2699 Other pulmonary embolism without acute cor pulmonale: Secondary | ICD-10-CM | POA: Insufficient documentation

## 2022-11-21 LAB — BPAM RBC
Blood Product Expiration Date: 202406012359
Blood Product Expiration Date: 202406042359
ISSUE DATE / TIME: 202405071758

## 2022-11-21 LAB — TYPE AND SCREEN
Antibody Screen: NEGATIVE
Unit division: 0
Unit division: 0

## 2022-11-21 LAB — CBC
HCT: 26.6 % — ABNORMAL LOW (ref 36.0–46.0)
Hemoglobin: 7.9 g/dL — ABNORMAL LOW (ref 12.0–15.0)
MCH: 20.2 pg — ABNORMAL LOW (ref 26.0–34.0)
MCHC: 29.7 g/dL — ABNORMAL LOW (ref 30.0–36.0)
MCV: 67.9 fL — ABNORMAL LOW (ref 80.0–100.0)
Platelets: 114 10*3/uL — ABNORMAL LOW (ref 150–400)
RBC: 3.92 MIL/uL (ref 3.87–5.11)
WBC: 8.8 10*3/uL (ref 4.0–10.5)
nRBC: 0.3 % — ABNORMAL HIGH (ref 0.0–0.2)

## 2022-11-21 LAB — HEMOGLOBIN AND HEMATOCRIT, BLOOD
HCT: 26.3 % — ABNORMAL LOW (ref 36.0–46.0)
Hemoglobin: 8 g/dL — ABNORMAL LOW (ref 12.0–15.0)

## 2022-11-21 LAB — HIV ANTIBODY (ROUTINE TESTING W REFLEX): HIV Screen 4th Generation wRfx: NONREACTIVE

## 2022-11-21 LAB — HEPARIN LEVEL (UNFRACTIONATED): Heparin Unfractionated: 0.24 IU/mL — ABNORMAL LOW (ref 0.30–0.70)

## 2022-11-21 MED ORDER — APIXABAN 5 MG PO TABS: 5.0000 mg | ORAL_TABLET | Freq: Two times a day (BID) | ORAL | Status: DC

## 2022-11-21 MED ORDER — SODIUM CHLORIDE 0.9 % IV SOLN
250.0000 mg | Freq: Every day | INTRAVENOUS | Status: DC
  Administered 2022-11-21: 250 mg via INTRAVENOUS
  Filled 2022-11-21: qty 20

## 2022-11-21 MED ORDER — FERROUS SULFATE 325 (65 FE) MG PO TABS
325.0000 mg | ORAL_TABLET | Freq: Two times a day (BID) | ORAL | 0 refills | Status: DC
  Filled 2022-11-21: qty 100, 50d supply, fill #0

## 2022-11-21 MED ORDER — SODIUM CHLORIDE 0.9 % IV SOLN: 500.0000 mg | Freq: Once | INTRAVENOUS | Status: DC

## 2022-11-21 MED ORDER — APIXABAN 5 MG PO TABS
10.0000 mg | ORAL_TABLET | Freq: Two times a day (BID) | ORAL | Status: DC
  Administered 2022-11-21: 10 mg via ORAL
  Filled 2022-11-21: qty 2

## 2022-11-21 MED ORDER — APIXABAN (ELIQUIS) VTE STARTER PACK (10MG AND 5MG)
ORAL_TABLET | ORAL | 0 refills | Status: DC
  Filled 2022-11-21: qty 74, 30d supply, fill #0

## 2022-11-21 NOTE — Plan of Care (Signed)
  Problem: Education: Goal: Knowledge of General Education information will improve Description Including pain rating scale, medication(s)/side effects and non-pharmacologic comfort measures Outcome: Progressing   Problem: Health Behavior/Discharge Planning: Goal: Ability to manage health-related needs will improve Outcome: Progressing   

## 2022-11-21 NOTE — Telephone Encounter (Signed)
Pharmacy Patient Advocate Encounter  Insurance verification completed.    The patient is insured through [employer]   The patient is currently admitted and ran test claims for the following: Eliquis, Xarelto.  Copays and coinsurance results were relayed to Inpatient clinical team.      

## 2022-11-21 NOTE — Progress Notes (Signed)
ANTICOAGULATION CONSULT NOTE  Pharmacy Consult for heparin Indication: Bilateral PE  Heparin Dosing Weight: 61.7 kg  Labs: Recent Labs    11/20/22 0820 11/20/22 2348  HGB 5.2* 8.0*  HCT 18.7* 26.3*  PLT 191  --   HEPARINUNFRC  --  0.24*  CREATININE 0.64  --      Assessment: 42 yof presenting with leg pain, found to have acute LLE DVT. CTA pending. Patient is not on anticoagulation PTA. Noted Hg low at 5.2 - PRBC ordered. Plt WNL. No active bleed issues reported.  5/8 AM update:  Heparin level sub-therapeutic PE is confirmed by CT  Hgb up s/p transfusion, no active bleeding noted  Goal of Therapy:  Heparin level 0.3-0.7 units/ml Monitor platelets by anticoagulation protocol: Yes   Plan:  Inc heparin to 1150 units/hr 1000 heparin level  Sonya Garner, PharmD, BCPS Clinical Pharmacist Phone: [phone]

## 2022-11-21 NOTE — TOC Initial Note (Signed)
Transition of Care [facility]) - Initial/Assessment Note    Patient Details  Name: MBER GROSHANS MRN: [id number] Date of Birth: Jul 10, 1981  Transition of Care [facility]) CM/SW Contact:    Ronny Bacon, RN Phone Number: 11/21/2022, 3:19 PM  Clinical Narrative:  Case manager received update from PT that patient will need DME and home health services. Spoke with patient regarding requirements for [facility] therapy, patient has to be homebound for at least 3 weeks. 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 Educated patient that she will have a copay for each [facility] visit. Plan for patient to discharge home with DME rolling walker and bedside commode. Patient is agreeable to use [employer] for DME needs. Referral submitted to [employer] and will be delivered to bedside prior to transition to home. After discussion patient wants to continue with outpatient physical therapy at [facility] as it close to her home. Ambulatory referral sent via EPIC and information will be placed on the AVS.              Expected Discharge Plan: Home/Self Care (outpatient therapy per patient request) Barriers to Discharge: No Barriers Identified   Patient Goals and CMS Choice Patient states their goals for this hospitalization and ongoing recovery are:: To return home   Choice offered to / list presented to : NA      Expected Discharge Plan and Services In-house Referral: NA Discharge Planning Services: CM Consult Post Acute Care Choice: NA Living arrangements for the past 2 months: Apartment Expected Discharge Date: 11/21/22               DME Arranged: Bedside commode, Walker rolling DME Agency: [employer] Date DME Agency Contacted: 11/21/22 Time DME Agency Contacted: [phone] Representative spoke with at DME Agency: Barbara Cower HH Arranged: NA          Prior Living Arrangements/Services Living arrangements for the past 2 months: Apartment Lives with:: Spouse Patient  language and need for interpreter reviewed:: Yes Do you feel safe going back to the place where you live?: Yes      Need for Family Participation in Patient Care: Yes (Comment) Care giver support system in place?: Yes (comment)   Criminal Activity/Legal Involvement Pertinent to Current Situation/Hospitalization: No - Comment as needed  Activities of Daily Living Home Assistive Devices/Equipment: None ADL Screening (condition at time of admission) Patient's cognitive ability adequate to safely complete daily activities?: Yes Is the patient deaf or have difficulty hearing?: No Does the patient have difficulty seeing, even when wearing glasses/contacts?: No Does the patient have difficulty concentrating, remembering, or making decisions?: No Patient able to express need for assistance with ADLs?: Yes Does the patient have difficulty dressing or bathing?: No Independently performs ADLs?: Yes (appropriate for developmental age) Does the patient have difficulty walking or climbing stairs?: No Weakness of Legs: None Weakness of Arms/Hands: None  Permission Sought/Granted Permission sought to share information with : Case Manager, Magazine features editor, Family Supports Permission granted to share information with : Yes, Verbal Permission Granted     Permission granted to share info w AGENCY: [employer]        Emotional Assessment Appearance:: Appears stated age Attitude/Demeanor/Rapport: Engaged Affect (typically observed): Appropriate Orientation: : Oriented to Self, Oriented to Place, Oriented to  Time, Oriented to Situation Alcohol / Substance Use: Not Applicable Psych Involvement: No (comment)  Admission diagnosis:  Acute deep vein thrombosis (DVT) of proximal vein of left lower extremity (HCC) [I82.4Y2] Symptomatic  anemia [D64.9] Patient Active Problem List   Diagnosis Date Noted   Bilateral pulmonary embolism (HCC) 11/21/2022   DVT, lower extremity, distal, acute, left  (HCC) 11/21/2022   Symptomatic anemia 11/20/2022   Mild concussion 12/05/2016   TOBACCO ABUSE 02/20/2007   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   PCP:  Patient, No Pcp Per Pharmacy:   [facility] #[id number] [place], [place] - [address] AT [facility] [address] [place] [address] Phone: [phone] Fax: [phone]  [facility] #[id number] [place], [place] - [address] AT [facility] OF [address] [place] [address] Phone: [phone] Fax: [phone]  [facility] #[id number] [place], [place] - [address] AT [facility] [address]  [place] [address] Phone: [phone] Fax: [phone]  [facility] [address] [place] [address] Phone: [phone] Fax: [phone]     Social Determinants of Health (SDOH) Social History: SDOH Screenings   Tobacco Use: High Risk (11/20/2022)   SDOH Interventions:     Readmission Risk Interventions     No data to display

## 2022-11-21 NOTE — Discharge Instructions (Signed)
You were hospitalized and found to have blood clots in your leg and lungs. You were breathing well and had no evidence of effects n the heart so you were started on blood thinners and will take these daily once you leave the hospital. We are unsure why you have clots. There is minimal work up for this given that they are not in atypical locations and you dont have a family history. The only real work up is to make sure you are up to date on all of your age appropriate cancer screenings such as pap smears and mammograms. We also found that you have low blood counts and iron deficiency anemia. This is likely from heavy menstrual bleeding due to fibroids. We gave you blood and IV iron. You will take iron at discharge. We will refer you to the OBGYN to evaluate your fibroids. At home:   1: Take apixaban 10mg  twice daily for 6 days (11/22/2022-11/27/2022), then starting 11/28/2022 take apixaban 5mg  twice daily   2. Take oral iron 325mg  one tablet twice daily every day. You can take this every other day if you have constipation.

## 2022-11-21 NOTE — Hospital Course (Addendum)
Pain is better compared to yesterday. No chest pain, dyspnea, lightheadednes. Endorses unintentional weight loss and lack of appetite that has been going on for more than a week. Also having night sweats for about 2 weeks. No fevers. No hemoptysis.

## 2022-11-21 NOTE — Progress Notes (Signed)
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 Emphasis on taking the medication exactly as prescribed to treat her blood clots to include:  How she should use/take the medication, what if there were to be a missed dose, drug-drug interaction potential, signs and symptoms that might suggest her blood is too thin (blood in urine, stool, nose-bleeds, throwing up blood, coughing up blood, increased bruising, abdominal pain, severe headache, vision changes, numbness of the face, arms or leg, trouble speaking or heavy periods).  Patient was apprised that the manufacturer's $10 copay card that is ran secondary to the primary insurance to bring the cost down on the refills. Today we could possibly use the Free Month Copay Card to get them the Starter Pack Free.  Patient was given an opportunity to ask and have answered any questions. Patient education provided by Pharmacy Students, Lavera Guise and Arnell Sieving, under by supervision.

## 2022-11-21 NOTE — Care Management (Signed)
  Transition of Care [facility]) Screening Note   Patient Details  Name: BUFORD CATER Date of Birth: March 16, 1981   Transition of Care [facility]) CM/SW Contact:    Ronny Bacon, RN Phone Number: 11/21/2022, 10:16 AM    Transition of Care Department [facility]) has reviewed patient. Benefit check completed for Eliquis completed. Received information from pharmacy that team may be considering changing to Warfarin. We will continue to monitor patient advancement through interdisciplinary progression rounds. If new patient transition needs arise, please place a TOC consult.

## 2022-11-21 NOTE — Care Management (Signed)
    Durable Medical Equipment  (From admission, onward)           Start     Ordered   11/21/22 1538  For home use only DME Bedside commode  Once       Comments: Confined to one level of the home.  Question:  Patient needs a bedside commode to treat with the following condition  Answer:  Symptomatic anemia   11/21/22 1539   11/21/22 1457  For home use only DME Walker rolling  Once       Question Answer Comment  Walker: With 5 Inch Wheels   Patient needs a walker to treat with the following condition Dyspnea on minimal exertion      11/21/22 1500   11/21/22 1457  For home use only DME Bedside commode  Once       Question Answer Comment  Patient needs a bedside commode to treat with the following condition Physical deconditioning   Patient needs a bedside commode to treat with the following condition Dyspnea on minimal exertion      11/21/22 1500   11/21/22 1457  For home use only DME Shower stool  Once        11/21/22 1500

## 2022-11-21 NOTE — Evaluation (Signed)
Physical Therapy Evaluation Patient Details Name: AVALIN DENZLER MRN: [id number] DOB: 15-Apr-1981 Today's Date: 11/21/2022  History of Present Illness  Pt is a 42 year old female admitted on 11/20/22 for LLE pain. Pt has no significant PMH however during admission was found to have bilateral PE and LLE DVT.  Clinical Impression  Pt presents with admitting diagnosis above. Pt reports that prior to 2-3 weeks ago she was independent with all mobility however with onset of LLE pain pt states that she required help with all mobility and ADLs. Today, pt was very limited by pain reporting 10/10 LLE pain ambulating short distance in room with RW with +2 Min A. Pt HR up to 122 with mobility and RN made aware. Pt currently lives in a second floor apartment with no elevator access and based on mobility today she is not safe to return home at this time. If pt were to return home today, recommend HHPT with RW and BSC 3-in-1 however anticipate that once pt pain is controlled and pt is medically stable then pt likely progress to no follow up PT. PT will continue to follow.       Recommendations for follow up therapy are one component of a multi-disciplinary discharge planning process, led by the attending physician.  Recommendations may be updated based on patient status, additional functional criteria and insurance authorization.  Follow Up Recommendations       Assistance Recommended at Discharge Intermittent Supervision/Assistance  Patient can return home with the following  A little help with walking and/or transfers;A little help with bathing/dressing/bathroom;Assistance with cooking/housework;Direct supervision/assist for medications management;Help with stairs or ramp for entrance;Assist for transportation    Equipment Recommendations Rolling walker (2 wheels);BSC/3in1;Wheelchair (measurements PT);Wheelchair cushion (measurements PT);Other (comment) Public relations account executive)  Recommendations for Other Services        Functional Status Assessment Patient has had a recent decline in their functional status and demonstrates the ability to make significant improvements in function in a reasonable and predictable amount of time.     Precautions / Restrictions Precautions Precautions: Fall Precaution Comments: LLE DVT, bilat PEs Restrictions Weight Bearing Restrictions: No      Mobility  Bed Mobility Overal bed mobility: Modified Independent             General bed mobility comments: HOB elevated    Transfers Overall transfer level: Needs assistance Equipment used: None, Rolling walker (2 wheels) Transfers: Sit to/from Stand Sit to Stand: Min assist           General transfer comment: Initially trialed sit to stand with no AD however pt is unable to WB through LLE. Opted for RW for safety. Cues for hand placement    Ambulation/Gait Ambulation/Gait assistance: Min guard, +2 safety/equipment Gait Distance (Feet): 10 Feet Assistive device: Rolling walker (2 wheels) Gait Pattern/deviations: Antalgic, Knee flexed in stance - left, Decreased stance time - left, Step-to pattern, Trunk flexed Gait velocity: decreased     General Gait Details: Pt with slow antalgic gait pattern. Pt unable to fully WB through LLE. Pt reported 10/10 LLE pain  Stairs            Wheelchair Mobility    Modified Rankin (Stroke Patients Only)       Balance Overall balance assessment: Needs assistance Sitting-balance support: Feet supported Sitting balance-Leahy Scale: Good     Standing balance support: Bilateral upper extremity supported, During functional activity, Reliant on assistive device for balance Standing balance-Leahy Scale: Poor Standing balance comment: Reliant on RW  Pertinent Vitals/Pain Pain Assessment Pain Assessment: No/denies pain    Home Living Family/patient expects to be discharged to:: Private residence Living Arrangements:  Spouse/significant other;Children Available Help at Discharge: Family;Available PRN/intermittently Type of Home: Apartment Home Access: Stairs to enter Entrance Stairs-Rails: Left Entrance Stairs-Number of Steps: 10   Home Layout: One level Home Equipment: None      Prior Function Prior Level of Function : Needs assist;Working/employed;Driving             Mobility Comments: Mod I furniture walking since onset of LLE pain 2-3 weeks ago. Ind no AD prior ADLs Comments: Pt reports needing assistance over last 2-3 weeks with ADLs but was Ind prior     Hand Dominance   Dominant Hand: Right    Extremity/Trunk Assessment   Upper Extremity Assessment Upper Extremity Assessment: Overall WFL for tasks assessed    Lower Extremity Assessment Lower Extremity Assessment: Defer to PT evaluation LLE Deficits / Details: DVT LLE: Unable to fully assess due to pain    Cervical / Trunk Assessment Cervical / Trunk Assessment: Normal  Communication   Communication: No difficulties  Cognition Arousal/Alertness: Awake/alert Behavior During Therapy: WFL for tasks assessed/performed Overall Cognitive Status: Within Functional Limits for tasks assessed                                          General Comments General comments (skin integrity, edema, etc.): HR to 122 with mobility    Exercises     Assessment/Plan    PT Assessment Patient needs continued PT services  PT Problem List Decreased strength;Decreased activity tolerance;Decreased balance;Decreased mobility;Decreased coordination;Decreased range of motion;Decreased knowledge of use of DME;Decreased safety awareness;Cardiopulmonary status limiting activity       PT Treatment Interventions DME instruction;Stair training;Gait training;Functional mobility training;Therapeutic activities;Therapeutic exercise;Neuromuscular re-education;Patient/family education    PT Goals (Current goals can be found in the Care  Plan section)  Acute Rehab PT Goals Patient Stated Goal: to go home PT Goal Formulation: With patient Time For Goal Achievement: 12/05/22 Potential to Achieve Goals: Fair    Frequency Min 1X/week     Co-evaluation PT/OT/SLP Co-Evaluation/Treatment: Yes Reason for Co-Treatment: Complexity of the patient's impairments (multi-system involvement);For patient/therapist safety PT goals addressed during session: Mobility/safety with mobility OT goals addressed during session: ADL's and self-care;Proper use of Adaptive equipment and DME       AM-PAC PT "6 Clicks" Mobility  Outcome Measure Help needed turning from your back to your side while in a flat bed without using bedrails?: None Help needed moving from lying on your back to sitting on the side of a flat bed without using bedrails?: None Help needed moving to and from a bed to a chair (including a wheelchair)?: A Little Help needed standing up from a chair using your arms (e.g., wheelchair or bedside chair)?: A Little Help needed to walk in hospital room?: A Lot Help needed climbing 3-5 steps with a railing? : A Lot 6 Click Score: 18    End of Session Equipment Utilized During Treatment: Gait belt Activity Tolerance: Patient limited by pain Patient left: in bed;with call bell/phone within reach;with family/visitor present Nurse Communication: Mobility status PT Visit Diagnosis: Other abnormalities of gait and mobility (R26.89);Pain Pain - Right/Left: Left Pain - part of body: Leg    Time: 1016-1040 PT Time Calculation (min) (ACUTE ONLY): 24 min   Charges:   PT Evaluation $  PT Eval Low Complexity: 1 Low PT Treatments $Gait Training: 8-22 mins        Shela Nevin, PT, DPT Acute Rehab Services [phone]   Gladys Damme 11/21/2022, 12:05 PM

## 2022-11-21 NOTE — Evaluation (Signed)
Occupational Therapy Evaluation Patient Details Name: Sonya Garner MRN: [id number] DOB: 1980-12-10 Today's Date: 11/21/2022   History of Present Illness Pt is a 42 year old female admitted on 11/20/22 for LLE pain. Pt has no significant PMH however during admission was found to have bilateral PE and LLE DVT.   Clinical Impression   Sonya Garner was evaluated s/p the above admission list. She is indep at baseline however has needed assist from family for mobility and ADLs for the last 2-3 weeks due to LLE pain. Upon evaluation she was limited by LLE pain and tightness. Overall she needed min A for mobility with heavy reliance on RW. Due to the deficits listed below she also requires min A for LB ADLs. Pt will benefit from continued acute OT services to facilitate d/c home without follow up OT.       Recommendations for follow up therapy are one component of a multi-disciplinary discharge planning process, led by the attending physician.  Recommendations may be updated based on patient status, additional functional criteria and insurance authorization.   Assistance Recommended at Discharge Intermittent Supervision/Assistance  Patient can return home with the following A little help with walking and/or transfers;A little help with bathing/dressing/bathroom;Assistance with cooking/housework;Help with stairs or ramp for entrance;Assist for transportation    Functional Status Assessment  Patient has had a recent decline in their functional status and demonstrates the ability to make significant improvements in function in a reasonable and predictable amount of time.  Equipment Recommendations  Other (comment) (pending acute progress - RW, shower chair)       Precautions / Restrictions Precautions Precautions: Fall Precaution Comments: LLE DVT, bilat PEs Restrictions Weight Bearing Restrictions: No      Mobility Bed Mobility Overal bed mobility: Modified Independent             General  bed mobility comments: HOB elevated    Transfers Overall transfer level: Needs assistance Equipment used: None, Rolling walker (2 wheels) Transfers: Sit to/from Stand Sit to Stand: Min assist           General transfer comment: Initially trialed sit to stand with no AD however pt is unable to WB through LLE. Opted for RW for safety. Cues for hand placement      Balance Overall balance assessment: Needs assistance Sitting-balance support: Feet supported Sitting balance-Leahy Scale: Good     Standing balance support: Bilateral upper extremity supported, During functional activity Standing balance-Leahy Scale: Poor                             ADL either performed or assessed with clinical judgement   ADL Overall ADL's : Needs assistance/impaired Eating/Feeding: Independent   Grooming: Set up;Sitting   Upper Body Bathing: Set up;Sitting   Lower Body Bathing: Minimal assistance;Sit to/from stand   Upper Body Dressing : Set up;Sitting   Lower Body Dressing: Minimal assistance;Sit to/from stand   Toilet Transfer: Minimal assistance;Ambulation;Regular Toilet;Rolling walker (2 wheels)   Toileting- Clothing Manipulation and Hygiene: Supervision/safety;Sitting/lateral lean       Functional mobility during ADLs: Minimal assistance General ADL Comments: limited by 10/10 LLE pain     Vision Ability to See in Adequate Light: 0 Adequate Vision Assessment?: No apparent visual deficits     Perception Perception Perception Tested?: No   Praxis Praxis Praxis tested?: Within functional limits    Pertinent Vitals/Pain Pain Assessment Pain Assessment: 0-10 Pain Score: 10-Worst pain ever Pain Location: LLE  with mobilty Pain Descriptors / Indicators: Discomfort, Grimacing Pain Intervention(s): Limited activity within patient's tolerance, Monitored during session     Hand Dominance Right   Extremity/Trunk Assessment Upper Extremity Assessment Upper  Extremity Assessment: Overall WFL for tasks assessed   Lower Extremity Assessment Lower Extremity Assessment: Defer to PT evaluation LLE Deficits / Details: DVT LLE: Unable to fully assess due to pain   Cervical / Trunk Assessment Cervical / Trunk Assessment: Normal   Communication Communication Communication: No difficulties   Cognition Arousal/Alertness: Awake/alert Behavior During Therapy: WFL for tasks assessed/performed Overall Cognitive Status: Within Functional Limits for tasks assessed                                       General Comments  HR to 122 with mobility     Home Living Family/patient expects to be discharged to:: Private residence Living Arrangements: Spouse/significant other;Children Available Help at Discharge: Family;Available PRN/intermittently Type of Home: Apartment Home Access: Stairs to enter Entrance Stairs-Number of Steps: 10 Entrance Stairs-Rails: Left Home Layout: One level     Bathroom Shower/Tub: Chief Strategy Officer: Standard     Home Equipment: None          Prior Functioning/Environment Prior Level of Function : Needs assist;Working/employed;Driving             Mobility Comments: Mod I furniture walking since onset of LLE pain 2-3 weeks ago. Ind no AD prior ADLs Comments: Pt reports needing assistance over last 2-3 weeks with ADLs but was Ind prior        OT Problem List: Decreased strength;Decreased range of motion;Decreased activity tolerance;Impaired balance (sitting and/or standing);Pain      OT Treatment/Interventions: Self-care/ADL training;Therapeutic exercise;DME and/or AE instruction;Therapeutic activities;Patient/family education;Balance training    OT Goals(Current goals can be found in the care plan section) Acute Rehab OT Goals Patient Stated Goal: less pain OT Goal Formulation: With patient Time For Goal Achievement: 12/05/22 Potential to Achieve Goals: Good ADL Goals Pt  Will Perform Lower Body Bathing: with modified independence;sit to/from stand Pt Will Perform Lower Body Dressing: with modified independence;sit to/from stand Pt Will Transfer to Toilet: with modified independence;ambulating  OT Frequency: Min 2X/week    Co-evaluation PT/OT/SLP Co-Evaluation/Treatment: Yes Reason for Co-Treatment: Complexity of the patient's impairments (multi-system involvement);For patient/therapist safety PT goals addressed during session: Mobility/safety with mobility OT goals addressed during session: ADL's and self-care;Proper use of Adaptive equipment and DME      AM-PAC OT "6 Clicks" Daily Activity     Outcome Measure Help from another person eating meals?: None Help from another person taking care of personal grooming?: A Little Help from another person toileting, which includes using toliet, bedpan, or urinal?: A Little Help from another person bathing (including washing, rinsing, drying)?: A Little Help from another person to put on and taking off regular upper body clothing?: A Little Help from another person to put on and taking off regular lower body clothing?: A Little 6 Click Score: 19   End of Session Equipment Utilized During Treatment: Gait belt;Rolling walker (2 wheels) Nurse Communication: Mobility status  Activity Tolerance: Patient limited by pain Patient left: in bed;with call bell/phone within reach;with family/visitor present  OT Visit Diagnosis: Unsteadiness on feet (R26.81);Other abnormalities of gait and mobility (R26.89);Muscle weakness (generalized) (M62.81);Pain                Time: 1191-4782 OT  Time Calculation (min): 19 min Charges:  OT General Charges $OT Visit: 1 Visit OT Evaluation $OT Eval Moderate Complexity: 1 Mod  Derenda Mis, OTR/L Acute Rehabilitation Services Office [phone] Secure Chat Communication Preferred   Donia Pounds 11/21/2022, 11:13 AM

## 2022-11-21 NOTE — Discharge Summary (Signed)
Name: Sonya Garner MRN: [id number] DOB: 12-09-1980 42 y.o. PCP: Patient, No Pcp Per  Date of Admission: 11/20/2022  7:15 AM Date of Discharge: 11/21/2022 Attending Physician: Ginnie Smart, MD  Discharge Diagnosis: 1. Principal Problem:   Symptomatic anemia Active Problems:   TOBACCO ABUSE   Bilateral pulmonary embolism (HCC)   DVT, lower extremity, distal, acute, left [facility])    Discharge Medications: Allergies as of 11/21/2022   No Known Allergies      Medication List     STOP taking these medications    diclofenac Sodium 1 % Gel Commonly known as: VOLTAREN   ibuprofen 200 MG tablet Commonly known as: ADVIL   methylPREDNISolone 4 MG Tbpk tablet Commonly known as: MEDROL DOSEPAK       TAKE these medications    Apixaban Starter Pack (10mg  and 5mg ) Commonly known as: ELIQUIS STARTER PACK Take as directed on package: start with two-5mg  tablets twice daily for 6 days starting on 11/22/2022. On day 7(11/28/2022), switch to one-5mg  tablet twice daily.   ferrous sulfate 325 (65 FE) MG tablet Take 1 tablet (325 mg total) by mouth 2 (two) times daily.        Disposition and follow-up:   [name] was discharged from [facility] in Good condition.  At the hospital follow up visit please address:  1.   Unprovoked DVT - Will need at least 3 months anticoagulation, 6 months preferred - Will need age related cancer screening: Recent pap-smear with health department needs results followed up, never had mammogram and needs one (discuss if there is utility of diagnostic vs screening given unprovoked DVT but otherwise no breast pain/mass/lymphadenopathy)  Iron deficiency anemia - **heavy menstrual bleeding with fibroid uterus on MRI, OBGYN referral** -daily or every other day iron supplementation - repeat CBC -repeat iron studies in 3 months  Tobacco use disorder -discuss cessation -at age 62 will need lung cancer screening  2.  Labs  / imaging needed at time of follow-up: CBC, mammogram  3.  Pending labs/ test needing follow-up: NA  Follow-up Appointments:  11/26/2022 8:45 AM [name] Course by problem list: 1.   Symptomatic iron deficiency anemia Uterine fibroids On arrival Hgb 5.2, last known Hgb 10.8 in 2015. She does appear chronically anemic dating back to at least 2010. Her initial lab work showed significantly low MCV of 57 and RDW 34 with low ferritin, low iron, elevated TIBC consistent with iron deficiency anemia. I also wonder if she has an underlying thalassemia as well, although chronic IDA can explain such a low MCV.  Platelet count was normal, smear was unremarkable, B12 and folate were wnl, and hemolysis labs were negative.She reported heavier bleeding with periods and was noted to have enlarged uterus with multiple fibroids on MRI. She denied any other bleeding or GI symptoms. Most likely chronic IDA from menorrhagia. Patient received 2 u pRBC during admission with hemoglobin at discharge of 7.9. She had a 1000mg  iron deficit and was given 500mg  IV iron. She was discharged with every day oral iron, also counseled on every other day if constipation is limiting. Will need OBGYN referral at discharge.    #Acute unprovoked DVT Patient has had left leg pain and numbness/tingling over the last two weeks without any preceding events and found to have LLE DVT in the popliteal, gastrocnemius, posterior tibial, peroneal veins. CT chest with bilateral L>R pulmonary emboli. Patient remained HDS, satting  well on RA throughout admission with no evidence of heart strain on CT and no elevated troponin.She denied any recent travel, surgery, or previous clots. She does work at a computer the majority of the time, however there have not been any acute changes to this over the last few months. She has been more sedentary over the last few weeks since her symptoms began. She  has a 20 pack year smoking history as well. I do not think that a desk job or the smoking alone are sufficient to provoke a DVT. She reports night sweats for the past few weeks and recent 1 week of weight loss. She denies family history of breast, cervical, colon cancer. No axillary or supraclavicular lymphadenopathy on exam.Given DVTs with no attributable cause the patient does need age related cancer screening. She got a pap smear with the health department on Monday and has never had a mammogram. Denies any GI symptoms or evidence of GI blood loss. She is not at the age for lung cancer screening given smoking history. No hypercoagulable work up indicated and no family history of DVTs. Do wonder if her fibroid uterus could be causing a May thurner like syndrome. Patient was started on heparin ggt. She was transitioned to apixaban after roughly 24 hours and discharged on this.  Subjective: Pain is better compared to yesterday. No chest pain, dyspnea, lightheadednes. Endorses unintentional weight loss and lack of appetite that has been going on for more than a week. Also having night sweats for about 2 weeks. No fevers. No hemoptysis.   Discharge Exam:   BP 113/71 (BP Location: Right Arm)   Pulse 83   Temp 97.9 F (36.6 C) (Oral)   Resp 18   Ht 5\' 4"  (1.626 m)   Wt 61.7 kg   LMP 10/24/2022 (Approximate)   SpO2 100%   BMI 23.34 kg/m  Discharge exam:  GENERAL: Resting comfortably in bed in no acute distress HEENT: Normocephalic, atraumatic. Moist mucous membranes. Pale conjunctiva. CV: Regular rate, regular rhythm. No murmurs appreciated. Dorsalis pedis pulses 2+ bilaterally.  PULM: Normal work of breathing on room air. Clear to auscultation bilaterally.  MSK: Left lower extremity appears mildly bigger than right without any warmth or erythema noted.  SKIN: Warm, dry. No rashes or lesions noted.    Pertinent Labs, Studies, and Procedures:     Latest Ref Rng & Units 11/21/2022    9:48 AM  11/20/2022   11:48 PM 11/20/2022    8:20 AM  CBC  WBC 4.0 - 10.5 K/uL 8.8   7.8   Hemoglobin 12.0 - 15.0 g/dL 7.9  8.0  5.2  C  Hematocrit 36.0 - 46.0 % 26.6  26.3  18.7   Platelets 150 - 400 K/uL 114   191     C Corrected result       Latest Ref Rng & Units 11/20/2022    8:20 AM  BMP  Glucose 70 - 99 mg/dL 161   BUN 6 - 20 mg/dL 9   Creatinine 0.96 - 0.45 mg/dL 4.09   Sodium 811 - 914 mmol/L 133   Potassium 3.5 - 5.1 mmol/L 3.8   Chloride 98 - 111 mmol/L 101   CO2 22 - 32 mmol/L 22   Calcium 8.9 - 10.3 mg/dL 8.9    VAS [place] LOWER EXTREMITY VENOUS (DVT) (7a-7p)  Result Date: 11/20/2022  Lower Venous DVT Study Patient Name:  RAIZEL PERNA  Date of Exam:   11/20/2022 Medical Rec #: [id number]  Accession #:    [id number] Date of Birth: 1980/10/07         Patient Gender: F Patient Age:   5 years Exam Location:  [facility] Procedure:      VAS [place] LOWER EXTREMITY VENOUS (DVT) Referring Phys: Claudette Stapler --------------------------------------------------------------------------------  Indications: Left lower extremity pain.  Limitations: Patient pain with compression. Comparison Study: No prior studies. Performing Technologist: Jean Rosenthal RDMS, RVT  Examination Guidelines: A complete evaluation includes B-mode imaging, spectral Doppler, color Doppler, and power Doppler as needed of all accessible portions of each vessel. Bilateral testing is considered an integral part of a complete examination. Limited examinations for reoccurring indications may be performed as noted. The reflux portion of the exam is performed with the patient in reverse Trendelenburg.  +-----+---------------+---------+-----------+----------+--------------+ RIGHTCompressibilityPhasicitySpontaneityPropertiesThrombus Aging +-----+---------------+---------+-----------+----------+--------------+ CFV  Full           Yes      Yes                                  +-----+---------------+---------+-----------+----------+--------------+   +---------+---------------+---------+-----------+----------+--------------+ LEFT     CompressibilityPhasicitySpontaneityPropertiesThrombus Aging +---------+---------------+---------+-----------+----------+--------------+ CFV      Full           Yes      Yes                                 +---------+---------------+---------+-----------+----------+--------------+ SFJ      Full                                                        +---------+---------------+---------+-----------+----------+--------------+ FV Prox  Full                                                        +---------+---------------+---------+-----------+----------+--------------+ FV Mid   Full                                                        +---------+---------------+---------+-----------+----------+--------------+ FV DistalFull                                                        +---------+---------------+---------+-----------+----------+--------------+ PFV      Full                                                        +---------+---------------+---------+-----------+----------+--------------+ POP      None           No       No  Acute          +---------+---------------+---------+-----------+----------+--------------+ PTV      Partial        Yes      Yes                  Acute          +---------+---------------+---------+-----------+----------+--------------+ PERO     Partial        Yes      Yes                  Acute          +---------+---------------+---------+-----------+----------+--------------+ Gastroc  None           No       No                   Acute          +---------+---------------+---------+-----------+----------+--------------+    Summary: RIGHT: - No evidence of common femoral vein obstruction.  LEFT: - Findings consistent with acute deep vein  thrombosis involving the left popliteal vein, left gastrocnemius veins, left posterior tibial veins, and left peroneal veins. - No cystic structure found in the popliteal fossa.  *See table(s) above for measurements and observations. Electronically signed by Lemar Livings MD on 11/20/2022 at 5:52:58 PM.    Final    CT Angio Chest Pulmonary Embolism (PE) W or WO Contrast  Addendum Date: 11/20/2022   ADDENDUM REPORT: 11/20/2022 16:53 ADDENDUM: Critical Value/emergent results were called by telephone at the time of interpretation on 11/20/2022 at 4:52 pm to provider Dr. Marijo Conception, who verbally acknowledged these results. Electronically Signed   By: Lupita Raider M.D.   On: 11/20/2022 16:53   Result Date: 11/20/2022 CLINICAL DATA:  Dyspnea. EXAM: CT ANGIOGRAPHY CHEST WITH CONTRAST TECHNIQUE: Multidetector CT imaging of the chest was performed using the standard protocol during bolus administration of intravenous contrast. Multiplanar CT image reconstructions and MIPs were obtained to evaluate the vascular anatomy. RADIATION DOSE REDUCTION: This exam was performed according to the departmental dose-optimization program which includes automated exposure control, adjustment of the mA and/or kV according to patient size and/or use of iterative reconstruction technique. CONTRAST:  75mL OMNIPAQUE IOHEXOL 350 MG/ML SOLN COMPARISON:  None Available. FINDINGS: Cardiovascular: Bilateral pulmonary emboli are noted, with largest on the left extending into upper and lower lobe branches. RV LV ratio measures approximately 0.8 which is within normal limits. Normal cardiac size. No pericardial effusion. Mediastinum/Nodes: No enlarged mediastinal, hilar, or axillary lymph nodes. Thyroid gland, trachea, and esophagus demonstrate no significant findings. Lungs/Pleura: Lungs are clear. No pleural effusion or pneumothorax. Upper Abdomen: No acute abnormality. Musculoskeletal: No chest wall abnormality. No acute or significant osseous  findings. Review of the MIP images confirms the above findings. IMPRESSION: Bilateral pulmonary emboli are noted. Electronically Signed: By: Lupita Raider M.D. On: 11/20/2022 16:49   MR Lumbar Spine W Wo Contrast  Result Date: 11/20/2022 CLINICAL DATA:  Low back pain radiating to the left leg. EXAM: MRI LUMBAR SPINE WITHOUT AND WITH CONTRAST TECHNIQUE: Multiplanar and multiecho pulse sequences of the lumbar spine were obtained without and with intravenous contrast. CONTRAST:  6mL GADAVIST GADOBUTROL 1 MMOL/ML IV SOLN COMPARISON:  None Available. FINDINGS: Segmentation: Standard; the lowest formed disc space is designated L5-S1. Alignment:  Normal. Vertebrae: Vertebral body heights are preserved. Background marrow signal is normal. There is no suspicious marrow signal abnormality or marrow edema. There is no abnormal marrow enhancement. Conus medullaris and cauda  equina: Conus extends to the L1-L2 level. Conus and cauda equina appear normal. Paraspinal and other soft tissues: A left renal cyst is noted requiring no specific imaging follow-up. The uterus is enlarged with numerous fibroids. The paraspinal soft tissues are unremarkable. Disc levels: The disc heights are preserved. There is no significant disc herniation. There is minimal left facet arthropathy at L5-S1 with a small posteriorly projecting synovial cyst (5-13). The facet joints are otherwise normal. There is no significant spinal canal or neural foraminal stenosis. There is no evidence of nerve root impingement. IMPRESSION: 1. Essentially normal appearance of the lumbar spine with no acute finding or significant degenerative change to explain the patient's symptoms. 2. Enlarged fibroid uterus. Electronically Signed   By: Lesia Hausen M.D.   On: 11/20/2022 09:40   DG Chest Port 1 View  Result Date: 11/20/2022 CLINICAL DATA:  Shortness of breath. EXAM: PORTABLE CHEST 1 VIEW COMPARISON:  None Available. FINDINGS: Clear lungs. Normal heart size and  mediastinal contours. No pleural effusion or pneumothorax. Visualized bones and upper abdomen are unremarkable. IMPRESSION: No evidence of acute cardiopulmonary disease. Electronically Signed   By: Orvan Falconer M.D.   On: 11/20/2022 08:54     Discharge Instructions: Discharge Instructions     Diet - low sodium heart healthy   Complete by: As directed    Increase activity slowly   Complete by: As directed       You were hospitalized and found to have blood clots in your leg and lungs. You were breathing well and had no evidence of effects n the heart so you were started on blood thinners and will take these daily once you leave the hospital. We are unsure why you have clots. There is minimal work up for this given that they are not in atypical locations and you dont have a family history. The only real work up is to make sure you are up to date on all of your age appropriate cancer screenings such as pap smears and mammograms. We also found that you have low blood counts and iron deficiency anemia. This is likely from heavy menstrual bleeding due to fibroids. We gave you blood and IV iron. You will take iron at discharge. We will refer you to the OBGYN to evaluate your fibroids. At home:  1: Take apixaban 10mg  twice daily for 6 days (11/22/2022-11/27/2022), then starting 11/28/2022 take apixaban 5mg  twice daily  2. Take oral iron 325mg  one tablet twice daily every day. You can take this every other day if you have constipation. Signed: Willette Cluster, MD 11/21/2022, 1:05 PM   Pager: Willette Cluster, MD Internal Medicine Resident, PGY-1 [facility]  Pager: [phone]

## 2022-11-21 NOTE — TOC Benefit Eligibility Note (Addendum)
Patient Product/process development scientist completed.    The patient is currently admitted and upon discharge could be taking Eliquis 5 mg.  Requires Prior Authorization  The patient is currently admitted and upon discharge could be taking Xarelto 20 mg.  Requires Prior Authorization  they would probably be the same copay, A lot of [employer] require Prior Authorization on Eliquis and Xarelto. I tried [employer] just to see if I could get an estimate and she has a $75.00 deductible and the copay for it is $175.00 so I would think Eliquis and Xarelto would be the same after getting the PA   The patient is insured through [employer]   This test claim was processed through [employer]- copay amounts may vary at other pharmacies due to [employer], or as the patient moves through the different stages of their insurance plan.  Roland Earl, CPHT Pharmacy Patient Advocate Specialist [facility] Patient Advocate Team Direct Number: [phone]  Fax: [phone]

## 2022-11-21 NOTE — Care Management (Signed)
    Durable Medical Equipment  (From admission, onward)           Start     Ordered   11/21/22 1457  For home use only DME Walker rolling  Once       Question Answer Comment  Walker: With 5 Inch Wheels   Patient needs a walker to treat with the following condition Dyspnea on minimal exertion      11/21/22 1500   11/21/22 1457  For home use only DME Bedside commode  Once       Question Answer Comment  Patient needs a bedside commode to treat with the following condition Physical deconditioning   Patient needs a bedside commode to treat with the following condition Dyspnea on minimal exertion      11/21/22 1500   11/21/22 1457  For home use only DME Shower stool  Once        11/21/22 1500

## 2022-11-22 LAB — HAPTOGLOBIN: Haptoglobin: 317 mg/dL — ABNORMAL HIGH (ref 33–278)

## 2022-11-26 ENCOUNTER — Encounter: Payer: Self-pay | Admitting: Student

## 2022-11-26 ENCOUNTER — Other Ambulatory Visit: Payer: Self-pay

## 2022-11-26 ENCOUNTER — Encounter: Payer: Self-pay | Admitting: Internal Medicine

## 2022-11-26 ENCOUNTER — Ambulatory Visit (INDEPENDENT_AMBULATORY_CARE_PROVIDER_SITE_OTHER): Payer: Managed Care, Other (non HMO) | Admitting: Student

## 2022-11-26 VITALS — BP 112/77 | HR 71 | Temp 98.2°F | Resp 28 | Ht 64.0 in | Wt 135.1 lb

## 2022-11-26 DIAGNOSIS — D649 Anemia, unspecified: Secondary | ICD-10-CM

## 2022-11-26 DIAGNOSIS — Z1231 Encounter for screening mammogram for malignant neoplasm of breast: Secondary | ICD-10-CM

## 2022-11-26 DIAGNOSIS — D259 Leiomyoma of uterus, unspecified: Secondary | ICD-10-CM | POA: Diagnosis not present

## 2022-11-26 DIAGNOSIS — I824Z2 Acute embolism and thrombosis of unspecified deep veins of left distal lower extremity: Secondary | ICD-10-CM | POA: Diagnosis not present

## 2022-11-26 DIAGNOSIS — D509 Iron deficiency anemia, unspecified: Secondary | ICD-10-CM

## 2022-11-26 DIAGNOSIS — D696 Thrombocytopenia, unspecified: Secondary | ICD-10-CM | POA: Insufficient documentation

## 2022-11-26 DIAGNOSIS — Z7901 Long term (current) use of anticoagulants: Secondary | ICD-10-CM

## 2022-11-26 MED ORDER — FERROUS SULFATE 325 (65 FE) MG PO TABS
325.0000 mg | ORAL_TABLET | ORAL | 1 refills | Status: DC
Start: 2022-11-26 — End: 2023-02-07

## 2022-11-26 MED ORDER — APIXABAN 5 MG PO TABS
5.0000 mg | ORAL_TABLET | Freq: Two times a day (BID) | ORAL | 1 refills | Status: DC
Start: 2022-11-26 — End: 2022-12-14

## 2022-11-26 NOTE — Assessment & Plan Note (Addendum)
Patient presents after hospitalization for acute LLE DVT and bilateral PE. Briefly started on heparin IV but transitioned to Eliquis at time of discharge. She is adherent to Eliquis starter pack. Denies any further bleeding. Unprovoked. No recent surgeries, immobility or estrogen therapy. No hx of hypercoagulability for herself or family. No hx of miscarriages. She is current smoker but is trying to cut back. There is suspicion for malignancy. Completed pap smear with health dept recently pending results. Has not had screening mammography.   Plan -complete Eliquis starter pack, refill sent for Eliquis 5 mg BID  -await results for recent pap smear, also referral for OBGYN sent -screening mammogram placed

## 2022-11-26 NOTE — Assessment & Plan Note (Signed)
MRI done in hospital showed enlarged fibroid uterus. Reports heavy menstrual cycles. Pap smear was done recently at health department, patient does not have results and will try to obtain them. Referral for OBGYN placed today.

## 2022-11-26 NOTE — Assessment & Plan Note (Signed)
Chronic IDA. Iron panel low during hospitalization and received PRBC and IV iron. Started on po iron supplementation. Tolerating well and denies any constipation now. Plan to repeat iron panel at future OV.   Plan -continue po iron supplementation

## 2022-11-26 NOTE — Patient Instructions (Addendum)
Thank you, [name] for allowing [place] to provide your care today. Today we discussed your hospital follow up.  -Blood work today. I will call with results. -Refills placed today.  -Referral to OBGYN for uterine fibroids  -Referral for mammogram   I have ordered the following labs for you:  Lab Orders         CBC no Diff      Referrals ordered today:   Referral Orders         Ambulatory referral to Obstetrics / Gynecology      I have ordered the following medication/changed the following medications:   Stop the following medications: Medications Discontinued During This Encounter  Medication Reason   ferrous sulfate 325 (65 FE) MG tablet Reorder     Start the following medications: Meds ordered this encounter  Medications   apixaban (ELIQUIS) 5 MG TABS tablet    Sig: Take 1 tablet (5 mg total) by mouth 2 (two) times daily.    Dispense:  180 tablet    Refill:  1   ferrous sulfate 325 (65 FE) MG tablet    Sig: Take 1 tablet (325 mg total) by mouth every other day.    Dispense:  90 tablet    Refill:  1    Follow up: 3 months  Should you have any questions or concerns please call the internal medicine clinic at [phone].    Sonya Garner, D.O. [facility]

## 2022-11-26 NOTE — Assessment & Plan Note (Addendum)
Platelet count dropped at discharge to 114 from 191. No signs of active bleeding at this time. No rashes reported. Vitals stable today. Was briefly on heparin during hospital course. Will repeat CBC today to assess anemia and thrombocytopenia.

## 2022-11-26 NOTE — Progress Notes (Signed)
CC: establish care, hospital follow up  HPI:  [name] is a 42 y.o. female living with a history stated below and presents today for establishing care after recent hospitalization. Please see problem based assessment and plan for additional details.  PMH: -IDA  PSH: Past Surgical History:  Procedure Laterality Date   TUBAL LIGATION     Meds: Current Outpatient Medications on File Prior to Visit  Medication Sig Dispense Refill   APIXABAN (ELIQUIS) VTE STARTER PACK (10MG  AND 5MG ) Take as directed on package: start with two-5mg  tablets twice daily for 6 days starting on 11/22/2022. On day 7(11/28/2022), switch to one-5mg  tablet twice daily. 74 each 0   ferrous sulfate 325 (65 FE) MG tablet Take 1 tablet (325 mg total) by mouth 2 (two) times daily. 100 tablet 0   No current facility-administered medications on file prior to visit.   Allergies: No Known Allergies  Fhx: Mother- CVA Maternal grandmother- breast cancer Family History  Problem Relation Age of Onset   Cancer Maternal Aunt        breast   Cancer Maternal Uncle    Cancer Maternal Grandmother    Hearing loss Neg Hx    Shx: -smoke 2 packs a week x 20 years (states cutting back now), denies alcohol or illicit drug use -works as Engineer, water  -lives in [place] with 2 sons Social History   Socioeconomic History   Marital status: Single    Spouse name: Not on file   Number of children: Not on file   Years of education: Not on file   Highest education level: Not on file  Occupational History   Not on file  Tobacco Use   Smoking status: Every Day    Packs/day: 0.25    Years: 10.00    Additional pack years: 0.00    Total pack years: 2.50    Types: Cigarettes   Smokeless tobacco: Never   Tobacco comments:    Stopped 11/19/2022   Substance and Sexual Activity   Alcohol use: No   Drug use: No   Sexual activity: Yes    Birth control/protection: Surgical  Other Topics Concern   Not on file  Social  History Narrative   Not on file   Social Determinants of Health   Financial Resource Strain: Not on file  Food Insecurity: No Food Insecurity (11/26/2022)   Hunger Vital Sign    Worried About Running Out of Food in the Last Year: Never true    Ran Out of Food in the Last Year: Never true  Transportation Needs: No Transportation Needs (11/26/2022)   PRAPARE - Administrator, Civil Service (Medical): No    Lack of Transportation (Non-Medical): No  Physical Activity: Not on file  Stress: Not on file  Social Connections: Moderately Isolated (11/26/2022)   Social Connection and Isolation Panel [NHANES]    Frequency of Communication with Friends and Family: More than three times a week    Frequency of Social Gatherings with Friends and Family: More than three times a week    Attends Religious Services: Never    Database administrator or Organizations: No    Attends Banker Meetings: Never    Marital Status: Living with partner  Intimate Partner Violence: Not At Risk (11/26/2022)   Humiliation, Afraid, Rape, and Kick questionnaire    Fear of Current or Ex-Partner: No    Emotionally Abused: No    Physically Abused: No  Sexually Abused: No    Review of Systems: ROS negative except for what is noted on the assessment and plan.  Vitals:   11/26/22 0923  BP: 112/77  Pulse: 71  Resp: (!) 28  Temp: 98.2 F (36.8 C)  TempSrc: Oral  SpO2: 98%  Weight: 135 lb 1.6 oz (61.3 kg)  Height: 5\' 4"  (1.626 m)   Physical Exam: Constitutional: well-appearing female sitting in chair comfortably, in no acute distress HENT: normocephalic atraumatic Cardiovascular: regular rate and rhythm Pulmonary/Chest: normal work of breathing on room air, lungs clear to auscultation bilaterally MSK: normal bulk and tone, LLE mildly larger than right without tenderness, increased warmth or erythema Neurological: alert & oriented x 3 Skin: warm and dry Psych: pleasant mood  Assessment  & Plan:   Uterine fibroid MRI done in hospital showed enlarged fibroid uterus. Reports heavy menstrual cycles. Pap smear was done recently at health department, patient does not have results and will try to obtain them. Referral for OBGYN placed today.   Iron deficiency anemia Chronic IDA. Iron panel low during hospitalization and received PRBC and IV iron. Started on po iron supplementation. Tolerating well and denies any constipation now. Plan to repeat iron panel at future OV.   Plan -continue po iron supplementation  DVT, lower extremity, distal, acute, left [facility]) Patient presents after hospitalization for acute LLE DVT and bilateral PE. Briefly started on heparin IV but transitioned to Eliquis at time of discharge. She is adherent to Eliquis starter pack. Denies any further bleeding. Unprovoked. No recent surgeries, immobility or estrogen therapy. No hx of hypercoagulability for herself or family. No hx of miscarriages. She is current smoker but is trying to cut back. There is suspicion for malignancy. Completed pap smear with health dept recently pending results. Has not had screening mammography.   Plan -complete Eliquis starter pack, refill sent for Eliquis 5 mg BID  -await results for recent pap smear, also referral for OBGYN sent -screening mammogram placed   Thrombocytopenia (HCC) Platelet count dropped at discharge to 114 from 191. No signs of active bleeding at this time. No rashes reported. Vitals stable today. Was briefly on heparin during hospital course. Will repeat CBC today to assess anemia and thrombocytopenia.    Patient discussed with Dr. Rondall Allegra, D.O. [facility] Internal Medicine, PGY-1 Phone: [phone] Date 11/26/2022 Time 9:33 AM

## 2022-11-27 ENCOUNTER — Telehealth: Payer: Self-pay

## 2022-11-27 ENCOUNTER — Encounter: Payer: Self-pay | Admitting: Student

## 2022-11-27 LAB — CBC
Hematocrit: 29 % — ABNORMAL LOW (ref 34.0–46.6)
Hemoglobin: 8.6 g/dL — ABNORMAL LOW (ref 11.1–15.9)
MCH: 21 pg — ABNORMAL LOW (ref 26.6–33.0)
MCHC: 29.7 g/dL — ABNORMAL LOW (ref 31.5–35.7)
MCV: 71 fL — ABNORMAL LOW (ref 79–97)
Platelets: 122 10*3/uL — ABNORMAL LOW (ref 150–450)
RBC: 4.1 x10E6/uL (ref 3.77–5.28)
WBC: 11.1 10*3/uL — ABNORMAL HIGH (ref 3.4–10.8)

## 2022-11-27 NOTE — Telephone Encounter (Signed)
Telephoned patient at mobile number. Left a voice message with [facility] contact information. 

## 2022-11-29 ENCOUNTER — Other Ambulatory Visit (HOSPITAL_COMMUNITY): Payer: Self-pay

## 2022-11-29 NOTE — Progress Notes (Signed)
Internal Medicine Clinic Attending  Case discussed with Dr. Sherrilee Gilles  At the time of the visit.  We reviewed the resident's history and exam and pertinent patient test results.  I agree with the assessment, diagnosis, and plan of care documented in the resident's note.  Young woman with unprovoked DVT. Agree with cancer screening.  Recommend anticoagulation for a year then referral to hematology for hypercoagulability evaluation.

## 2022-11-30 ENCOUNTER — Telehealth (HOSPITAL_COMMUNITY): Payer: Self-pay

## 2022-11-30 ENCOUNTER — Other Ambulatory Visit (HOSPITAL_COMMUNITY): Payer: Self-pay

## 2022-11-30 NOTE — Telephone Encounter (Signed)
Patient Advocate Encounter  Prior Authorization for Eliquis DVT/PE Starter Pack 5MG  tablets has been approved through [employer].  KEY: [id number]    Effective: 11-30-2022 to 11-30-2023  Test claim shows co-pay of $175.00 for 30 day supply.    Prior Authorization for Eliquis 5MG  tablets has been approved through [employer].  KEY: [id number]    Effective: 11-30-2022 to 11-30-2023  Test claim shows co-pay of $175.00 for 30 or 90 day supply

## 2022-12-12 ENCOUNTER — Telehealth: Payer: Self-pay

## 2022-12-12 NOTE — Telephone Encounter (Signed)
Called pt-no answer.Disability and FMLA paperwork has been completed and ready for pick up. Paperwork will be left inside the Cabin at the front desk.

## 2022-12-13 ENCOUNTER — Encounter: Payer: Self-pay | Admitting: Obstetrics and Gynecology

## 2022-12-14 ENCOUNTER — Other Ambulatory Visit: Payer: Self-pay

## 2022-12-14 DIAGNOSIS — I824Z2 Acute embolism and thrombosis of unspecified deep veins of left distal lower extremity: Secondary | ICD-10-CM

## 2022-12-14 MED ORDER — APIXABAN 5 MG PO TABS
5.0000 mg | ORAL_TABLET | Freq: Two times a day (BID) | ORAL | 3 refills | Status: DC
Start: 2022-12-14 — End: 2023-04-01

## 2023-01-08 ENCOUNTER — Ambulatory Visit
Admission: RE | Admit: 2023-01-08 | Discharge: 2023-01-08 | Disposition: A | Discharge status: Home / Self Care | Payer: Managed Care, Other (non HMO) | Source: Ambulatory Visit | Attending: Internal Medicine | Admitting: Internal Medicine

## 2023-01-08 DIAGNOSIS — Z1231 Encounter for screening mammogram for malignant neoplasm of breast: Secondary | ICD-10-CM

## 2023-02-06 ENCOUNTER — Emergency Department (HOSPITAL_COMMUNITY): Payer: Managed Care, Other (non HMO)

## 2023-02-06 ENCOUNTER — Observation Stay (HOSPITAL_COMMUNITY): Payer: Managed Care, Other (non HMO)

## 2023-02-06 ENCOUNTER — Other Ambulatory Visit: Payer: Self-pay

## 2023-02-06 ENCOUNTER — Observation Stay (HOSPITAL_COMMUNITY)
Admission: EM | Admit: 2023-02-06 | Discharge: 2023-02-07 | Disposition: A | Discharge status: Home / Self Care | Payer: Managed Care, Other (non HMO) | Attending: Internal Medicine | Admitting: Internal Medicine

## 2023-02-06 ENCOUNTER — Encounter (HOSPITAL_COMMUNITY): Payer: Self-pay | Admitting: Internal Medicine

## 2023-02-06 ENCOUNTER — Other Ambulatory Visit: Payer: Self-pay | Admitting: Obstetrics & Gynecology

## 2023-02-06 DIAGNOSIS — Z86711 Personal history of pulmonary embolism: Secondary | ICD-10-CM | POA: Diagnosis not present

## 2023-02-06 DIAGNOSIS — Z79899 Other long term (current) drug therapy: Secondary | ICD-10-CM | POA: Diagnosis not present

## 2023-02-06 DIAGNOSIS — N189 Chronic kidney disease, unspecified: Secondary | ICD-10-CM | POA: Insufficient documentation

## 2023-02-06 DIAGNOSIS — I2699 Other pulmonary embolism without acute cor pulmonale: Secondary | ICD-10-CM | POA: Diagnosis present

## 2023-02-06 DIAGNOSIS — D259 Leiomyoma of uterus, unspecified: Secondary | ICD-10-CM | POA: Diagnosis present

## 2023-02-06 DIAGNOSIS — I824Z2 Acute embolism and thrombosis of unspecified deep veins of left distal lower extremity: Secondary | ICD-10-CM | POA: Diagnosis not present

## 2023-02-06 DIAGNOSIS — N921 Excessive and frequent menstruation with irregular cycle: Secondary | ICD-10-CM | POA: Diagnosis not present

## 2023-02-06 DIAGNOSIS — Z7901 Long term (current) use of anticoagulants: Secondary | ICD-10-CM | POA: Insufficient documentation

## 2023-02-06 DIAGNOSIS — D509 Iron deficiency anemia, unspecified: Principal | ICD-10-CM | POA: Diagnosis present

## 2023-02-06 DIAGNOSIS — N939 Abnormal uterine and vaginal bleeding, unspecified: Secondary | ICD-10-CM | POA: Diagnosis present

## 2023-02-06 DIAGNOSIS — D649 Anemia, unspecified: Secondary | ICD-10-CM | POA: Diagnosis present

## 2023-02-06 DIAGNOSIS — Z86718 Personal history of other venous thrombosis and embolism: Secondary | ICD-10-CM | POA: Diagnosis not present

## 2023-02-06 DIAGNOSIS — Z87891 Personal history of nicotine dependence: Secondary | ICD-10-CM | POA: Insufficient documentation

## 2023-02-06 DIAGNOSIS — D500 Iron deficiency anemia secondary to blood loss (chronic): Secondary | ICD-10-CM

## 2023-02-06 DIAGNOSIS — R072 Precordial pain: Secondary | ICD-10-CM | POA: Diagnosis present

## 2023-02-06 LAB — BASIC METABOLIC PANEL
Anion gap: 13 (ref 5–15)
BUN: 10 mg/dL (ref 6–20)
CO2: 20 mmol/L — ABNORMAL LOW (ref 22–32)
Calcium: 9 mg/dL (ref 8.9–10.3)
Chloride: 108 mmol/L (ref 98–111)
Creatinine, Ser: 0.75 mg/dL (ref 0.44–1.00)
GFR, Estimated: >60 mL/min (ref >60)
Glucose, Bld: 107 mg/dL — ABNORMAL HIGH (ref 70–99)
Potassium: 3.6 mmol/L (ref 3.5–5.1)
Sodium: 141 mmol/L (ref 135–145)

## 2023-02-06 LAB — CBC
HCT: 21.2 % — ABNORMAL LOW (ref 36.0–46.0)
Hemoglobin: 6.3 g/dL — CL (ref 12.0–15.0)
MCH: 21.8 pg — ABNORMAL LOW (ref 26.0–34.0)
MCHC: 29.7 g/dL — ABNORMAL LOW (ref 30.0–36.0)
MCV: 73.4 fL — ABNORMAL LOW (ref 80.0–100.0)
Platelets: 373 10*3/uL (ref 150–400)
RBC: 2.89 MIL/uL — ABNORMAL LOW (ref 3.87–5.11)
RDW: 23.6 % — ABNORMAL HIGH (ref 11.5–15.5)
WBC: 3.3 10*3/uL — ABNORMAL LOW (ref 4.0–10.5)
nRBC: 1.5 % — ABNORMAL HIGH (ref 0.0–0.2)

## 2023-02-06 LAB — TYPE AND SCREEN

## 2023-02-06 LAB — BPAM RBC
Blood Product Expiration Date: 202408262359
ISSUE DATE / TIME: 202407241005

## 2023-02-06 LAB — LIPID PANEL
Cholesterol: 159 mg/dL (ref 0–200)
HDL: 52 mg/dL (ref >40)
LDL Cholesterol: 95 mg/dL (ref 0–99)
Total CHOL/HDL Ratio: 3.1 RATIO
Triglycerides: 58 mg/dL (ref <150)
VLDL: 12 mg/dL (ref 0–40)

## 2023-02-06 LAB — HCG, SERUM, QUALITATIVE: Preg, Serum: NEGATIVE

## 2023-02-06 LAB — TROPONIN I (HIGH SENSITIVITY): Troponin I (High Sensitivity): <2 ng/L (ref <18)

## 2023-02-06 LAB — PREPARE RBC (CROSSMATCH)

## 2023-02-06 LAB — HEMOGLOBIN AND HEMATOCRIT, BLOOD
HCT: 23.8 % — ABNORMAL LOW (ref 36.0–46.0)
Hemoglobin: 7.4 g/dL — ABNORMAL LOW (ref 12.0–15.0)

## 2023-02-06 MED ORDER — LEUPROLIDE ACETATE 7.5 MG IM KIT
7.5000 mg | PACK | Freq: Once | INTRAMUSCULAR | Status: AC
  Administered 2023-02-06: 7.5 mg via INTRAMUSCULAR
  Filled 2023-02-06: qty 7.5

## 2023-02-06 MED ORDER — ESTROGENS CONJUGATED 25 MG IJ SOLR
25.0000 mg | Freq: Once | INTRAMUSCULAR | Status: AC
  Administered 2023-02-06: 25 mg via INTRAVENOUS
  Filled 2023-02-06: qty 25

## 2023-02-06 MED ORDER — LEUPROLIDE ACETATE (3 MONTH) 11.25 MG IM KIT
11.2500 mg | PACK | Freq: Once | INTRAMUSCULAR | Status: DC
  Filled 2023-02-06: qty 11.25

## 2023-02-06 MED ORDER — MEGESTROL ACETATE 40 MG PO TABS
120.0000 mg | ORAL_TABLET | Freq: Every day | ORAL | Status: DC
  Administered 2023-02-06 – 2023-02-07 (×2): 120 mg via ORAL
  Filled 2023-02-06 (×2): qty 3

## 2023-02-06 MED ORDER — SENNOSIDES-DOCUSATE SODIUM 8.6-50 MG PO TABS: 1.0000 | ORAL_TABLET | Freq: Every evening | ORAL | Status: DC | PRN

## 2023-02-06 MED ORDER — LEUPROLIDE ACETATE 3.75 MG IM KIT
3.7500 mg | PACK | Freq: Once | INTRAMUSCULAR | Status: AC
  Administered 2023-02-06: 3.75 mg via INTRAMUSCULAR
  Filled 2023-02-06: qty 3.75

## 2023-02-06 MED ORDER — APIXABAN 5 MG PO TABS
5.0000 mg | ORAL_TABLET | Freq: Two times a day (BID) | ORAL | Status: DC
  Filled 2023-02-06: qty 1

## 2023-02-06 MED ORDER — LEUPROLIDE ACETATE 7.5 MG IM KIT
7.5000 mg | PACK | Freq: Once | INTRAMUSCULAR | Status: DC
  Filled 2023-02-06: qty 7.5

## 2023-02-06 MED ORDER — ACETAMINOPHEN 650 MG RE SUPP: 650.0000 mg | Freq: Four times a day (QID) | RECTAL | Status: DC | PRN

## 2023-02-06 MED ORDER — FERROUS SULFATE 325 (65 FE) MG PO TABS
325.0000 mg | ORAL_TABLET | ORAL | Status: DC
  Administered 2023-02-06: 325 mg via ORAL
  Filled 2023-02-06 (×2): qty 1

## 2023-02-06 MED ORDER — SODIUM CHLORIDE 0.9% IV SOLUTION: Freq: Once | INTRAVENOUS | Status: DC

## 2023-02-06 MED ORDER — MEGESTROL ACETATE 40 MG PO TABS: 120.0000 mg | ORAL_TABLET | Freq: Every day | ORAL | 0 refills | Status: DC

## 2023-02-06 MED ORDER — ACETAMINOPHEN 325 MG PO TABS: 650.0000 mg | ORAL_TABLET | Freq: Four times a day (QID) | ORAL | Status: DC | PRN

## 2023-02-06 MED ORDER — APIXABAN 5 MG PO TABS
5.0000 mg | ORAL_TABLET | Freq: Two times a day (BID) | ORAL | Status: DC
  Administered 2023-02-06 – 2023-02-07 (×3): 5 mg via ORAL
  Filled 2023-02-06 (×3): qty 1

## 2023-02-06 NOTE — ED Notes (Signed)
ED TO INPATIENT HANDOFF REPORT  ED Nurse Name and Phone #: Theophilus Bones [phone]  S Name/Age/Gender Sonya Garner 42 y.o. female Room/Bed: 029C/029C  Code Status   Code Status: Full Code  Home/SNF/Other Home Patient oriented to: self, place, time, and situation Is this baseline? Yes   Triage Complete: Triage complete  Chief Complaint Symptomatic anemia [D64.9]  Triage Note Patient reports intermittent substernal chest pain since Sunday last week with mild SOB/dizziness .    Allergies No Known Allergies  Level of Care/Admitting Diagnosis ED Disposition     ED Disposition  Admit   Condition  --   Comment  Hospital Area: [facility] [[id number]]  Level of Care: Med-Surg [16]  May place patient in observation at [facility] or [facility] if equivalent level of care is available:: No  Covid Evaluation: Asymptomatic - no recent exposure (last 10 days) testing not required  Diagnosis: Symptomatic anemia [1478295]  Admitting Physician: Dickie La [[id number]]  Attending Physician: Dickie La [[id number]]          B Medical/Surgery History Past Medical History:  Diagnosis Date   Chronic kidney disease    kidney stone   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   Qualifier: Diagnosis of   By: Levada Schilling       Mild concussion 12/05/2016   Past Surgical History:  Procedure Laterality Date   TUBAL LIGATION       A IV Location/Drains/Wounds Patient Lines/Drains/Airways Status     Active Line/Drains/Airways     Name Placement date Placement time Site Days   Peripheral IV 02/06/23 18 G Left Antecubital 02/06/23  0703  Antecubital  less than 1            Intake/Output Last 24 hours No intake or output data in the 24 hours ending 02/06/23 0958  Labs/Imaging Results for orders placed or performed during the hospital encounter of 02/06/23 (from the past 48 hour(s))  Basic metabolic panel     Status: Abnormal   Collection Time: 02/06/23  6:29 AM  Result  Value Ref Range   Sodium 141 135 - 145 mmol/L   Potassium 3.6 3.5 - 5.1 mmol/L   Chloride 108 98 - 111 mmol/L   CO2 20 (L) 22 - 32 mmol/L   Glucose, Bld 107 (H) 70 - 99 mg/dL    Comment: Glucose reference range applies only to samples taken after fasting for at least 8 hours.   BUN 10 6 - 20 mg/dL   Creatinine, Ser 2.95 0.44 - 1.00 mg/dL   Calcium 9.0 8.9 - 28.4 mg/dL   GFR, Estimated >13 >24 mL/min    Comment: (NOTE) Calculated using the CKD-EPI Creatinine Equation (2021)    Anion gap 13 5 - 15    Comment: Performed at [facility], [address] [place], [place] [address]  CBC     Status: Abnormal   Collection Time: 02/06/23  6:29 AM  Result Value Ref Range   WBC 3.3 (L) 4.0 - 10.5 K/uL   RBC 2.89 (L) 3.87 - 5.11 MIL/uL   Hemoglobin 6.3 (LL) 12.0 - 15.0 g/dL    Comment: REPEATED TO VERIFY Reticulocyte Hemoglobin testing may be clinically indicated, consider ordering this additional test [id number] THIS CRITICAL RESULT HAS VERIFIED AND BEEN CALLED TO FERNANDA FRITA RN BY SHERRY GALLOWAY ON 07 24 2024 AT 0656, AND HAS BEEN READ BACK.     HCT 21.2 (L) 36.0 - 46.0 %   MCV 73.4 (L) 80.0 - 100.0  fL   MCH 21.8 (L) 26.0 - 34.0 pg   MCHC 29.7 (L) 30.0 - 36.0 g/dL   RDW 28.4 (H) 13.2 - 44.0 %   Platelets 373 150 - 400 K/uL   nRBC 1.5 (H) 0.0 - 0.2 %    Comment: Performed at [facility], [address], [place], [place] [address]  Troponin I (High Sensitivity)     Status: None   Collection Time: 02/06/23  6:29 AM  Result Value Ref Range   Troponin I (High Sensitivity) <2 <18 ng/L    Comment: (NOTE) Elevated high sensitivity troponin I (hsTnI) values and significant  changes across serial measurements may suggest ACS but many other  chronic and acute conditions are known to elevate hsTnI results.  Refer to the "Links" section for chest pain algorithms and additional  guidance. Performed at [facility], [address], [place], [place] [address]   hCG,  serum, qualitative     Status: None   Collection Time: 02/06/23  6:29 AM  Result Value Ref Range   Preg, Serum NEGATIVE NEGATIVE    Comment:        THE SENSITIVITY OF THIS METHODOLOGY IS >10 mIU/mL. Performed at [facility], [address] [place], [place] [address]   Type and screen [facility]     Status: None (Preliminary result)   Collection Time: 02/06/23  7:40 AM  Result Value Ref Range   ABO/RH(D) O POS    Antibody Screen NEG    Sample Expiration      02/09/2023,2359 Performed at [facility], [address] [place], [place] [address]    Unit Number [id number]    Blood Component Type RED CELLS,LR    Unit division 00    Status of Unit ALLOCATED    Transfusion Status OK TO TRANSFUSE    Crossmatch Result Compatible   Prepare RBC (crossmatch)     Status: None   Collection Time: 02/06/23  7:40 AM  Result Value Ref Range   Order Confirmation      ORDER PROCESSED BY BLOOD BANK Performed at [facility], [address], [place], [place] [address]    DG Chest 2 View  Result Date: 02/06/2023 CLINICAL DATA:  42 year old female with history of intermittent substernal chest pain. EXAM: CHEST - 2 VIEW COMPARISON:  Chest x-ray 11/20/2022. FINDINGS: Lung volumes are normal. No consolidative airspace disease. No pleural effusions. No pneumothorax. No pulmonary nodule or mass noted. Pulmonary vasculature and the cardiomediastinal silhouette are within normal limits. IMPRESSION: No radiographic evidence of acute cardiopulmonary disease. Electronically Signed   By: Trudie Reed M.D.   On: 02/06/2023 06:45    Pending Labs Unresulted Labs (From admission, onward)     Start     Ordered   02/07/23 0500  CBC  Tomorrow morning,   R        02/06/23 0948            Vitals/Pain Today's Vitals   02/06/23 0900 02/06/23 0915 02/06/23 0930 02/06/23 0945  BP: 112/70 105/72 103/80 116/78  Pulse:      Resp: (!) 22 20 16 20   Temp:      SpO2:       PainSc:        Isolation Precautions No active isolations  Medications Medications  0.9 %  sodium chloride infusion (Manually program via Guardrails IV Fluids) (has no administration in time range)  megestrol (MEGACE) tablet  120 mg (has no administration in time range)  conjugated estrogens (PREMARIN) injection 25 mg (has no administration in time range)  leuprolide (LUPRON) injection 7.5 mg (has no administration in time range)  apixaban (ELIQUIS) tablet 5 mg (has no administration in time range)  ferrous sulfate tablet 325 mg (has no administration in time range)  acetaminophen (TYLENOL) tablet 650 mg (has no administration in time range)    Or  acetaminophen (TYLENOL) suppository 650 mg (has no administration in time range)  senna-docusate (Senokot-S) tablet 1 tablet (has no administration in time range)    Mobility walks     Focused Assessments Cardiac Assessment Handoff:  Cardiac Rhythm: Normal sinus rhythm No results found for: "CKTOTAL", "CKMB", "CKMBINDEX", "TROPONINI" No results found for: "DDIMER" Does the Patient currently have chest pain? Yes    R Recommendations: See Admitting Provider Note  Report given to:   Additional Notes:

## 2023-02-06 NOTE — H&P (Signed)
Date: 02/06/2023               Patient Name:  Sonya Garner MRN: [id number]  DOB: 1981-02-24 Age / Sex: 42 y.o., female   PCP: No primary care provider on file.         Medical Service: Internal Medicine Teaching Service         Attending Physician: Dr. Dickie La, MD      First Contact: Annett Fabian, MD        Pager: Lurlean Nanny [phone]        Second Contact: Elza Rafter, DO    Pager: [phone]    Chief Complaint: Chest pain/shortness of breath/fatigue  History of Present Illness:  Pt is a 42 year old woman with hx of uterine fibroids, bilateral PE on [facility] and tobacco use disorder presenting to the ED for complaint of chest pain, shortness of breath and fatigue. She has heavy menstrual bleeding and has required a transfusion of 2 units pRBC in May. She still endorses this bleeding. Workup showed pt had enlarged fibroid uterus. She was recommended to follow up in OB/Gyn in May but has not been able to get an appointment. Chart Review shows she does have an appointment upcoming in 02/26/23.   She states CP and feeling short of breath has been intermittent since her PE diagnosis but does report slight worsening since Sunday. States it occurs with exertion or movement of torso. She reports headaches and blurry vision starting yesterday. No GI discomfort or GI symptoms. States she gets cramping pain in her pelvis that is intermittent.    Review of Systems negative unless stated in the HPI.  In the ED, lab work was obtained that was notable for hgb of 6.3. 1 unit was ordered. Other work up for chest pain/SOB was negative with normal EKG, troponins, and normal CXR all reviewed by me. Troponin was <2, EKG showed sinus tachycardia with non-specific ST changes, and CXR was clear without any acute findings.   Past Medical History: Past Medical History:  Diagnosis Date   Chronic kidney disease    kidney stone   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   Qualifier: Diagnosis of   By:  Levada Schilling       Mild concussion 12/05/2016  Pulmonary Embolism 11/2022 DVT 11/2022  Meds: Current Outpatient Medications  Medication Instructions   apixaban (ELIQUIS) 5 mg, Oral, 2 times daily   ferrous sulfate 325 mg, Oral, Every other day    Allergies: NKDA Allergies as of 02/06/2023   (No Known Allergies)    Past Surgical History: Past Surgical History:  Procedure Laterality Date   TUBAL LIGATION      Family History:  Family History  Problem Relation Age of Onset   Breast cancer Maternal Aunt    Cancer Maternal Aunt        breast   Cancer Maternal Uncle    Breast cancer Maternal Grandmother    Cancer Maternal Grandmother    Hearing loss Neg Hx     Social History:  Lives with spouse and children Currently works in a corporate office.  States she has stopped smoking cigarettes since last hospitalization. She vapes occasionally.  EtOH-Denies use.  Illicit drug use- denies use.  IADLs/ADLs- can person independently at baseline  Hobbies: likes to go shopping with family.    Physical Exam: Blood pressure 111/69, pulse 80, temperature 97.9 F (36.6 C), resp. rate (!) 24, SpO2 100%. General: NAD, pleasant HENT:NCAT Lungs: CTAB Cardiovascular: NSR,  good pulses, no LE edema Abdomen: No TTP, normal bowel sounds MSK: No asymmetry,  Skin: no lesion on exposed skin Neuro: alert and oriented x4 Psych: normal mood and normal affect  Diagnostics:     Latest Ref Rng & Units 02/06/2023    6:29 AM 11/26/2022   10:31 AM 11/21/2022    9:48 AM  CBC  WBC 4.0 - 10.5 K/uL 3.3  11.1  8.8   Hemoglobin 12.0 - 15.0 g/dL 6.3  8.6  7.9   Hematocrit 36.0 - 46.0 % 21.2  29.0  26.6   Platelets 150 - 400 K/uL 373  122  114        Latest Ref Rng & Units 02/06/2023    6:29 AM 11/20/2022   10:45 AM 11/20/2022    8:20 AM  CMP  Glucose 70 - 99 mg/dL 295   621   BUN 6 - 20 mg/dL 10   9   Creatinine 3.08 - 1.00 mg/dL 6.57   8.46   Sodium 962 - 145 mmol/L 141   133   Potassium 3.5 -  5.1 mmol/L 3.6   3.8   Chloride 98 - 111 mmol/L 108   101   CO2 22 - 32 mmol/L 20   22   Calcium 8.9 - 10.3 mg/dL 9.0   8.9   Total Protein 6.5 - 8.1 g/dL  6.5    Total Bilirubin 0.3 - 1.2 mg/dL  0.4    Alkaline Phos 38 - 126 U/L  41    AST 15 - 41 U/L  20    ALT 0 - 44 U/L  25      DG Chest 2 View  Result Date: 02/06/2023 CLINICAL DATA:  42 year old female with history of intermittent substernal chest pain. EXAM: CHEST - 2 VIEW COMPARISON:  Chest x-ray 11/20/2022.  IMPRESSION: No radiographic evidence of acute cardiopulmonary disease. Electronically Signed   By: Trudie Reed M.D.   On: 02/06/2023 06:45    Assessment & Plan by Problem:  Present on Admission:  Symptomatic anemia  Symptomatic Anemia:  Pt noted to have hgb 6.3. She has known vaginal bleeding that has persisted for >2 months. Reports no other to symptoms to suspect GI or GU source. Reports changing >6 pad per day since onset. Was given 2 units on last admission and now will transfuse 1 unit. She had a follow up with OB/Gyn next month will consult to work up further as pt has known fibroids on previous imaging and MR of lower back noted enlarged fibroid uterus. Order for repeat pelvis ultrasound are in. OB/Gyn have been consulted; appreciate their recommendation. Pt needs to remain on [facility] for her PE so will await their recommendations regarding best management for her vaginal bleeding. Pt states she had tubal ligation performed and would be fine with hysterectomy if needed. Suspect her symptoms of headache and blurry vision will subside with transfusion.   Chronic Problems: PE: Continue Eliquis while inpatient. Pt will need mammogram OP.   DVT prophx: Eliquis Diet: Regular Bowel: PRN Code: Full  Prior to Admission Living Arrangement: Home Anticipated Discharge Location: Home Barriers to Discharge: Medical Workup  Dispo: Admit patient to Observation with expected length of stay less than 2 midnights.  Gwenevere Abbot,  MD Eligha Bridegroom. [facility] Internal Medicine Residency, PGY-3 Pager: [phone] After 5 pm or weekends:  1st Contact: Pager: [phone]  2nd Contact: Pager: [phone]

## 2023-02-06 NOTE — ED Triage Notes (Signed)
Patient reports intermittent substernal chest pain since Sunday last week with mild SOB/dizziness .

## 2023-02-06 NOTE — Plan of Care (Signed)

## 2023-02-06 NOTE — Progress Notes (Signed)
MEDICATION RELATED CONSULT NOTE - INITIAL   Pharmacy Consult for patient education regarding leuprolide/Lupron intramuscular injection Indication: treat of anemia caused by fibroids of the uterus currently causing bleeding.   No Known Allergies  Vital Signs: Temp: 98.2 F (36.8 C) (07/24 1338) Temp Source: Oral (07/24 1338) BP: 113/62 (07/24 1338) Pulse Rate: 94 (07/24 1338)  Labs: Recent Labs    02/06/23 0629  WBC 3.3*  HGB 6.3*  HCT 21.2*  PLT 373  CREATININE 0.75   Estimated Creatinine Clearance: 80.7 mL/min (by C-G formula based on SCr of 0.75 mg/dL).    Medical History: Past Medical History:  Diagnosis Date   Chronic kidney disease    kidney stone   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   Qualifier: Diagnosis of   By: Levada Schilling       Mild concussion 12/05/2016    Medications:  Leuprolide/Lupron injection  Plan:  Patient education materials from [employer] were provided to the patient and she was given an opportunity to ask, and have answered any questions regarding the drug therapy.  Elicia Lamp, PharmD, CPP 02/06/2023,2:14 PM

## 2023-02-06 NOTE — ED Notes (Signed)
1 of 1 unit RBC infusing to LAC via 20g IV without difficulty. Pt remains upright in bed speaking on cellular device, A&Ox4 at this time.

## 2023-02-06 NOTE — Progress Notes (Signed)
Patient ID: Sonya Garner, female   DOB: 08-27-1980, 42 y.o.   MRN: [id number] .Reason for Consult:heavy prolonged menstrual bleeding Referring Physician: ED/Hospitalist  Sonya Garner is an 42 y.o. female. [id number] s/p tubal ligation who normally has 5-7 day periods.  First 5 days normal, a bit heavy,  and then taper However in April she began having daily vaginal bleeding and presented to ED 12/21/22 with weakness and left leg pain She was found to DVT and have a hemoglobin 5.2 She reports a [facility] She was begun on eliquis and has continued to bleed since then She has not been managed with any medication She has an appointment with Dr Alysia Penna, GYN, 02/26/23 I do not see any scans ordered back in May There is this mention of fibroids but she has not had a scan since 2015 which was normal  I have given her premarin 89m IV today along with megestrol 120 mg and she is about to get lupron 11.25 mg IM She has received 1 unit of blood thus far Sonogram is ordered and pending       Past Medical History:  Diagnosis Date   Chronic kidney disease    kidney stone   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   Qualifier: Diagnosis of   By: Levada Schilling       Mild concussion 12/05/2016    Past Surgical History:  Procedure Laterality Date   TUBAL LIGATION      Family History  Problem Relation Age of Onset   Breast cancer Maternal Aunt    Cancer Maternal Aunt        breast   Cancer Maternal Uncle    Breast cancer Maternal Grandmother    Cancer Maternal Grandmother    Hearing loss Neg Hx     Social History:  reports that she has been smoking cigarettes. She has a 2.5 pack-year smoking history. She has never used smokeless tobacco. She reports that she does not drink alcohol and does not use drugs.  Allergies: No Known Allergies  Medications: I have reviewed the patient's current medications.  Review of Systems  Blood pressure 113/62, pulse 94,  temperature 98.2 F (36.8 C), temperature source Oral, resp. rate 20, height 5\' 4"  (1.626 m), weight 61.3 kg, SpO2 96%. Physical Exam Abdomen is soft non tender no masses palpable pelvic deferred pending sonogram  Results for orders placed or performed during the hospital encounter of 02/06/23 (from the past 48 hour(s))  Basic metabolic panel     Status: Abnormal   Collection Time: 02/06/23  6:29 AM  Result Value Ref Range   Sodium 141 135 - 145 mmol/L   Potassium 3.6 3.5 - 5.1 mmol/L   Chloride 108 98 - 111 mmol/L   CO2 20 (L) 22 - 32 mmol/L   Glucose, Bld 107 (H) 70 - 99 mg/dL    Comment: Glucose reference range applies only to samples taken after fasting for at least 8 hours.   BUN 10 6 - 20 mg/dL   Creatinine, Ser 1.47 0.44 - 1.00 mg/dL   Calcium 9.0 8.9 - 82.9 mg/dL   GFR, Estimated >56 >21 mL/min    Comment: (NOTE) Calculated using the CKD-EPI Creatinine Equation (2021)    Anion gap 13 5 - 15    Comment: Performed at [facility], [address], [place], [place] [address]  CBC     Status: Abnormal   Collection Time: 02/06/23  6:29 AM  Result Value Ref Range   WBC 3.3 (L) 4.0 - 10.5 K/uL   RBC 2.89 (L) 3.87 - 5.11 MIL/uL   Hemoglobin 6.3 (LL) 12.0 - 15.0 g/dL    Comment: REPEATED TO VERIFY Reticulocyte Hemoglobin testing may be clinically indicated, consider ordering this additional test [id number] THIS CRITICAL RESULT HAS VERIFIED AND BEEN CALLED TO FERNANDA FRITA RN BY SHERRY GALLOWAY ON 07 24 2024 AT 0656, AND HAS BEEN READ BACK.     HCT 21.2 (L) 36.0 - 46.0 %   MCV 73.4 (L) 80.0 - 100.0 fL   MCH 21.8 (L) 26.0 - 34.0 pg   MCHC 29.7 (L) 30.0 - 36.0 g/dL   RDW 47.8 (H) 29.5 - 62.1 %   Platelets 373 150 - 400 K/uL   nRBC 1.5 (H) 0.0 - 0.2 %    Comment: Performed at [facility], [address], [place], [place] [address]  Troponin I (High Sensitivity)     Status: None   Collection Time: 02/06/23  6:29 AM  Result Value Ref Range   Troponin I (High  Sensitivity) <2 <18 ng/L    Comment: (NOTE) Elevated high sensitivity troponin I (hsTnI) values and significant  changes across serial measurements may suggest ACS but many other  chronic and acute conditions are known to elevate hsTnI results.  Refer to the "Links" section for chest pain algorithms and additional  guidance. Performed at [facility], [address], [place], [place] [address]   hCG, serum, qualitative     Status: None   Collection Time: 02/06/23  6:29 AM  Result Value Ref Range   Preg, Serum NEGATIVE NEGATIVE    Comment:        THE SENSITIVITY OF THIS METHODOLOGY IS >10 mIU/mL. Performed at [facility], [address], [place], [place] [address]   Lipid panel     Status: None   Collection Time: 02/06/23  6:39 AM  Result Value Ref Range   Cholesterol 159 0 - 200 mg/dL   Triglycerides 58 <841 mg/dL   HDL 52 >32 mg/dL   Total CHOL/HDL Ratio 3.1 RATIO   VLDL 12 0 - 40 mg/dL   LDL Cholesterol 95 0 - 99 mg/dL    Comment:        Total Cholesterol/HDL:CHD Risk Coronary Heart Disease Risk Table                     Men   Women  1/2 Average Risk   3.4   3.3  Average Risk       5.0   4.4  2 X Average Risk   9.6   7.1  3 X Average Risk  23.4   11.0        Use the calculated Patient Ratio above and the CHD Risk Table to determine the patient's CHD Risk.        ATP III CLASSIFICATION (LDL):  <100     mg/dL   Optimal  440-102  mg/dL   Near or Above                    Optimal  130-159  mg/dL   Borderline  725-366  mg/dL   High  >440     mg/dL   Very High Performed at [facility], [address] [place], [place] [address]   Type and screen [facility]     Status: None (Preliminary result)  Collection Time: 02/06/23  7:40 AM  Result Value Ref Range   ABO/RH(D) O POS    Antibody Screen NEG    Sample Expiration 02/09/2023,2359    Unit Number [id number]    Blood Component Type RED CELLS,LR    Unit division 00    Status  of Unit ISSUED    Transfusion Status OK TO TRANSFUSE    Crossmatch Result      Compatible Performed at [facility], [address] [place], [place] [address]   Prepare RBC (crossmatch)     Status: None   Collection Time: 02/06/23  7:40 AM  Result Value Ref Range   Order Confirmation      ORDER PROCESSED BY BLOOD BANK Performed at [facility], [address], [place], [place] [address]     DG Chest 2 View  Result Date: 02/06/2023 CLINICAL DATA:  42 year old female with history of intermittent substernal chest pain. EXAM: CHEST - 2 VIEW COMPARISON:  Chest x-ray 11/20/2022. FINDINGS: Lung volumes are normal. No consolidative airspace disease. No pleural effusions. No pneumothorax. No pulmonary nodule or mass noted. Pulmonary vasculature and the cardiomediastinal silhouette are within normal limits. IMPRESSION: No radiographic evidence of acute cardiopulmonary disease. Electronically Signed   By: Trudie Reed M.D.   On: 02/06/2023 06:45    Assessment/Plan: Heavy prolonged menstrual bleeding on eliquis for DVT  I have given her premarin 51m IV today along with megestrol 120 mg and she is about to get lupron 11.25 mg IM She has received 1 unit of blood thus far Sonogram is ordered and pending She is scheduled to see Dr Alysia Penna in about 3 weeks, 02/26/23  After patient gets her sonogram and is deemed hemodynamically stable she can be discharged to follow up as scheduled I sent Rx for 7 days megestrol 120 mg to her pharmacy, by then the Lupron 11.25 will have come on board which buys [place] 3 months for definitive management based on sonogram and discussion with patient   Lazaro Arms 02/06/2023

## 2023-02-06 NOTE — ED Provider Notes (Signed)
[facility] AT [facility] Provider Note   CSN: [id number] Arrival date & time: 02/06/23  8841     History  Chief Complaint  Patient presents with   Chest Pain    Sonya Garner is a 42 y.o. female who was recently admitted to the hospital with new bilateral pulmonary embolism secondary to left lower extremity DVT who presents with concern for intermittent substernal chest pain since Sunday last week.  She endorses shortness of breath, dizziness, especially shortness of breath worse with exertion.  Patient reports that she has been having intermittent but nearly daily vaginal bleeding secondary to large uterine fibroids since May.  Patient has a plan to follow-up with OB/GYN on August 13 but has not yet seen 1.  She does not take any hormonal birth control.  Patient denies any severe abdominal pain, recent trauma.  She has been taking her Eliquis and iron supplementation as directed.   Chest Pain      Home Medications Prior to Admission medications   Medication Sig Start Date End Date Taking? Authorizing Provider  apixaban (ELIQUIS) 5 MG TABS tablet Take 1 tablet (5 mg total) by mouth 2 (two) times daily. 12/14/22   Rana Snare, DO  ferrous sulfate 325 (65 FE) MG tablet Take 1 tablet (325 mg total) by mouth every other day. 11/26/22   Rana Snare, DO      Allergies    Patient has no known allergies.    Review of Systems   Review of Systems  Cardiovascular:  Positive for chest pain.  All other systems reviewed and are negative.   Physical Exam Updated Vital Signs BP 107/72 (BP Location: Right Arm)   Pulse 80   Temp 97.9 F (36.6 C)   Resp 13   SpO2 100%  Physical Exam Vitals and nursing note reviewed.  Constitutional:      General: She is not in acute distress.    Appearance: Normal appearance.  HENT:     Head: Normocephalic and atraumatic.  Eyes:     General:        Right eye: No discharge.        Left eye: No discharge.      Comments: Pale mucous membranes  Cardiovascular:     Rate and Rhythm: Regular rhythm. Tachycardia present.     Heart sounds: No murmur heard.    No friction rub. No gallop.  Pulmonary:     Effort: Pulmonary effort is normal.     Breath sounds: Normal breath sounds.  Abdominal:     General: Bowel sounds are normal.     Palpations: Abdomen is soft.  Skin:    General: Skin is warm and dry.     Capillary Refill: Capillary refill takes less than 2 seconds.  Neurological:     Mental Status: She is alert and oriented to person, place, and time.  Psychiatric:        Mood and Affect: Mood normal.        Behavior: Behavior normal.     ED Results / Procedures / Treatments   Labs (all labs ordered are listed, but only abnormal results are displayed) Labs Reviewed  BASIC METABOLIC PANEL - Abnormal; Notable for the following components:      Result Value   CO2 20 (*)    Glucose, Bld 107 (*)    All other components within normal limits  CBC - Abnormal; Notable for the following components:   WBC 3.3 (*)  RBC 2.89 (*)    Hemoglobin 6.3 (*)    HCT 21.2 (*)    MCV 73.4 (*)    MCH 21.8 (*)    MCHC 29.7 (*)    RDW 23.6 (*)    nRBC 1.5 (*)    All other components within normal limits  HCG, SERUM, QUALITATIVE  TYPE AND SCREEN  PREPARE RBC (CROSSMATCH)  TROPONIN I (HIGH SENSITIVITY)    EKG EKG Interpretation Date/Time:  Wednesday February 06 2023 06:07:04 EDT Ventricular Rate:  114 PR Interval:  118 QRS Duration:  86 QT Interval:  346 QTC Calculation: 476 R Axis:   73  Text Interpretation: Sinus tachycardia ST & T wave abnormality, consider inferior ischemia Abnormal ECG When compared with ECG of 20-Nov-2022 15:26, PREVIOUS ECG IS PRESENT Confirmed by Kennis Carina [phone]) on 02/06/2023 6:51:38 AM  Radiology DG Chest 2 View  Result Date: 02/06/2023 CLINICAL DATA:  42 year old female with history of intermittent substernal chest pain. EXAM: CHEST - 2 VIEW COMPARISON:  Chest x-ray  11/20/2022. FINDINGS: Lung volumes are normal. No consolidative airspace disease. No pleural effusions. No pneumothorax. No pulmonary nodule or mass noted. Pulmonary vasculature and the cardiomediastinal silhouette are within normal limits. IMPRESSION: No radiographic evidence of acute cardiopulmonary disease. Electronically Signed   By: Trudie Reed M.D.   On: 02/06/2023 06:45    Procedures .Critical Care  Performed by: Olene Floss, PA-C Authorized by: Olene Floss, PA-C   Critical care provider statement:    Critical care time (minutes):  35   Critical care was necessary to treat or prevent imminent or life-threatening deterioration of the following conditions:  Circulatory failure (critical anemia requiring transfusion)   Critical care was time spent personally by me on the following activities:  Development of treatment plan with patient or surrogate, discussions with consultants, evaluation of patient's response to treatment, examination of patient, ordering and review of laboratory studies, ordering and review of radiographic studies, ordering and performing treatments and interventions, pulse oximetry, re-evaluation of patient's condition and review of old charts   Care discussed with: admitting provider       Medications Ordered in ED Medications  0.9 %  sodium chloride infusion (Manually program via Guardrails IV Fluids) (has no administration in time range)    ED Course/ Medical Decision Making/ A&P Clinical Course as of 02/06/23 0832  Wed Feb 06, 2023  0109 Welton Flakes [CP]    Clinical Course User Index [CP] Olene Floss, PA-C                             Medical Decision Making Amount and/or Complexity of Data Reviewed Labs: ordered. Radiology: ordered.   This patient is a 43 y.o. female who presents to the ED for concern of chest pain, shob with exertion, this involves an extensive number of treatment options, and is a complaint that carries  with it a high risk of complications and morbidity. The emergent differential diagnosis prior to evaluation includes, but is not limited to,  ACS, AAS, PE, Mallory-Weiss, Boerhaave's, Pneumonia, acute bronchitis, asthma or COPD exacerbation, anxiety, MSK pain or traumatic injury to the chest, acid reflux versus other, especially in context of her history, suspect possible recurrent symptomatic anemia 2/2 to uterine fibroids . This is not an exhaustive differential.   Past Medical History / Co-morbidities / Social History: ecently admitted to the hospital with new bilateral pulmonary embolism secondary to left lower extremity DVT, anemia,  fibroids  Additional history: Chart reviewed. Pertinent results include: Extensively reviewed patient's recent previous emergency department visits, as well as hospital admission for acute PE, symptomatic anemia related to fibroids   Physical Exam: Physical exam performed. The pertinent findings include: overall well-appearing although somewhat tachycardic on arrival.  She does have some pale mucous membranes.  No significant suprapubic tenderness to palpation.  Discussed and considered pelvic exam and patient reports that she has very minimal bleeding at this time.  Lab Tests: I ordered, and personally interpreted labs.  The pertinent results include: Critical anemia, hemoglobin 6.3, with microcytic quality, likely secondary to blood loss with chronic iron deficiency anemia.  BMP overall unremarkable, troponin less than 2 context of no active chest pain at this time.  Pregnancy test negative.  Type and screen pending.   Imaging Studies: I ordered imaging studies including plain film chest x-ray. I independently visualized and interpreted imaging which showed no evidence of acute intrathoracic abnormality. I agree with the radiologist interpretation.   Cardiac Monitoring:  The patient was maintained on a cardiac monitor.  My attending physician Dr. Rosalia Hammers viewed and  interpreted the cardiac monitored which showed an underlying rhythm of: sinus tachycardia, ST/T wave abnormality in inferior leads. I agree with this interpretation.   Medications: I ordered medication including red blood cells  for critical anemia.   Consultations Obtained: I requested consultation with the hospitalist, spoke with Dr. Welton Flakes,  and discussed lab and imaging findings as well as pertinent plan - they recommend: admission for symptomatic anemia as discussed above  We will consult OB on IM teaching service request.    Disposition: After consideration of the diagnostic results and the patients response to treatment, I feel that patient would benefit from hospital admission at this time .   I discussed this case with my attending physician Dr. Rosalia Hammers who cosigned this note including patient's presenting symptoms, physical exam, and planned diagnostics and interventions. Attending physician stated agreement with plan or made changes to plan which were implemented.    Final Clinical Impression(s) / ED Diagnoses Final diagnoses:  Symptomatic anemia    Rx / DC Orders ED Discharge Orders     None         [place] 02/06/23 7564    Margarita Grizzle, MD 02/07/23 [phone]

## 2023-02-07 ENCOUNTER — Other Ambulatory Visit (HOSPITAL_COMMUNITY): Payer: Self-pay

## 2023-02-07 DIAGNOSIS — N939 Abnormal uterine and vaginal bleeding, unspecified: Secondary | ICD-10-CM | POA: Diagnosis not present

## 2023-02-07 DIAGNOSIS — I824Z2 Acute embolism and thrombosis of unspecified deep veins of left distal lower extremity: Secondary | ICD-10-CM | POA: Diagnosis not present

## 2023-02-07 DIAGNOSIS — I2699 Other pulmonary embolism without acute cor pulmonale: Secondary | ICD-10-CM | POA: Diagnosis not present

## 2023-02-07 DIAGNOSIS — D500 Iron deficiency anemia secondary to blood loss (chronic): Secondary | ICD-10-CM | POA: Diagnosis not present

## 2023-02-07 LAB — RETICULOCYTES
Immature Retic Fract: 36.8 % — ABNORMAL HIGH (ref 2.3–15.9)
RBC.: 3.12 MIL/uL — ABNORMAL LOW (ref 3.87–5.11)
Retic Count, Absolute: 41 10*3/uL (ref 19.0–186.0)
Retic Ct Pct: 1.3 % (ref 0.4–3.1)

## 2023-02-07 LAB — TYPE AND SCREEN
ABO/RH(D): O POS
Antibody Screen: NEGATIVE
Unit division: 0

## 2023-02-07 LAB — CBC
HCT: 22.3 % — ABNORMAL LOW (ref 36.0–46.0)
Hemoglobin: 7.1 g/dL — ABNORMAL LOW (ref 12.0–15.0)
MCH: 24.5 pg — ABNORMAL LOW (ref 26.0–34.0)
MCHC: 31.8 g/dL (ref 30.0–36.0)
MCV: 76.9 fL — ABNORMAL LOW (ref 80.0–100.0)
Platelets: 308 10*3/uL (ref 150–400)
RBC: 2.9 MIL/uL — ABNORMAL LOW (ref 3.87–5.11)
RDW: 23.4 % — ABNORMAL HIGH (ref 11.5–15.5)
WBC: 6.3 10*3/uL (ref 4.0–10.5)
nRBC: 0.5 % — ABNORMAL HIGH (ref 0.0–0.2)

## 2023-02-07 LAB — IRON AND TIBC
Iron: 10 ug/dL — ABNORMAL LOW (ref 28–170)
Saturation Ratios: 2 % — ABNORMAL LOW (ref 10.4–31.8)
TIBC: 454 ug/dL — ABNORMAL HIGH (ref 250–450)
UIBC: 444 ug/dL

## 2023-02-07 LAB — FERRITIN: Ferritin: 5 ng/mL — ABNORMAL LOW (ref 11–307)

## 2023-02-07 LAB — HEMOGLOBIN AND HEMATOCRIT, BLOOD
HCT: 24.4 % — ABNORMAL LOW (ref 36.0–46.0)
Hemoglobin: 7.7 g/dL — ABNORMAL LOW (ref 12.0–15.0)

## 2023-02-07 LAB — BPAM RBC: Unit Type and Rh: 5100

## 2023-02-07 MED ORDER — DIPHENHYDRAMINE HCL 25 MG PO CAPS: 25.0000 mg | ORAL_CAPSULE | Freq: Four times a day (QID) | ORAL | Status: DC | PRN

## 2023-02-07 MED ORDER — SODIUM CHLORIDE 0.9 % IV SOLN
510.0000 mg | Freq: Once | INTRAVENOUS | Status: DC
  Filled 2023-02-07: qty 17

## 2023-02-07 MED ORDER — FERROUS SULFATE 325 (65 FE) MG PO TABS
325.0000 mg | ORAL_TABLET | Freq: Every day | ORAL | 0 refills | Status: DC
Start: 2023-02-08 — End: 2023-03-22
  Filled 2023-02-07: qty 100, 100d supply, fill #0

## 2023-02-07 MED ORDER — FERROUS SULFATE 325 (65 FE) MG PO TABS: 325.0000 mg | ORAL_TABLET | Freq: Every day | ORAL | Status: DC

## 2023-02-07 MED ORDER — DIPHENHYDRAMINE HCL 50 MG/ML IJ SOLN: 12.5000 mg | Freq: Three times a day (TID) | INTRAMUSCULAR | Status: DC | PRN

## 2023-02-07 NOTE — Discharge Summary (Addendum)
Name: Sonya Garner MRN: [id number] DOB: 1980-07-22 42 y.o. PCP: No primary care provider on file.  Date of Admission: 02/06/2023  6:23 AM Date of Discharge:  02/07/2023 Attending Physician: Dr. Dickie La  DISCHARGE DIAGNOSIS:  Primary Problem: Symptomatic anemia   Hospital Problems: Principal Problem:   Symptomatic anemia Active Problems:   Bilateral pulmonary embolism (HCC)   DVT, lower extremity, distal, acute, left (HCC)   Uterine fibroid   Iron deficiency anemia   Abnormal uterine bleeding    DISCHARGE MEDICATIONS:   Allergies as of 02/07/2023       Reactions   Feraheme [ferumoxytol] Shortness Of Breath        Medication List     TAKE these medications    acetaminophen 500 MG tablet Commonly known as: TYLENOL Take 500-1,000 mg by mouth as needed for moderate pain.   apixaban 5 MG Tabs tablet Commonly known as: ELIQUIS Take 1 tablet (5 mg total) by mouth 2 (two) times daily.   ferrous sulfate 325 (65 FE) MG tablet Take 1 tablet (325 mg total) by mouth daily. Start taking on: February 08, 2023 What changed: when to take this   megestrol 40 MG tablet Commonly known as: MEGACE Take 3 tablets (120 mg total) by mouth daily.        DISPOSITION AND FOLLOW-UP:  Sonya Garner was discharged from [facility] in Good condition. At the hospital follow up visit please address:  Symptomatic anemia, secondary to abnormal uterine bleeding: Likely secondary to Eliquis use (for unprovoked PE/DVT in 11/2022) and uterine polyps/fibroids. Hemoglobin 6.3 on admission, received 1 unit packed red blood cells.  Now stable at 7.7 on discharge with improved symptoms.  Recommend repeat CBC, assess for anemia symptoms.  Ensure follow-up with OB/GYN on 8/13 for management of the fibroids.  Ensure she is taking daily iron for her deficiency.  Follow-up Recommendations: Consults: OB/GYN Labs: CBC Studies: None Medications: Oral iron 325 mg daily, megestrol  120 mg daily for 7 days  Follow-up Appointments: You have a follow-up appointment with Dr. Nettie Elm on 8/13 at the address listed below.   Follow-up Information     [facility]. Go on 02/25/2023.   Specialty: Internal Medicine Contact information: [address] [place] [address] [phone]        Hermina Staggers, MD Follow up.   Specialty: Obstetrics and Gynecology Contact information: [address] [place] [address] [phone]                 HOSPITAL COURSE:  Patient Summary: Symptomatic anemia In the setting of uterine polyps/fibroids and Eliquis use Iron deficiency She was last seen in the hospital in May with new unprovoked bilateral pulmonary embolism and a left lower extremity DVT.  At that time, she was started on Eliquis 5 mg twice daily.  Since then, she has had daily abnormal uterine bleeding and soaks through many pads every day.  She started experiencing shortness of breath, dizziness, and vision changes recently, so she came to the emergency department.  Her hemoglobin was 6.3 on arrival to the emergency department, so she was transfused with 1 unit of packed red blood cells.  Her hemoglobin rose to 7.7 and her symptoms improved.  An iron panel demonstrated severe iron deficiency, so IV iron (Feraheme) was ordered.  After a minute or 2 of the infusion of IV iron, she experienced chest tightness and shortness of breath, so the  infusion was discontinued.  She was discharged on oral iron supplementation. During admission, a transvaginal ultrasound was done, which showed echogenicity consistent with uterine polyps/fibroids, which is the likely source of her daily bleeding.  OB/GYN was consulted who gave her leuprolide, premarin, and started her on a 7 day course of megestrol. She has an appointment scheduled to follow-up with Dr. Alysia Penna, an OB/GYN, on August 13th.  With her hemoglobin stabilized  and her symptoms resolved, she was deemed medically stable for discharge.   DISCHARGE INSTRUCTIONS:   Discharge Instructions     Call MD for:  difficulty breathing, headache or visual disturbances   Complete by: As directed    Call MD for:  extreme fatigue   Complete by: As directed    Call MD for:  hives   Complete by: As directed    Call MD for:  persistant dizziness or light-headedness   Complete by: As directed    Call MD for:  severe uncontrolled pain   Complete by: As directed    Diet general   Complete by: As directed    Discharge instructions   Complete by: As directed    It was a pleasure taking care of you at [facility].  We are glad you are feeling better.  You were admitted for symptomatic anemia likely secondary to your uterine bleeding.  Your hemoglobin was low, so we gave you a blood transfusion and it has improved.  Please note the following changes to your medications:  Please take 3 tablets (120 mg total) Megestrol (Megace) daily for the next 7 days.  Please take 1 tablet (325 mg) daily of oral iron.  You may continue taking your other home medications as prescribed, including Eliquis 5 mg (1 tablet) two times daily.  We have scheduled you a hospital follow-up appointment on August 12 at 9:30 AM at the internal medicine center on the ground-floor of [facility].  You have a follow up appointment with Dr. Alysia Penna, your OB/GYN, on 8/13 at 9:35 am.   Increase activity slowly   Complete by: As directed        SUBJECTIVE:   She was evaluated at bedside this morning.  She was doing well, her lightheadedness and dizziness had resolved.  She was no longer experiencing shortness of breath.  Discharge Vitals:   BP 115/77   Pulse 87   Temp 98.3 F (36.8 C) (Oral)   Resp 18   Ht 5\' 4"  (1.626 m)   Wt 61.3 kg   SpO2 100%   BMI 23.20 kg/m   OBJECTIVE:  Physical Exam Cardiovascular:     Rate and Rhythm: Normal rate and regular rhythm.      Heart sounds: Normal heart sounds.  Pulmonary:     Effort: Pulmonary effort is normal.     Breath sounds: Normal breath sounds.  Neurological:     Mental Status: She is alert and oriented to person, place, and time.     Pertinent Labs, Studies, and Procedures:     Latest Ref Rng & Units 02/07/2023   11:15 AM 02/07/2023    2:08 AM 02/06/2023    4:21 PM  CBC  WBC 4.0 - 10.5 K/uL  6.3    Hemoglobin 12.0 - 15.0 g/dL 7.7  7.1  7.4   Hematocrit 36.0 - 46.0 % 24.4  22.3  23.8   Platelets 150 - 400 K/uL  308         Latest Ref Rng &  Units 02/06/2023    6:29 AM 11/20/2022   10:45 AM 11/20/2022    8:20 AM  CMP  Glucose 70 - 99 mg/dL 119   147   BUN 6 - 20 mg/dL 10   9   Creatinine 8.29 - 1.00 mg/dL 5.62   1.30   Sodium 865 - 145 mmol/L 141   133   Potassium 3.5 - 5.1 mmol/L 3.6   3.8   Chloride 98 - 111 mmol/L 108   101   CO2 22 - 32 mmol/L 20   22   Calcium 8.9 - 10.3 mg/dL 9.0   8.9   Total Protein 6.5 - 8.1 g/dL  6.5    Total Bilirubin 0.3 - 1.2 mg/dL  0.4    Alkaline Phos 38 - 126 U/L  41    AST 15 - 41 U/L  20    ALT 0 - 44 U/L  25     Ferritin: 5, Iron: 10, TIBC: 454, Saturation: 2  Imaging:  US PELVIC COMPLETE WITH TRANSVAGINAL Result Date: 02/06/2023 IMPRESSION: There is inhomogeneous echogenicity in uterus with multiple fibroids. There are multiple echogenic foci in the endometrial cavity measuring up to 2.6 cm in maximum diameter along with trace amount of fluid. Findings may suggest endometrial polyps are pedunculated submucosal fibroids. Follow-up sonohysterogram may be considered. There are no adnexal masses.  There is no free fluid in pelvis. Electronically Signed   By: Ernie Avena M.D.   On: 02/06/2023 19:15    Signed: Annett Fabian, MD Internal Medicine Resident, PGY-1 [facility]  Pager: [phone] 3:20 PM, 02/07/2023

## 2023-02-07 NOTE — TOC CM/SW Note (Signed)
Transition of Care [facility]) - Inpatient Brief Assessment   Patient Details  Name: Sonya Garner MRN: [id number] Date of Birth: [birth year]  Transition of Care [facility]) CM/SW Contact:    Harriet Masson, RN Phone Number: 02/07/2023, 12:38 PM   Clinical Narrative: Patient admitted for symptomatic anemia.  OBGYN is following.  TOC following.   Transition of Care Asessment: Insurance and Status: Insurance coverage has been reviewed Patient has primary care physician: Yes Home environment has been reviewed: safe to discharge home when medically stable Prior level of function:: independent Prior/Current Home Services: No current home services Social Determinants of Health Reivew: SDOH reviewed no interventions necessary Readmission risk has been reviewed: Yes Transition of care needs: no transition of care needs at this time

## 2023-02-07 NOTE — Hospital Course (Addendum)
Symptomatic anemia In the setting of uterine polyps/fibroids and Eliquis use Iron deficiency She was last seen in the hospital in May with new unprovoked bilateral pulmonary embolism secondary to a left lower extremity DVT.  At that time, she was started on Eliquis 5 mg twice daily.  Since then, she has had daily abnormal uterine bleeding and soaks through many pads every day.  She started experiencing shortness of breath, dizziness, and vision changes recently, so she came to the emergency department.  Her hemoglobin was 6.3 on arrival to the emergency department, so she was transfused with 1 unit of packed red blood cells.  Her hemoglobin rose to 7.7 and her symptoms improved.  An iron panel demonstrated severe iron deficiency, so IV iron (Feraheme) was ordered.  After a minute or 2 of the infusion of IV iron, she experienced chest tightness and shortness of breath, so the infusion was discontinued.  Her oral iron was changed from every other day to every day to make up for the iron deficiency.  Transvaginal ultrasound was done, which showed echogenicity consistent with uterine polyps/fibroids, which is the likely source of her daily bleeding.  OB/GYN was consulted who gave her leuprolide, premarin, and started her on a 7 day course of megestrol. She has an appointment scheduled to follow-up with Dr. Alysia Penna, an OB/GYN, on August 13th.  With her hemoglobin stabilized and her symptoms resolved, she was deemed medically stable for discharge.

## 2023-02-08 ENCOUNTER — Encounter: Payer: Self-pay | Admitting: Obstetrics & Gynecology

## 2023-02-25 ENCOUNTER — Encounter: Payer: Self-pay | Admitting: Student

## 2023-02-25 ENCOUNTER — Ambulatory Visit (INDEPENDENT_AMBULATORY_CARE_PROVIDER_SITE_OTHER): Payer: Managed Care, Other (non HMO) | Admitting: Student

## 2023-02-25 VITALS — BP 119/80 | HR 97 | Temp 98.7°F | Ht 64.0 in | Wt 153.8 lb

## 2023-02-25 DIAGNOSIS — N939 Abnormal uterine and vaginal bleeding, unspecified: Secondary | ICD-10-CM

## 2023-02-25 DIAGNOSIS — D509 Iron deficiency anemia, unspecified: Secondary | ICD-10-CM | POA: Diagnosis not present

## 2023-02-25 NOTE — Patient Instructions (Signed)
Thank you, Ms.Dorisa Brisbane Kutch for allowing [place] to provide your care today.   I have ordered the following labs for you:   Lab Orders         CBC with Diff       RefeI have ordered the following medication/changed the following medications:   Stop the following medications: Medications Discontinued During This Encounter  Medication Reason   megestrol (MEGACE) 40 MG tablet         Follow up:  1 month     Remember: To call us if you develop worsening symptoms of Shortness of breath and chest pain or if your bleeding worsens significantly before your see your OBGYN. If severe, please head to ED.  Should you have any questions or concerns please call the internal medicine clinic at [phone].     Manuela Neptune, MD [facility]

## 2023-02-25 NOTE — Progress Notes (Deleted)
New Patient Office Visit  Subjective    Patient ID: Sonya Garner, female    DOB: May 14, 1981  Age: 42 y.o. MRN: [id number]  CC: No chief complaint on file.   HPI Sonya Garner presents      Outpatient Encounter Medications as of 02/25/2023  Medication Sig   acetaminophen (TYLENOL) 500 MG tablet Take 500-1,000 mg by mouth as needed for moderate pain.   apixaban (ELIQUIS) 5 MG TABS tablet Take 1 tablet (5 mg total) by mouth 2 (two) times daily.   ferrous sulfate 325 (65 FE) MG tablet Take 1 tablet (325 mg total) by mouth daily for 30 days then as directed by MD   megestrol (MEGACE) 40 MG tablet Take 3 tablets (120 mg total) by mouth daily. (Patient not taking: Reported on 02/06/2023)   No facility-administered encounter medications on file as of 02/25/2023.    Past Medical History:  Diagnosis Date   Chronic kidney disease    kidney stone   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   Qualifier: Diagnosis of   By: Levada Schilling       Mild concussion 12/05/2016    Past Surgical History:  Procedure Laterality Date   TUBAL LIGATION      Family History  Problem Relation Age of Onset   Breast cancer Maternal Aunt    Cancer Maternal Aunt        breast   Cancer Maternal Uncle    Breast cancer Maternal Grandmother    Cancer Maternal Grandmother    Hearing loss Neg Hx     Social History   Socioeconomic History   Marital status: Single    Spouse name: Not on file   Number of children: Not on file   Years of education: Not on file   Highest education level: Not on file  Occupational History   Not on file  Tobacco Use   Smoking status: Every Day    Current packs/day: 0.25    Average packs/day: 0.3 packs/day for 10.0 years (2.5 ttl pk-yrs)    Types: Cigarettes   Smokeless tobacco: Never   Tobacco comments:    Stopped 11/19/2022   Substance and Sexual Activity   Alcohol use: No   Drug use: No   Sexual activity: Yes    Birth control/protection: Surgical  Other Topics  Concern   Not on file  Social History Narrative   Not on file   Social Determinants of Health   Financial Resource Strain: Not on file  Food Insecurity: No Food Insecurity (02/06/2023)   Hunger Vital Sign    Worried About Running Out of Food in the Last Year: Never true    Ran Out of Food in the Last Year: Never true  Transportation Needs: No Transportation Needs (02/06/2023)   PRAPARE - Administrator, Civil Service (Medical): No    Lack of Transportation (Non-Medical): No  Physical Activity: Not on file  Stress: Not on file  Social Connections: Moderately Isolated (11/26/2022)   Social Connection and Isolation Panel [NHANES]    Frequency of Communication with Friends and Family: More than three times a week    Frequency of Social Gatherings with Friends and Family: More than three times a week    Attends Religious Services: Never    Database administrator or Organizations: No    Attends Banker Meetings: Never    Marital Status: Living with partner  Intimate Partner Violence: Not At Risk (02/06/2023)   Humiliation, Afraid,  Rape, and Kick questionnaire    Fear of Current or Ex-Partner: No    Emotionally Abused: No    Physically Abused: No    Sexually Abused: No    ROS      Objective    There were no vitals taken for this visit.  Physical Exam  {Labs (Optional):23779}    Assessment & Plan:   Problem List Items Addressed This Visit   None   No follow-ups on file.   Manuela Neptune, MD

## 2023-02-25 NOTE — Progress Notes (Signed)
Established Patient Office Visit  Subjective   Patient ID: Sonya Garner, female    DOB: September 29, 1980  Age: 42 y.o. MRN: [id number]  Chief Complaint  Patient presents with   Hospitalization Follow-up    HPI   Sonya Garner is a 42 y.o. coming to follow up with [place] after her hospitalization in July. She was seen by [place] on 5/13 after her hospitalization in May for symptomatic anemia and bilateral pulmonary emboli and lower extremity DVT. MRI was done in MAY due to lower back pain throught to be sciatica which showed a fibroid uterus. At the time it was noted that her platelet count had dropped after her hospitalization to 114 from 191. She hd reported not signs of active bleeding, had no rashes. She received a unit of blood and IV iron. She was was prescribed Eliquis 5mg  BID and started having increased bleeding. 7/24, she developed SOB chest pain and fatigue and was found to have symptomatic anemia. Was admitted and received a unit of blood. Of note, she had an allergic reaction to ferraheme (SOB) and infusion was discontinued. Was discharged on iron tablets hgb 2 weeks ago was 7.7. Transvaginal US was done and showed uterine fibroids thought to be the cause of her AUB. OB/GYN was consulted while she was inpatient and received received premarin 64m IV (conjugated estrogen) with megestrol 120 mg and lupron (leuprolide) 11.25 mg IM. Has an appointment with OBGYN on the 13th to follow up on this. She does smoke.   Reports a normal pap smear from the health department in May. Mammo 6/25 not concerning for any malignancy.  Bleeding stopped after megestrol- she took it for 7 days. Bleeding started yesterday. Bleeding is not as heavy as it was before.  Has been wearing an ON pad and usually changes it every 45 minutes vs 30 minutes. Has not had to change her pad today since 8:45AM. Has Chest pain and intermittent SOB. The chest pain is gripping does not radiate to arm. No severity of symptoms as this  is her baseline past her PE/DVT events.   Has history of cancer in family. Maternal side and paternal side known to have cancer. Grandma and aunt passed from breast cancer. Uncles passed away from prostate. Father's side paternal grandmother died of cancer but unknown what kind. Unknown why she developed the DVT/PE. Was on Megestrol, smoker, and is 42 yo.   ROS   Negative unless stated above.    Objective:     BP 119/80 (BP Location: Left Arm, Patient Position: Sitting, Cuff Size: Normal)   Pulse 97   Temp 98.7 F (37.1 C) (Oral)   Ht 5\' 4"  (1.626 m)   Wt 153 lb 12.8 oz (69.8 kg)   LMP 02/24/2023 (Exact Date)   SpO2 100%   BMI 26.40 kg/m  BP Readings from Last 3 Encounters:  02/25/23 119/80  02/07/23 115/77  11/26/22 112/77   Wt Readings from Last 3 Encounters:  02/25/23 153 lb 12.8 oz (69.8 kg)  02/06/23 135 lb 2.3 oz (61.3 kg)  11/26/22 135 lb 1.6 oz (61.3 kg)     Physical Exam Constitutional:      General: She is not in acute distress.    Appearance: She is normal weight. She is not ill-appearing.  Cardiovascular:     Rate and Rhythm: Regular rhythm. Tachycardia present.     Pulses: No decreased pulses.     Heart sounds: S1 normal and S2 normal. No murmur heard.  No friction rub. No gallop. No S3 or S4 sounds.  Pulmonary:     Effort: No respiratory distress.     Breath sounds: Normal breath sounds. No stridor. No wheezing or rales.  Abdominal:     General: There is no distension.     Tenderness: There is no abdominal tenderness.  Musculoskeletal:     Right lower leg: No edema.     Left lower leg: No edema.  Neurological:     Mental Status: She is alert.   Last CBC Lab Results  Component Value Date   WBC 6.3 02/07/2023   HGB 7.7 (L) 02/07/2023   HCT 24.4 (L) 02/07/2023   MCV 76.9 (L) 02/07/2023   MCH 24.5 (L) 02/07/2023   RDW 23.4 (H) 02/07/2023   PLT 308 02/07/2023   Last metabolic panel Lab Results  Component Value Date   GLUCOSE 107 (H)  02/06/2023   NA 141 02/06/2023   K 3.6 02/06/2023   CL 108 02/06/2023   CO2 20 (L) 02/06/2023   BUN 10 02/06/2023   CREATININE 0.75 02/06/2023   GFRNONAA >60 02/06/2023   CALCIUM 9.0 02/06/2023   PROT 6.5 11/20/2022   ALBUMIN 3.3 (L) 11/20/2022   BILITOT 0.4 11/20/2022   ALKPHOS 41 11/20/2022   AST 20 11/20/2022   ALT 25 11/20/2022   ANIONGAP 13 02/06/2023     The 10-year ASCVD risk score (Arnett DK, et al., 2019) is: 0.7%    Assessment & Plan:   Problem List Items Addressed This Visit       Genitourinary   Abnormal uterine bleeding - Primary   Relevant Orders   CBC with Diff     Other   Iron deficiency anemia (Chronic)    S/p 1 unit of pRBC and on Po iron. Iron labs in July consistent with Iron deficiency anemia. Has uterine fibroids on transvaginal US. Received megestrol, premarin, and leuprolide 7/24. Finished 5 day course of megestrol upon discharge. Bleeding restarted yesterday. Not as bad as it used to be. (See HPI). Has SOB and chest pain that is not significantly changed from her baseline past her PE. Recheck CBC with diff today.  -Cw Iron tablets 325mg  daily  -Will call back with results should you need a transfusion  -Call us if bleeding worsens before seeing your OBGYN  -if symptoms are severe head to ED -Follow up with your OBGYN on 8/29       Return in about 4 weeks (around 03/25/2023).    Manuela Neptune, MD

## 2023-02-25 NOTE — Assessment & Plan Note (Addendum)
S/p 1 unit of pRBC and on Po iron. Iron labs in July consistent with Iron deficiency anemia. Has uterine fibroids on transvaginal US. Received megestrol, premarin, and leuprolide 7/24. Finished 5 day course of megestrol upon discharge. Bleeding restarted yesterday. Not as bad as it used to be. (See HPI). Has SOB and chest pain that is not significantly changed from her baseline past her PE. Recheck CBC with diff today.  -Cw Iron tablets 325mg  daily  -Will call back with results should you need a transfusion  -Call us if bleeding worsens before seeing your OBGYN  -if symptoms are severe head to ED -Follow up with your OBGYN on 8/29

## 2023-02-26 ENCOUNTER — Encounter: Payer: Self-pay | Admitting: Obstetrics and Gynecology

## 2023-02-26 NOTE — Progress Notes (Signed)
Hgb much improved, consistent with decreased bleeding. Pt is to be seen at [facility] visit 8/29.

## 2023-02-26 NOTE — Progress Notes (Signed)
Internal Medicine Clinic Attending  I was physically present during the key portions of the resident provided service and participated in the medical decision making of patient's management care. I reviewed pertinent patient test results.  The assessment, diagnosis, and plan were formulated together and I agree with the documentation in the resident's note.  Bleeding completely stopped after recent hospital discharge. Ms. Creedon completed 7 day course of Megestrol. Yesterday she noted recurrence of bleeding although much less than prior. No new or worsening symptoms of anemia. CBC today. F/u with gynecology scheduled for later this month.  Dickie La, MD

## 2023-03-07 ENCOUNTER — Encounter (HOSPITAL_COMMUNITY): Payer: Self-pay

## 2023-03-07 ENCOUNTER — Emergency Department (HOSPITAL_COMMUNITY): Payer: Managed Care, Other (non HMO)

## 2023-03-07 ENCOUNTER — Other Ambulatory Visit: Payer: Self-pay

## 2023-03-07 ENCOUNTER — Emergency Department (HOSPITAL_COMMUNITY)
Admission: EM | Admit: 2023-03-07 | Discharge: 2023-03-07 | Disposition: A | Discharge status: Home / Self Care | Payer: Managed Care, Other (non HMO)

## 2023-03-07 ENCOUNTER — Ambulatory Visit: Payer: Managed Care, Other (non HMO) | Admitting: Student

## 2023-03-07 DIAGNOSIS — N939 Abnormal uterine and vaginal bleeding, unspecified: Secondary | ICD-10-CM

## 2023-03-07 DIAGNOSIS — R109 Unspecified abdominal pain: Secondary | ICD-10-CM | POA: Insufficient documentation

## 2023-03-07 DIAGNOSIS — R5383 Other fatigue: Secondary | ICD-10-CM | POA: Diagnosis not present

## 2023-03-07 DIAGNOSIS — R079 Chest pain, unspecified: Secondary | ICD-10-CM | POA: Diagnosis present

## 2023-03-07 DIAGNOSIS — Z7901 Long term (current) use of anticoagulants: Secondary | ICD-10-CM | POA: Insufficient documentation

## 2023-03-07 DIAGNOSIS — R0602 Shortness of breath: Secondary | ICD-10-CM | POA: Diagnosis not present

## 2023-03-07 DIAGNOSIS — D509 Iron deficiency anemia, unspecified: Secondary | ICD-10-CM | POA: Diagnosis not present

## 2023-03-07 DIAGNOSIS — Z86711 Personal history of pulmonary embolism: Secondary | ICD-10-CM | POA: Insufficient documentation

## 2023-03-07 DIAGNOSIS — Z86718 Personal history of other venous thrombosis and embolism: Secondary | ICD-10-CM | POA: Insufficient documentation

## 2023-03-07 DIAGNOSIS — R42 Dizziness and giddiness: Secondary | ICD-10-CM | POA: Diagnosis not present

## 2023-03-07 DIAGNOSIS — N189 Chronic kidney disease, unspecified: Secondary | ICD-10-CM | POA: Insufficient documentation

## 2023-03-07 LAB — CBC
HCT: 32.8 % — ABNORMAL LOW (ref 36.0–46.0)
Hemoglobin: 10.2 g/dL — ABNORMAL LOW (ref 12.0–15.0)
MCH: 25.9 pg — ABNORMAL LOW (ref 26.0–34.0)
MCHC: 31.1 g/dL (ref 30.0–36.0)
MCV: 83.2 fL (ref 80.0–100.0)
Platelets: 296 10*3/uL (ref 150–400)
RBC: 3.94 MIL/uL (ref 3.87–5.11)
RDW: 25 % — ABNORMAL HIGH (ref 11.5–15.5)
WBC: 4.7 10*3/uL (ref 4.0–10.5)
nRBC: 0 % (ref 0.0–0.2)

## 2023-03-07 LAB — BASIC METABOLIC PANEL
Anion gap: 8 (ref 5–15)
BUN: 10 mg/dL (ref 6–20)
CO2: 24 mmol/L (ref 22–32)
Calcium: 9 mg/dL (ref 8.9–10.3)
Chloride: 106 mmol/L (ref 98–111)
Creatinine, Ser: 0.87 mg/dL (ref 0.44–1.00)
GFR, Estimated: >60 mL/min (ref >60)
Glucose, Bld: 99 mg/dL (ref 70–99)
Potassium: 3.6 mmol/L (ref 3.5–5.1)
Sodium: 138 mmol/L (ref 135–145)

## 2023-03-07 LAB — TROPONIN I (HIGH SENSITIVITY): Troponin I (High Sensitivity): 3 ng/L (ref <18)

## 2023-03-07 LAB — HCG, SERUM, QUALITATIVE: Preg, Serum: NEGATIVE

## 2023-03-07 NOTE — Assessment & Plan Note (Signed)
Patient was a evaluated for a prolonged and heavy menstrual cycle.  She has a history of severe anemia due to a prolonged and heavy bleeding menstrual cycle in July 2024.  While she was in the hospital she had a hemoglobin of 6.3 and required IV iron transfusion along with a unit of blood.  She was provided megestrol which stopped her menstrual cycle; however, she ran out of the medication and her menstrual cycle started again about 9 days ago.  She reports that she was requiring a pad change every 30 minutes throughout the entire menstrual cycle.  She is having symptoms of fatigue, abdominal cramping, feeling lightheaded throughout the week, intermittent chest pain and shortness of breath that both worsen with exertion.  Patient is likely experiencing symptomatic anemia due to history of blood transfusion 1 month ago, she will need a CBC and possible true blood transfusion.  Since this is telehealth appointment and the clinic does not provide blood transfusions, patient was advised to go to the emergency department.  The patient also has a history of a bilateral PE in May and she has been on Eliquis since then, today the patient is complaining of chest pain and shortness of breath that worsens with exertion; although, the patient states this is baseline. Further cardiac evaluation such as an EKG is warranted.  Once again, patient was advised to go to emergency department and she agreed. Plan -Patient advised to get emergency department for CBC and possible blood transfusion -Cardiac evaluation is warranted -Emergency department charge nurse was called and notified

## 2023-03-07 NOTE — Progress Notes (Signed)
  [facility] Internal Medicine Residency Telephone Encounter Continuity Care Appointment  HPI:  This telephone encounter was created for [name] on 03/07/2023 for the following purpose/cc heavy menstrual bleeding.   Past Medical History:  Past Medical History:  Diagnosis Date   Chronic kidney disease    kidney stone   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   Qualifier: Diagnosis of   By: Levada Schilling       Mild concussion 12/05/2016     ROS:     Assessment / Plan / Recommendations:  Please see A&P under problem oriented charting for assessment of the patient's acute and chronic medical conditions.   Abnormal uterine bleeding, iron deficiency anemia Patient was a evaluated for a prolonged and heavy menstrual cycle.  She has a history of severe anemia due to a prolonged and heavy bleeding menstrual cycle in July 2024.  While she was in the hospital she had a hemoglobin of 6.3 and required IV iron transfusion along with a unit of blood.  She was provided megestrol which stopped her menstrual cycle; however, she ran out of the medication and her menstrual cycle started again about 9 days ago.  She reports that she was requiring a pad change every 30 minutes throughout the entire menstrual cycle.  She is having symptoms of fatigue, abdominal cramping, feeling lightheaded throughout the week, intermittent chest pain and shortness of breath that both worsen with exertion.  Patient is likely experiencing symptomatic anemia due to history of blood transfusion 1 month ago, she will need a CBC and possible true blood transfusion.  Since this is telehealth appointment and the clinic does not provide blood transfusions, patient was advised to go to the emergency department.  The patient also has a history of a bilateral PE in May and she has been on Eliquis since then, today the patient is complaining of chest pain and shortness of breath that worsens with exertion; although, the patient states this  is baseline. Further cardiac evaluation such as an EKG is warranted.  Once again, patient was advised to go to emergency department and she agreed. Plan -Patient advised to get emergency department for CBC and possible blood transfusion -Cardiac evaluation is warranted -Emergency department charge nurse was called and notified   As always, pt is advised that if symptoms worsen or new symptoms arise, they should go to an urgent care facility or to to ER for further evaluation.    Consent and Medical Decision Making:  Patient seen with Dr.  Lafonda Mosses This is a telephone encounter between Sonya Garner and Sonya Garner on 03/07/2023 for prolonged and heavy menstrual cycle. The visit was conducted with the patient located at  work in [facility]  and Sonya Garner at [facility]. The patient's identity was confirmed using their DOB and current address. The patient has consented to being evaluated through a telephone encounter and understands the associated risks (an examination cannot be done and the patient may need to come in for an appointment) / benefits (allows the patient to remain at home, decreasing exposure to coronavirus). I personally spent 20 minutes on medical discussion.

## 2023-03-07 NOTE — ED Provider Notes (Signed)
[place] EMERGENCY DEPARTMENT AT [facility] Provider Note   CSN: [id number] Arrival date & time: 03/07/23  1144     History  Chief Complaint  Patient presents with   Shortness of Breath    Sonya Garner is a 42 y.o. female with a history of CKD, DVT and PE on Eliquis presents today for evaluation of chest pain, shortness of breath and vaginal bleeding.  Of note patient has history of significant anemia due to prolonged and heavy bleeding menstrual cycle since July 2024.  She was provided megestrol with stop her menstrual cycle however she ran out of the medication and her menstrual cycle started again about 9 days ago.  She reports that she was requiring a pad change every 30 minutes throughout the entire menstrual cycle.  Today she reports symptoms of fatigue, abdominal cramping, lightheadedness, intermittent chest pain and shortness of breath that but worsened with exertion.   Shortness of Breath   Past Medical History:  Diagnosis Date   Chronic kidney disease    kidney stone   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   Qualifier: Diagnosis of   By: Levada Schilling       Mild concussion 12/05/2016   Past Surgical History:  Procedure Laterality Date   TUBAL LIGATION       Home Medications Prior to Admission medications   Medication Sig Start Date End Date Taking? Authorizing Provider  acetaminophen (TYLENOL) 500 MG tablet Take 500-1,000 mg by mouth as needed for moderate pain.    [provider]  apixaban (ELIQUIS) 5 MG TABS tablet Take 1 tablet (5 mg total) by mouth 2 (two) times daily. 12/14/22   Rana Snare, DO  ferrous sulfate 325 (65 FE) MG tablet Take 1 tablet (325 mg total) by mouth daily for 30 days then as directed by MD 02/08/23   Annett Fabian, MD      Allergies    Feraheme [ferumoxytol]    Review of Systems   Review of Systems  Respiratory:  Positive for shortness of breath.     Physical Exam Updated Vital Signs BP 115/81   Pulse 82    Temp 98.2 F (36.8 C)   Resp 20   Ht 5\' 4"  (1.626 m)   Wt 69.4 kg   LMP 02/24/2023 (Exact Date)   SpO2 100%   BMI 26.26 kg/m  Physical Exam Vitals and nursing note reviewed.  Constitutional:      Appearance: Normal appearance.  HENT:     Head: Normocephalic and atraumatic.     Mouth/Throat:     Mouth: Mucous membranes are moist.  Eyes:     General: No scleral icterus. Cardiovascular:     Rate and Rhythm: Normal rate and regular rhythm.     Pulses: Normal pulses.     Heart sounds: Normal heart sounds.  Pulmonary:     Effort: Pulmonary effort is normal.     Breath sounds: Normal breath sounds.  Abdominal:     General: Abdomen is flat.     Palpations: Abdomen is soft.     Tenderness: There is no abdominal tenderness.  Musculoskeletal:        General: No deformity.  Skin:    General: Skin is warm.     Findings: No rash.  Neurological:     General: No focal deficit present.     Mental Status: She is alert.  Psychiatric:        Mood and Affect: Mood normal.     ED  Results / Procedures / Treatments   Labs (all labs ordered are listed, but only abnormal results are displayed) Labs Reviewed  BASIC METABOLIC PANEL  CBC  HCG, SERUM, QUALITATIVE  TROPONIN I (HIGH SENSITIVITY)    EKG None  Radiology DG Chest 2 View  Result Date: 03/07/2023 CLINICAL DATA:  Chest pain and shortness of breath.  Chronic anemia. EXAM: CHEST - 2 VIEW COMPARISON:  Chest radiographs 02/06/2023 and 11/20/2022 FINDINGS: Cardiac silhouette and mediastinal contours are within normal limits. The lungs are clear. No pleural effusion or pneumothorax. No acute skeletal abnormality. IMPRESSION: No active cardiopulmonary disease. Electronically Signed   By: Neita Garnet M.D.   On: 03/07/2023 12:46    Procedures Procedures    Medications Ordered in ED Medications - No data to display  ED Course/ Medical Decision Making/ A&P                                 Medical Decision Making Amount  and/or Complexity of Data Reviewed Labs: ordered. Radiology: ordered.   This patient presents to the ED for chest pain, shortness of breath, vaginal bleeding, this involves an extensive number of treatment options, and is a complaint that carries with a high risk of complications and morbidity.  The differential diagnosis includes ACS/MI, pneumonia, pneumothorax, pericarditis, asthma, COPD, fibroids, coagulopathy, trauma, polyps, endometrium.  This is not an exhaustive list.  Lab tests: I ordered and personally interpreted labs.  The pertinent results include: WBC unremarkable. Hbg 10.2. Platelets unremarkable. Electrolytes unremarkable. BUN, creatinine unremarkable.  Troponins negative.  hCG is negative.  Imaging studies: I ordered imaging studies, personally reviewed, interpreted imaging and agree with the radiologist's interpretations. The results include: Chest x-ray is negative.  Problem list/ ED course/ Critical interventions/ Medical management: HPI: See above Vital signs within normal range and stable throughout visit. Laboratory/imaging studies significant for: See above. On physical examination, patient is afebrile and appears in no acute distress.  CBC with hemoglobin of 10.2.  BMP with no acute electrolyte abnormalities or AKI.  Unlikely ACS/MI, EKG with no ischemic changes, normal troponin, negative chest x-ray.  No evidence of pneumonia or pneumothorax on chest x-ray.  Patient has been compliant with her Eliquis.  Clinical presentation not consistent with PE. Patient also reports heavy vaginal bleeding since the beginning of her menstrual cycle a week ago.  Hemoglobin of 10.2.  Patient is overall hemodynamically stable.  Blood pressure is normal.  She denies any abdominal pain, nausea, vomiting.  No evidence of other acute emergent causes of vaginal bleeding.  I consult OB/GYN who recommended no intervention at this point and patient can follow-up with her OB/GYN outpatient next  week. I have reviewed the patient home medicines and have made adjustments as needed.  Cardiac monitoring/EKG: The patient was maintained on a cardiac monitor.  I personally reviewed and interpreted the cardiac monitor which showed an underlying rhythm of: sinus rhythm.  Additional history obtained: External records from outside source obtained and reviewed including: Chart review including previous notes, labs, imaging.  Consultations obtained: I spoke to Dr. Despina Hidden OB/GY[facility] who recommended no intervention at this point, patient can follow-up with her OB/GYN outpatient next week.  Disposition Continued outpatient therapy. Follow-up with OB/GYN and PCP recommended for reevaluation of symptoms. Treatment plan discussed with patient.  Pt acknowledged understanding was agreeable to the plan. Worrisome signs and symptoms were discussed with patient, and patient acknowledged understanding to return to the  ED if they noticed these signs and symptoms. Patient was stable upon discharge.   This chart was dictated using voice recognition software.  Despite best efforts to proofread,  errors can occur which can change the documentation meaning.          Final Clinical Impression(s) / ED Diagnoses Final diagnoses:  Shortness of breath  Chest pain, unspecified type  Vaginal bleeding    Rx / DC Orders ED Discharge Orders     None         Jeanelle Malling, [place] 03/07/23 1414    Coral Spikes, DO 03/07/23 1633

## 2023-03-07 NOTE — ED Notes (Signed)
Patient verbalizes understanding of discharge instructions. Opportunity for questioning and answers were provided. Armband removed by staff, pt discharged from ED. Pt ambulatory to ED waiting room with steady gait.  

## 2023-03-07 NOTE — ED Triage Notes (Signed)
Pt is coming in for chest pain and shortness of breath that has been ongoing for a couple of days. This also has been a recent chronic issue that has been due to her chronic anemia. She went to her pcp today for this chest pain and shortness of breath and they sent her here to get her blood levels checked. She is otherwise stable in the room.

## 2023-03-07 NOTE — Discharge Instructions (Addendum)
Please take  tylenol/ibuprofen for pain. I recommend close follow-up with OB-GYN and PCP for reevaluation.  Please do not hesitate to return to emergency department if worrisome signs symptoms we discussed become apparent.

## 2023-03-08 NOTE — Progress Notes (Signed)
Internal Medicine Clinic Attending  Case discussed with the resident at the time of the visit.  I was physically present with Dr. Hessie Diener on the phone with patient. I agree with the assessment, diagnosis, and plan of care documented in the resident's note.     I shared Dr Larrie Kass concerns about patient's heavy bleeding, fatigue, and new lightheadedness & chest pain. I agreed with plan to go to ER for urgent H/H, since she has needed blood transfusion previously.   Follow up- she went to ER, Hgb was 10.2, OB/GYN consulted and will see her next week in clinic

## 2023-03-14 ENCOUNTER — Encounter: Payer: Self-pay | Admitting: Obstetrics and Gynecology

## 2023-03-22 ENCOUNTER — Other Ambulatory Visit: Payer: Self-pay | Admitting: Student

## 2023-03-22 ENCOUNTER — Other Ambulatory Visit (HOSPITAL_COMMUNITY): Payer: Self-pay

## 2023-03-22 MED ORDER — FERROUS SULFATE 325 (65 FE) MG PO TABS
325.0000 mg | ORAL_TABLET | Freq: Every day | ORAL | 0 refills | Status: DC
  Filled 2023-03-22: qty 100, 100d supply, fill #0

## 2023-03-28 ENCOUNTER — Other Ambulatory Visit (HOSPITAL_COMMUNITY)
Admission: RE | Admit: 2023-03-28 | Discharge: 2023-03-28 | Disposition: A | Discharge status: Home / Self Care | Payer: Managed Care, Other (non HMO) | Source: Ambulatory Visit | Attending: Obstetrics and Gynecology | Admitting: Obstetrics and Gynecology

## 2023-03-28 ENCOUNTER — Encounter: Payer: Self-pay | Admitting: Obstetrics and Gynecology

## 2023-03-28 ENCOUNTER — Other Ambulatory Visit: Payer: Self-pay

## 2023-03-28 ENCOUNTER — Ambulatory Visit (INDEPENDENT_AMBULATORY_CARE_PROVIDER_SITE_OTHER): Payer: Managed Care, Other (non HMO) | Admitting: Obstetrics and Gynecology

## 2023-03-28 VITALS — BP 116/78 | HR 104 | Ht 64.0 in | Wt 154.3 lb

## 2023-03-28 DIAGNOSIS — I2699 Other pulmonary embolism without acute cor pulmonale: Secondary | ICD-10-CM | POA: Diagnosis not present

## 2023-03-28 DIAGNOSIS — D259 Leiomyoma of uterus, unspecified: Secondary | ICD-10-CM

## 2023-03-28 DIAGNOSIS — Z124 Encounter for screening for malignant neoplasm of cervix: Secondary | ICD-10-CM

## 2023-03-28 DIAGNOSIS — D509 Iron deficiency anemia, unspecified: Secondary | ICD-10-CM | POA: Diagnosis not present

## 2023-03-28 DIAGNOSIS — Z01411 Encounter for gynecological examination (general) (routine) with abnormal findings: Secondary | ICD-10-CM | POA: Diagnosis not present

## 2023-03-28 DIAGNOSIS — Z01419 Encounter for gynecological examination (general) (routine) without abnormal findings: Secondary | ICD-10-CM | POA: Insufficient documentation

## 2023-03-28 MED ORDER — LEUPROLIDE ACETATE 3.75 MG IM KIT: 3.7500 mg | PACK | Freq: Once | INTRAMUSCULAR | 0 refills | Status: AC

## 2023-03-28 NOTE — Progress Notes (Signed)
Sonya Garner is a 42 y.o. [phone] female here for a routine annual gynecologic exam.  Current complaints: HMC.    Pt reports a H/O [facility] for the last several months. Noted most recently after starting Equliis for H/O DVT and PE. Hospitalized in July with severe anemia related to her [facility] Received a short course of Megace and 11.5 IM injection of Depo Lupron.  Last Hgb @ 10.5 Cycle in August was still heavy but has only had some spotting this month GYN U/S several uterine fibroids, vol @ 311 gms  H/O TSVD x 4 ( largest @ 8# 6oz) H/O BTL    Gynecologic History Patient's last menstrual period was 03/23/2023. Contraception: tubal ligation Last Pap: Uncertain. Results were: normal Last mammogram: NA.   Obstetric History OB History  Gravida Para Term Preterm AB Living  4 4 4  0 0 4  SAB IAB Ectopic Multiple Live Births  0 0 0 0 4    # Outcome Date GA Lbr Len/2nd Weight Sex Type Anes PTL Lv  4 Term      Vag-Spont  N LIV  3 Term      Vag-Spont  N LIV  2 Term      Vag-Spont  N LIV  1 Term      Vag-Spont  N LIV    Past Medical History:  Diagnosis Date   Chronic kidney disease    kidney stone   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   Qualifier: Diagnosis of   By: Levada Schilling       Mild concussion 12/05/2016    Past Surgical History:  Procedure Laterality Date   TUBAL LIGATION      Current Outpatient Medications on File Prior to Visit  Medication Sig Dispense Refill   acetaminophen (TYLENOL) 500 MG tablet Take 500-1,000 mg by mouth as needed for moderate pain.     apixaban (ELIQUIS) 5 MG TABS tablet Take 1 tablet (5 mg total) by mouth 2 (two) times daily. 180 tablet 3   ferrous sulfate (FEROSUL) 325 (65 FE) MG tablet Take 1 tablet (325 mg total) by mouth daily for 30 days then as directed by MD 100 tablet 0   No current facility-administered medications on file prior to visit.    Allergies  Allergen Reactions   Feraheme [Ferumoxytol] Shortness Of Breath    Social History    Socioeconomic History   Marital status: Single    Spouse name: Not on file   Number of children: Not on file   Years of education: Not on file   Highest education level: Not on file  Occupational History   Not on file  Tobacco Use   Smoking status: Every Day    Current packs/day: 0.25    Average packs/day: 0.3 packs/day for 10.0 years (2.5 ttl pk-yrs)    Types: Cigarettes   Smokeless tobacco: Never   Tobacco comments:    Stopped 11/19/2022   Substance and Sexual Activity   Alcohol use: No   Drug use: No   Sexual activity: Yes    Birth control/protection: Surgical  Other Topics Concern   Not on file  Social History Narrative   Not on file   Social Determinants of Health   Financial Resource Strain: Not on file  Food Insecurity: No Food Insecurity (02/06/2023)   Hunger Vital Sign    Worried About Running Out of Food in the Last Year: Never true    Ran Out of Food in the Last Year: Never true  Transportation Needs: No Transportation Needs (02/06/2023)   PRAPARE - Administrator, Civil Service (Medical): No    Lack of Transportation (Non-Medical): No  Physical Activity: Not on file  Stress: Not on file  Social Connections: Moderately Isolated (11/26/2022)   Social Connection and Isolation Panel [NHANES]    Frequency of Communication with Friends and Family: More than three times a week    Frequency of Social Gatherings with Friends and Family: More than three times a week    Attends Religious Services: Never    Database administrator or Organizations: No    Attends Banker Meetings: Never    Marital Status: Living with partner  Intimate Partner Violence: Not At Risk (02/06/2023)   Humiliation, Afraid, Rape, and Kick questionnaire    Fear of Current or Ex-Partner: No    Emotionally Abused: No    Physically Abused: No    Sexually Abused: No    Family History  Problem Relation Age of Onset   Breast cancer Maternal Aunt    Cancer Maternal Aunt         breast   Cancer Maternal Uncle    Breast cancer Maternal Grandmother    Cancer Maternal Grandmother    Hearing loss Neg Hx     The following portions of the patient's history were reviewed and updated as appropriate: allergies, current medications, past family history, past medical history, past social history, past surgical history and problem list.  Review of Systems Pertinent items noted in HPI and remainder of comprehensive ROS otherwise negative.   Objective:  BP 116/78   Pulse (!) 104   Ht 5\' 4"  (1.626 m)   Wt 154 lb 4.8 oz (70 kg)   LMP 03/23/2023   BMI 26.49 kg/m  Chaperone present CONSTITUTIONAL: Well-developed, well-nourished female in no acute distress.  HENT:  Normocephalic, atraumatic, External right and left ear normal. Oropharynx is clear and moist EYES: Conjunctivae and EOM are normal. Pupils are equal, round, and reactive to light. No scleral icterus.  NECK: Normal range of motion, supple, no masses.  Normal thyroid.  SKIN: Skin is warm and dry. No rash noted. Not diaphoretic. No erythema. No pallor. NEUROLGIC: Alert and oriented to person, place, and time. Normal reflexes, muscle tone coordination. No cranial nerve deficit noted. PSYCHIATRIC: Normal mood and affect. Normal behavior. Normal judgment and thought content. CARDIOVASCULAR: Normal heart rate noted, regular rhythm RESPIRATORY: Clear to auscultation bilaterally. Effort and breath sounds normal, no problems with respiration noted. BREASTS: Deffered ABDOMEN: Soft, normal bowel sounds, no distention noted.  No tenderness, rebound or guarding.  PELVIC: Normal appearing external genitalia; normal appearing vaginal mucosa and cervix.  No abnormal discharge noted.  Pap smear obtained.  Uterine size @ 12 weeks, mobile, non tender, no other palpable masses, no uterine or adnexal tenderness. MUSCULOSKELETAL: Normal range of motion. No tenderness.  No cyanosis, clubbing, or edema.  2+ distal  pulses.   Assessment:  Annual gynecologic examination with pap smear [facility] Uterine fibroids H/O DVT/PE Plan:  Will follow up results of pap smear and manage accordingly.  Tx options reviewed with pt. Pt not a candidate for OCP's due to H/O DVT/PE To see PCP this month to determine length of anticoagulation. Ideally if need for any surgerical intervention, completion of anticoagulation would be good Discussed IUD, Sonota and TVH. Pt is interested in [facility]. Will refer to Dr Donavan Foil for evaluation. Will give an dose of Depo Lupron in Oct to provide bleeding management  until dicision for treatment is made Routine preventative health maintenance measures emphasized. Please refer to After Visit Summary for other counseling recommendations.    Hermina Staggers, MD, FACOG Attending Obstetrician & Gynecologist Center for [facility], [facility]

## 2023-03-29 LAB — CYTOLOGY - PAP
Adequacy: ABSENT
Comment: NEGATIVE
Diagnosis: NEGATIVE
High risk HPV: NEGATIVE

## 2023-04-01 ENCOUNTER — Other Ambulatory Visit: Payer: Self-pay

## 2023-04-01 DIAGNOSIS — I824Z2 Acute embolism and thrombosis of unspecified deep veins of left distal lower extremity: Secondary | ICD-10-CM

## 2023-04-01 MED ORDER — APIXABAN 5 MG PO TABS: 5.0000 mg | ORAL_TABLET | Freq: Two times a day (BID) | ORAL | 3 refills | Status: DC

## 2023-04-05 ENCOUNTER — Telehealth: Payer: Self-pay | Admitting: *Deleted

## 2023-04-05 NOTE — Telephone Encounter (Signed)
I called pt who stated she saw her GYN 9/12 for fibroids which causes her menstrual cycle to last a long pd of time . And they had discussed a possible partial hysterectomy. She was told  to discuss with her PCP about Eliquis. I asked pt if their office could fax over what is needed  if surgery is decided. Stated she has an up coming appt with a specialist and she will ask this office to fax what info is needed.

## 2023-04-05 NOTE — Telephone Encounter (Signed)
-----   Message -----From: Louretta Parma LSent: 03/29/2023  10:40 AM EDTTo: Imp Triage Nurse PoolSubject: FW: Appointment Request                      Please refer to message below.Thanks,[place]----- Message -----From: Sherron Flemings MSent: 03/29/2023   9:57 AM EDTTo: Binnie Kand Front Desk PoolSubject: Appointment Request                          Appointment Request From: Sonya Capra HerbinWith Provider: Rana Snare [facility]]Preferred Date Range: AnyPreferred Times: Any TimeReason for visit: Office VisitComments:The longevity of being on the eloquis, and how long can I go without taking them in preparation for surgery

## 2023-04-17 ENCOUNTER — Telehealth: Payer: Self-pay | Admitting: Lactation Services

## 2023-04-17 NOTE — Telephone Encounter (Signed)
PA for Depo Lupron faxed to [facility] via Cover my Meds. Awaiting determination.

## 2023-04-23 ENCOUNTER — Inpatient Hospital Stay (HOSPITAL_COMMUNITY)
Admission: EM | Admit: 2023-04-23 | Discharge: 2023-04-27 | DRG: 982 | Disposition: A | Discharge status: Home / Self Care | Payer: Managed Care, Other (non HMO) | Attending: Internal Medicine | Admitting: Internal Medicine

## 2023-04-23 ENCOUNTER — Emergency Department (HOSPITAL_COMMUNITY): Payer: Managed Care, Other (non HMO)

## 2023-04-23 ENCOUNTER — Emergency Department (HOSPITAL_BASED_OUTPATIENT_CLINIC_OR_DEPARTMENT_OTHER): Payer: Managed Care, Other (non HMO)

## 2023-04-23 ENCOUNTER — Encounter (HOSPITAL_COMMUNITY): Payer: Self-pay | Admitting: Internal Medicine

## 2023-04-23 ENCOUNTER — Other Ambulatory Visit: Payer: Self-pay

## 2023-04-23 DIAGNOSIS — N939 Abnormal uterine and vaginal bleeding, unspecified: Secondary | ICD-10-CM

## 2023-04-23 DIAGNOSIS — Z888 Allergy status to other drugs, medicaments and biological substances status: Secondary | ICD-10-CM

## 2023-04-23 DIAGNOSIS — Z86711 Personal history of pulmonary embolism: Secondary | ICD-10-CM

## 2023-04-23 DIAGNOSIS — Z7901 Long term (current) use of anticoagulants: Secondary | ICD-10-CM

## 2023-04-23 DIAGNOSIS — F17210 Nicotine dependence, cigarettes, uncomplicated: Secondary | ICD-10-CM | POA: Diagnosis present

## 2023-04-23 DIAGNOSIS — D259 Leiomyoma of uterus, unspecified: Secondary | ICD-10-CM

## 2023-04-23 DIAGNOSIS — R231 Pallor: Secondary | ICD-10-CM | POA: Diagnosis present

## 2023-04-23 DIAGNOSIS — D500 Iron deficiency anemia secondary to blood loss (chronic): Principal | ICD-10-CM

## 2023-04-23 DIAGNOSIS — N938 Other specified abnormal uterine and vaginal bleeding: Principal | ICD-10-CM | POA: Diagnosis present

## 2023-04-23 DIAGNOSIS — Z1152 Encounter for screening for COVID-19: Secondary | ICD-10-CM

## 2023-04-23 DIAGNOSIS — M7989 Other specified soft tissue disorders: Secondary | ICD-10-CM

## 2023-04-23 DIAGNOSIS — D250 Submucous leiomyoma of uterus: Secondary | ICD-10-CM | POA: Diagnosis present

## 2023-04-23 DIAGNOSIS — D62 Acute posthemorrhagic anemia: Principal | ICD-10-CM | POA: Diagnosis present

## 2023-04-23 DIAGNOSIS — N920 Excessive and frequent menstruation with regular cycle: Secondary | ICD-10-CM | POA: Diagnosis present

## 2023-04-23 DIAGNOSIS — D6832 Hemorrhagic disorder due to extrinsic circulating anticoagulants: Secondary | ICD-10-CM | POA: Diagnosis present

## 2023-04-23 DIAGNOSIS — T45515A Adverse effect of anticoagulants, initial encounter: Secondary | ICD-10-CM | POA: Diagnosis present

## 2023-04-23 DIAGNOSIS — D649 Anemia, unspecified: Secondary | ICD-10-CM | POA: Diagnosis present

## 2023-04-23 DIAGNOSIS — Z23 Encounter for immunization: Secondary | ICD-10-CM

## 2023-04-23 DIAGNOSIS — F17290 Nicotine dependence, other tobacco product, uncomplicated: Secondary | ICD-10-CM | POA: Diagnosis present

## 2023-04-23 DIAGNOSIS — D251 Intramural leiomyoma of uterus: Secondary | ICD-10-CM | POA: Diagnosis present

## 2023-04-23 DIAGNOSIS — Z86718 Personal history of other venous thrombosis and embolism: Secondary | ICD-10-CM

## 2023-04-23 DIAGNOSIS — Z9851 Tubal ligation status: Secondary | ICD-10-CM

## 2023-04-23 LAB — COMPREHENSIVE METABOLIC PANEL
ALT: 25 U/L (ref 0–44)
AST: 29 U/L (ref 15–41)
Albumin: 3.3 g/dL — ABNORMAL LOW (ref 3.5–5.0)
Alkaline Phosphatase: 32 U/L — ABNORMAL LOW (ref 38–126)
Anion gap: 13 (ref 5–15)
BUN: 8 mg/dL (ref 6–20)
CO2: 19 mmol/L — ABNORMAL LOW (ref 22–32)
Calcium: 8.4 mg/dL — ABNORMAL LOW (ref 8.9–10.3)
Chloride: 104 mmol/L (ref 98–111)
Creatinine, Ser: 0.8 mg/dL (ref 0.44–1.00)
GFR, Estimated: >60 mL/min (ref >60)
Glucose, Bld: 118 mg/dL — ABNORMAL HIGH (ref 70–99)
Potassium: 3.6 mmol/L (ref 3.5–5.1)
Sodium: 136 mmol/L (ref 135–145)
Total Bilirubin: 0.7 mg/dL (ref 0.3–1.2)
Total Protein: 6.1 g/dL — ABNORMAL LOW (ref 6.5–8.1)

## 2023-04-23 LAB — URINALYSIS, ROUTINE W REFLEX MICROSCOPIC

## 2023-04-23 LAB — CBC
HCT: 12.8 % — ABNORMAL LOW (ref 36.0–46.0)
Hemoglobin: 3.9 g/dL — CL (ref 12.0–15.0)
MCH: 23.8 pg — ABNORMAL LOW (ref 26.0–34.0)
MCHC: 30.5 g/dL (ref 30.0–36.0)
MCV: 78 fL — ABNORMAL LOW (ref 80.0–100.0)
Platelets: 251 10*3/uL (ref 150–400)
RBC: 1.64 MIL/uL — ABNORMAL LOW (ref 3.87–5.11)
RDW: 21.8 % — ABNORMAL HIGH (ref 11.5–15.5)
WBC: 8.5 10*3/uL (ref 4.0–10.5)
nRBC: 1.5 % — ABNORMAL HIGH (ref 0.0–0.2)

## 2023-04-23 LAB — SARS CORONAVIRUS 2 BY RT PCR: SARS Coronavirus 2 by RT PCR: NEGATIVE

## 2023-04-23 LAB — HCG, QUANTITATIVE, PREGNANCY: hCG, Beta Chain, Quant, S: <1 m[IU]/mL (ref <5)

## 2023-04-23 LAB — URINALYSIS, MICROSCOPIC (REFLEX): RBC / HPF: >50 RBC/hpf (ref 0–5)

## 2023-04-23 LAB — MAGNESIUM: Magnesium: 1.8 mg/dL (ref 1.7–2.4)

## 2023-04-23 LAB — PREPARE RBC (CROSSMATCH)

## 2023-04-23 LAB — TROPONIN I (HIGH SENSITIVITY)
Troponin I (High Sensitivity): 8 ng/L (ref <18)
Troponin I (High Sensitivity): 8 ng/L (ref <18)

## 2023-04-23 MED ORDER — SENNOSIDES-DOCUSATE SODIUM 8.6-50 MG PO TABS: 1.0000 | ORAL_TABLET | Freq: Every evening | ORAL | Status: DC | PRN

## 2023-04-23 MED ORDER — ACETAMINOPHEN 500 MG PO TABS
1000.0000 mg | ORAL_TABLET | Freq: Once | ORAL | Status: AC
  Administered 2023-04-23: 1000 mg via ORAL
  Filled 2023-04-23: qty 2

## 2023-04-23 MED ORDER — MEGESTROL ACETATE 40 MG PO TABS
40.0000 mg | ORAL_TABLET | Freq: Two times a day (BID) | ORAL | Status: DC
  Administered 2023-04-23 – 2023-04-27 (×9): 40 mg via ORAL
  Filled 2023-04-23 (×12): qty 1

## 2023-04-23 MED ORDER — ACETAMINOPHEN 325 MG PO TABS: 650.0000 mg | ORAL_TABLET | Freq: Four times a day (QID) | ORAL | Status: DC | PRN

## 2023-04-23 MED ORDER — ONDANSETRON HCL 4 MG/2ML IJ SOLN
4.0000 mg | Freq: Once | INTRAMUSCULAR | Status: AC
  Administered 2023-04-23: 4 mg via INTRAVENOUS
  Filled 2023-04-23: qty 2

## 2023-04-23 MED ORDER — INFLUENZA VIRUS VACC SPLIT PF (FLUZONE) 0.5 ML IM SUSY
0.5000 mL | PREFILLED_SYRINGE | INTRAMUSCULAR | Status: DC
  Filled 2023-04-23: qty 0.5

## 2023-04-23 MED ORDER — ACETAMINOPHEN 650 MG RE SUPP: 650.0000 mg | Freq: Four times a day (QID) | RECTAL | Status: DC | PRN

## 2023-04-23 MED ORDER — IOHEXOL 350 MG/ML SOLN
75.0000 mL | Freq: Once | INTRAVENOUS | Status: AC | PRN
  Administered 2023-04-23: 75 mL via INTRAVENOUS

## 2023-04-23 MED ORDER — SODIUM CHLORIDE 0.9% IV SOLUTION: Freq: Once | INTRAVENOUS | Status: AC

## 2023-04-23 NOTE — ED Notes (Signed)
This RN witnessed consent for blood transfusion, electronically signed.

## 2023-04-23 NOTE — Progress Notes (Signed)
Lower extremity venous duplex completed. Please see CV Procedures for preliminary results.  Initial findings reported to Vivi Barrack, MD. Shona Simpson, RVT 04/23/23 10:12 AM

## 2023-04-23 NOTE — ED Triage Notes (Signed)
EMS stated, pt coming from home with SOB , this started yesterday. Pt. Worse with exertion . Has a hx of PE. Last visit had blood transfusion , this cycle has been bleeding since Sept. 15.  Pt. Takes Elaquis, so waiting to see how long she has to be off before having a hysterectomy. Pt. Stated, in May I had a blood clot in left leg and both lungs 20 g in left [facility]

## 2023-04-23 NOTE — ED Notes (Signed)
Lab requesting Hemoglobin to redraw

## 2023-04-23 NOTE — H&P (Cosign Needed Addendum)
Date: 04/23/2023               Patient Name:  Sonya Garner MRN: [id number]  DOB: 1981/01/16 Age / Sex: 42 y.o., female   PCP: Rana Snare, DO              Medical Service: Internal Medicine Teaching Service              Attending Physician: Dr. Reymundo Poll, MD    First Contact: Cecilio Asper, MS IV Pager: [phone]  Second Contact: Dr. Rana Snare Pager: [phone]            After Hours (After 5p/  First Contact Pager: [phone]  weekends / holidays): Second Contact Pager: [phone]   Chief Complaint: Shortness of breath  History of Present Illness:   Sonya Garner is a 42 y.o. female with history of PE on Eliquis, uterine fibroids, and multiple hospitalizations for AUB who presents for 4 days of shortness of breath, dizziness, and headache in the setting of a current heavy menstrual period. Patient noted she was getting more short of breath with minimal exertion last Friday. She notes having trouble making it from her bedroom to the bathroom without getting winded. During this exertion she also noticed she would get dizzy. Shortness of breath and dizziness would resolve with rest. She denies chest pain, palpitations. She denies any unilateral swelling, pain, or redness in her legs. They will swell if she is sitting for long periods of time, usually L>R but this has not changed. She mentions a mild dry cough, but no sore throat or runny nose. She also reports a pounding headache above her eyes that started around the same time. Nothing has made it better or worse. It does not feel similar to her normal headaches, which she does not have frequently to begin with. No associated visual or hearing changes, weakness, or sensory changes. Patient further notes she vomited last night and this morning. Vomit was clear. She denies abdominal pain, nausea, or difficulty tolerating PO. She is having menstrual cramps, which usually start in the first few days of her period, but resolve. No  bloody, black, or sticky stool. No diarrhea. No change in urine color or frequency. However, patient notes she is currently on her period so it is difficult to tell.   Patient's current period started at the end of September. Since May, when she started eliquis, she notes her periods have last >3weeks at a time with heavy bleeding throughout. When getting the depo shots, the bleeding was lighter for about a week, but would become heavy shortly after. Blood is dark red with large clots. She uses heavy flow pads and goes through a large pack of ~30 in a month. Currently, she is changing her pad every hour. She did see the Obgyn in September and they discussed proceeding with a hysterectomy, however they were waiting to schedule it so they could do an eliquis wash out. Patient mentions she was meant to have a repeat leuprolide shot later this month and she thinks her last one was in August.   ED course: On arrival, BP 116/60, HR 102, O2 100% on room air. EKG with sinus tachycardia. Hgb 3.9. Last checked one month ago and was 10.2. 2 units of PRBC ordered. ED provider spoke with ob/gyn and they are not inclined to intervene at this time, would prefer stabilization for outpatient hysterectomy. They recommended Megace 40mg  BID.    Meds:  Eliquis 5mg  BID Ferrous sulfate  325mg  daily Current Meds  Medication Sig   acetaminophen (TYLENOL) 500 MG tablet Take 500-1,000 mg by mouth as needed for moderate pain.   apixaban (ELIQUIS) 5 MG TABS tablet Take 1 tablet (5 mg total) by mouth 2 (two) times daily.   ferrous sulfate (FEROSUL) 325 (65 FE) MG tablet Take 1 tablet (325 mg total) by mouth daily for 30 days then as directed by MD (Patient taking differently: Take 325 mg by mouth 2 (two) times daily with a meal.)   Allergies: Allergies as of 04/23/2023 - Review Complete 04/23/2023  Allergen Reaction Noted   Feraheme [ferumoxytol] Shortness Of Breath 02/07/2023   Past Medical History:  Diagnosis Date    Chronic kidney disease    kidney stone   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   Qualifier: Diagnosis of   By: Levada Schilling       Mild concussion 12/05/2016   Past Surgical History:  Procedure Laterality Date   TUBAL LIGATION     Family History:  Breast cancer in maternal aunt and grandmother  Social History:  Home - lives with husband and 17yo son Occupation - works in TEFL teacher job Function - independent for all ADLs/iADLs, has had to decrease activity due to symptoms Tobacco - no cigarettes, vapes every other day a few times a day Alcohol - denies Drugs - denies PCP - Dr. Rana Snare  Review of Systems: A complete ROS was negative except as per HPI.   Physical Exam: Blood pressure 117/67, pulse 94, temperature 98.7 F (37.1 C), resp. rate 17, height 5\' 4"  (1.626 m), weight 69.9 kg, last menstrual period 03/31/2023, SpO2 100%.  Physical Examination: General appearance - alert, well appearing, and in no distress Mental status - alert, oriented to person, place, and time Eyes - pupils equal and reactive, extraocular eye movements intact, conjunctival pallor bilaterally Mouth - mucous membranes moist, pharynx normal without lesions and palatal pallor Chest - clear to auscultation, no wheezes, rales or rhonchi, symmetric air entry Heart - normal rate, regular rhythm, normal S1, S2, no murmurs, rubs, clicks or gallops Abdomen - soft, nontender, nondistended, no masses or organomegaly Musculoskeletal - no joint tenderness, deformity or swelling Extremities - peripheral pulses normal, no pedal edema, no clubbing or cyanosis   Imaging: CT Angio Chest PE W and/or Wo Contrast FINDINGS: Cardiovascular: Heart is nonenlarged. No pericardial effusion. The thoracic aorta has a normal course and caliber. There is breathing motion throughout the examination significantly limited evaluation for pulmonary emboli, nondiagnostic for small and peripheral emboli. No large or central embolus.  The previous central emboli are no longer identified.   Mediastinum/Nodes: Triangular-shaped soft tissue in the anterior superior mediastinum consistent with some residual thymus tissue. No separate abnormal lymph node enlargement identified in the axillary region, hilum or mediastinum. Normal caliber thoracic esophagus. Preserved thyroid gland.   Lungs/Pleura: Lungs are clear. No pleural effusion or pneumothorax.   Upper Abdomen: No acute abnormality.   Musculoskeletal: Curvature of the spine.   Review of the MIP images confirms the above findings.  IMPRESSION: Previous pulmonary emboli are no longer clearly identified. No new emboli identified although there is significant breathing motion limited evaluation. Nondiagnostic for small and peripheral emboli.  VAS [place] LOWER EXTREMITY VENOUS (DVT) (7a-7p) RIGHT: - No evidence of common femoral vein obstruction. LEFT: - There is recanalized thrombus in the left popliteal vein(s). There is recanalized thrombus in the left popliteal vein(s). - No cystic structure found in the popliteal fossa.  (Preliminary)  DG Chest 1  View FINDINGS: The heart size and mediastinal contours are within normal limits. Both lungs are clear. The visualized skeletal structures are unremarkable except for similar slight dextroscoliosis of the spine. Lower chest nipple shadows noted.  IMPRESSION: No active disease.   EKG: personally reviewed my interpretation is sinus tachycardia, HR 115, no axis deviation  Assessment & Plan by Problem: Principal Problem:   Symptomatic anemia  Symptomatic Anemia Fe deficiency anemia Patient with history of exertional shortness of breath, dizziness, and headache in the setting of persistent heavy menstrual cycle. Changing pad hourly. Known uterine fibroids with plan for hysterectomy for fibroid bleeding control. Patient has pale conjunctiva and oral mucosa on exam, but is otherwise well appearing in no distress.  HDS. Hgb 3.9 on arrival. Last checked one month ago and it was 10. Patient given 2 units PRBC in the ED. 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 No DVT signs or symptoms. ED provider spoke with obgyn, who do not recommend emergent intervention. They recommended megace 40mg  BID and stabilization for outpatient hysterectomy evaluation.  PLAN: - Monitor post-transfusion H/H - Transfuse as needed for hgb <7 - Hold eliquis  - Continue megace 40mg  BID - Add on iron studies to prior collection - Patient will need to continue follow up with OBGYN outpatient  Chronic: - Hx of Bilateral PE - holding home eliquis due to hgb 3.9  Diet: Normal IVF: None, VTE: SCDs Code: Full  Dispo: Admit patient to Observation with expected length of stay less than 2 midnights.  Signed: Dorita Sciara, Medical Student 04/23/2023, 1:20 PM    Attestation for Student Documentation:  I personally was present and re-performed the history, physical exam and medical decision-making activities of this service and have verified that the service and findings are accurately documented in the student's note.  Rana Snare, DO 04/23/2023, 2:56 PM

## 2023-04-23 NOTE — ED Provider Triage Note (Signed)
Emergency Medicine Provider Triage Evaluation Note  Sonya Garner , a 42 y.o. female H/o CKD, DVT and PE on Eliquis  was evaluated in triage.  Pt complains of SOB which started yesterday. +DOE. H/o PE. Has also had heavy vaginal bleeding and takes eliquis. In July required a blood transfusion for vaginal bleeding, hgb nadir 7.1.  Had similar presentation on 03/07/23 for CP/SOB/VB. Hgb then was 10.2. In May had BL PEs, And the symptoms today feel worse than before. Has been compliant on eliquis, hasn't missed any doses. Endorses minor left leg swelling when she is at work.  Review of Systems  Positive: Chills, +cough, dizzy/lightheaded Negative: Denies chest pain, abd pain, urinary sxs  Physical Exam  Ht 5\' 4"  (1.626 m)   Wt 69.9 kg   LMP 03/31/2023 Comment: has been bleeding continuous  BMI 26.43 kg/m  Gen:   Awake, no distress   Resp:  Tachypneic, very mildly increased WOB MSK:   Moves extremities without difficulty. No leg swelling.  Medical Decision Making  Medically screening exam initiated at 8:24 AM.   Pt with h/o PE/DVT now on eliquis presents w/ SOB, intermittent L leg swelling. Will order imaging and labs and ASHONTE ANGELUCCI was informed that the remainder of the evaluation will be completed by another provider, this initial triage assessment does not replace that evaluation, and the importance of remaining in the ED until their evaluation is complete.   Loetta Rough, MD 04/23/23 [phone]

## 2023-04-23 NOTE — ED Provider Notes (Signed)
Clinical Course as of 04/23/23 1101  Tue Apr 23, 2023  0945 Comprehensive metabolic panel(!) Unremarkable in the context of this patient's presentation  [HN]  0945 HCG, Beta Chain, Quant, S: <1 neg [HN]  0950 EKG w/ new TWIs and ST depressions in inferolateral leads, increased when compared with prior EKG in 2022 [HN]  1043 VAS [place] LOWER EXTREMITY VENOUS (DVT) (7a-7p) Summary: RIGHT: - No evidence of common femoral vein obstruction.        LEFT:   - There is recanalized thrombus in the left popliteal vein(s). There is recanalized thrombus in the left popliteal vein(s).  - No cystic structure found in the popliteal fossa.    Improved from study in May [HN]    Clinical Course User Index [HN] Loetta Rough, MD      Loetta Rough, MD 04/23/23 [phone]

## 2023-04-23 NOTE — ED Provider Notes (Addendum)
[place] EMERGENCY DEPARTMENT AT [facility] Provider Note   CSN: [id number] Arrival date & time: 04/23/23  3329     History  Chief Complaint  Patient presents with   Headache   Shortness of Breath   Weakness    Sonya Garner is a 42 y.o. female.  41 y.o. female presenting with worsening SOB ongoing for 2 weeks which acutely worsened in the last 48 hours.  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  Since being on an anticoagulant she has had extremely heavy uterine bleeding secondary to known uterine fibroids.  She has been evaluated by OB/GYN who have recommended surgical intervention outpatient.  Previously patient was unsure about pursuing this option and was looking into Depo.  She reports she received her last Depo shot around August.  Since late September she reports having very heavy bleeding where she has been filling a maxi pad close to every hour.  She was evaluated for shortness of breath on 03/07/2023 where her hemoglobin was 10.2.  She denies hematuria, hemoptysis, hematochezia.  The history is provided by the patient. No language interpreter was used.  Headache Associated symptoms: fatigue and weakness   Associated symptoms: no diarrhea and no fever   Shortness of Breath Associated symptoms: diaphoresis and headaches   Associated symptoms: no chest pain and no fever   Weakness Associated symptoms: headaches and shortness of breath   Associated symptoms: no chest pain, no diarrhea and no fever        Home Medications Prior to Admission medications   Medication Sig Start Date End Date Taking? Authorizing Provider  acetaminophen (TYLENOL) 500 MG tablet Take 500-1,000 mg by mouth as needed for moderate pain.    [provider]  apixaban (ELIQUIS) 5 MG TABS tablet Take 1 tablet (5 mg total) by mouth 2 (two) times daily. 04/01/23   Rana Snare, DO  ferrous sulfate (FEROSUL) 325 (65 FE) MG tablet Take 1  tablet (325 mg total) by mouth daily for 30 days then as directed by MD 03/22/23   Chauncey Mann, DO      Allergies    Feraheme [ferumoxytol]    Review of Systems   Review of Systems  Constitutional:  Positive for activity change, diaphoresis and fatigue. Negative for fever.  Respiratory:  Positive for shortness of breath. Negative for apnea and chest tightness.   Cardiovascular:  Negative for chest pain.  Gastrointestinal:  Negative for blood in stool and diarrhea.  Genitourinary:  Positive for vaginal bleeding. Negative for hematuria.  Neurological:  Positive for weakness and headaches.    Physical Exam Updated Vital Signs BP 114/60   Pulse 98   Temp 98.5 F (36.9 C) (Axillary)   Resp 18   Ht 5\' 4"  (1.626 m)   Wt 69.9 kg   LMP 03/31/2023 Comment: has been bleeding continuous  SpO2 100%   BMI 26.43 kg/m  Physical Exam Constitutional:      Appearance: She is well-developed. She is ill-appearing.  HENT:     Head: Normocephalic.  Cardiovascular:     Rate and Rhythm: Normal rate and regular rhythm.     Heart sounds: Normal heart sounds.  Pulmonary:     Effort: Pulmonary effort is normal.     Breath sounds: Normal breath sounds.  Abdominal:     General: Bowel sounds are normal.     Palpations: Abdomen is soft.  Musculoskeletal:  General: Normal range of motion.  Skin:    General: Skin is warm.     Capillary Refill: Capillary refill takes 2 to 3 seconds.     Coloration: Skin is pale.  Neurological:     Mental Status: She is oriented to person, place, and time. Mental status is at baseline. She is lethargic.     GCS: GCS eye subscore is 4. GCS verbal subscore is 5. GCS motor subscore is 6.     ED Results / Procedures / Treatments   Labs (all labs ordered are listed, but only abnormal results are displayed) Labs Reviewed  URINALYSIS, ROUTINE W REFLEX MICROSCOPIC - Abnormal; Notable for the following components:      Result Value   Color, Urine RED (*)     APPearance TURBID (*)    Glucose, UA   (*)    Value: TEST NOT REPORTED DUE TO COLOR INTERFERENCE OF URINE PIGMENT   Hgb urine dipstick   (*)    Value: TEST NOT REPORTED DUE TO COLOR INTERFERENCE OF URINE PIGMENT   Bilirubin Urine   (*)    Value: TEST NOT REPORTED DUE TO COLOR INTERFERENCE OF URINE PIGMENT   Ketones, ur   (*)    Value: TEST NOT REPORTED DUE TO COLOR INTERFERENCE OF URINE PIGMENT   Protein, ur   (*)    Value: TEST NOT REPORTED DUE TO COLOR INTERFERENCE OF URINE PIGMENT   Nitrite   (*)    Value: TEST NOT REPORTED DUE TO COLOR INTERFERENCE OF URINE PIGMENT   Leukocytes,Ua   (*)    Value: TEST NOT REPORTED DUE TO COLOR INTERFERENCE OF URINE PIGMENT   All other components within normal limits  COMPREHENSIVE METABOLIC PANEL - Abnormal; Notable for the following components:   CO2 19 (*)    Glucose, Bld 118 (*)    Calcium 8.4 (*)    Total Protein 6.1 (*)    Albumin 3.3 (*)    Alkaline Phosphatase 32 (*)    All other components within normal limits  CBC - Abnormal; Notable for the following components:   RBC 1.64 (*)    Hemoglobin 3.9 (*)    HCT 12.8 (*)    MCV 78.0 (*)    MCH 23.8 (*)    RDW 21.8 (*)    nRBC 1.5 (*)    All other components within normal limits  URINALYSIS, MICROSCOPIC (REFLEX) - Abnormal; Notable for the following components:   Bacteria, UA RARE (*)    All other components within normal limits  SARS CORONAVIRUS 2 BY RT PCR  HCG, QUANTITATIVE, PREGNANCY  MAGNESIUM  CBG MONITORING, ED  TYPE AND SCREEN  PREPARE RBC (CROSSMATCH)  TROPONIN I (HIGH SENSITIVITY)  TROPONIN I (HIGH SENSITIVITY)    EKG EKG Interpretation Date/Time:  Tuesday April 23 2023 07:47:00 EDT Ventricular Rate:  115 PR Interval:  116 QRS Duration:  86 QT Interval:  352 QTC Calculation: 486 R Axis:   73  Text Interpretation: Sinus tachycardia New TWIs and STDs in inferior and lateral leads Increased when compared to 07-Mar-2023 Confirmed by Vivi Barrack [phone]) on  04/23/2023 9:50:15 AM  Radiology CT Angio Chest PE W and/or Wo Contrast  Result Date: 04/23/2023 CLINICAL DATA:  Shortness of breath EXAM: CT ANGIOGRAPHY CHEST WITH CONTRAST TECHNIQUE: Multidetector CT imaging of the chest was performed using the standard protocol during bolus administration of intravenous contrast. Multiplanar CT image reconstructions and MIPs were obtained to evaluate the vascular anatomy. RADIATION DOSE REDUCTION: This  exam was performed according to the departmental dose-optimization program which includes automated exposure control, adjustment of the mA and/or kV according to patient size and/or use of iterative reconstruction technique. CONTRAST:  75mL OMNIPAQUE IOHEXOL 350 MG/ML SOLN COMPARISON:  CT angiogram chest 11/20/2022 FINDINGS: Cardiovascular: Heart is nonenlarged. No pericardial effusion. The thoracic aorta has a normal course and caliber. There is breathing motion throughout the examination significantly limited evaluation for pulmonary emboli, nondiagnostic for small and peripheral emboli. No large or central embolus. The previous central emboli are no longer identified. Mediastinum/Nodes: Triangular-shaped soft tissue in the anterior superior mediastinum consistent with some residual thymus tissue. No separate abnormal lymph node enlargement identified in the axillary region, hilum or mediastinum. Normal caliber thoracic esophagus. Preserved thyroid gland. Lungs/Pleura: Lungs are clear. No pleural effusion or pneumothorax. Upper Abdomen: No acute abnormality. Musculoskeletal: Curvature of the spine. Review of the MIP images confirms the above findings. IMPRESSION: Previous pulmonary emboli are no longer clearly identified. No new emboli identified although there is significant breathing motion limited evaluation. Nondiagnostic for small and peripheral emboli. Electronically Signed   By: Karen Kays M.D.   On: 04/23/2023 11:55   VAS [place] LOWER EXTREMITY VENOUS (DVT)  (7a-7p)  Result Date: 04/23/2023  Lower Venous DVT Study Patient Name:  TAYTON DUFFIN  Date of Exam:   04/23/2023 Medical Rec #: [id number]         Accession #:    [id number] Date of Birth: [birth year]         Patient Gender: F Patient Age:   21 years Exam Location:  [facility] Procedure:      VAS [place] LOWER EXTREMITY VENOUS (DVT) Referring Phys: HAYLEY NAASZ --------------------------------------------------------------------------------  Indications: Edema, and Swelling.  Risk Factors: DVT Left leg 5/24. Anticoagulation: Eliquis. Comparison Study: Improved findings since previous exam 11/20/22 Performing Technologist: Shona Simpson  Examination Guidelines: A complete evaluation includes B-mode imaging, spectral Doppler, color Doppler, and power Doppler as needed of all accessible portions of each vessel. Bilateral testing is considered an integral part of a complete examination. Limited examinations for reoccurring indications may be performed as noted. The reflux portion of the exam is performed with the patient in reverse Trendelenburg.  +-----+---------------+---------+-----------+----------+--------------+ RIGHTCompressibilityPhasicitySpontaneityPropertiesThrombus Aging +-----+---------------+---------+-----------+----------+--------------+ CFV  Full           Yes      Yes                                 +-----+---------------+---------+-----------+----------+--------------+   +---------+---------------+---------+-----------+----------------+-------------+ LEFT     CompressibilityPhasicitySpontaneityProperties      Thrombus                                                                  Aging         +---------+---------------+---------+-----------+----------------+-------------+ CFV      Full           Yes      Yes                        Chronic       +---------+---------------+---------+-----------+----------------+-------------+ SFJ      Full                                                              +---------+---------------+---------+-----------+----------------+-------------+  FV Prox  Full                                                             +---------+---------------+---------+-----------+----------------+-------------+ FV Mid   Full                                                             +---------+---------------+---------+-----------+----------------+-------------+ FV DistalFull                                                             +---------+---------------+---------+-----------+----------------+-------------+ PFV      Full                                                             +---------+---------------+---------+-----------+----------------+-------------+ POP      Partial        Yes      Yes        spongy          Chronic                                                   w/compression                 +---------+---------------+---------+-----------+----------------+-------------+ PTV      Full                                                             +---------+---------------+---------+-----------+----------------+-------------+ PERO     Full                                                             +---------+---------------+---------+-----------+----------------+-------------+ Soleal   Full                                                             +---------+---------------+---------+-----------+----------------+-------------+ Gastroc  Partial                            softly  echogenicChronic       +---------+---------------+---------+-----------+----------------+-------------+    Summary: RIGHT: - No evidence of common femoral vein obstruction.   LEFT: - There is recanalized thrombus in the left popliteal vein(s). There is recanalized thrombus in the left popliteal vein(s). - No cystic structure found in the popliteal fossa.  *See table(s) above for  measurements and observations.    Preliminary    DG Chest 1 View  Result Date: 04/23/2023 CLINICAL DATA:  Worsening shortness of breath EXAM: CHEST  1 VIEW COMPARISON:  03/07/2023 FINDINGS: The heart size and mediastinal contours are within normal limits. Both lungs are clear. The visualized skeletal structures are unremarkable except for similar slight dextroscoliosis of the spine. Lower chest nipple shadows noted. IMPRESSION: No active disease. Electronically Signed   By: Judie Petit.  Shick M.D.   On: 04/23/2023 09:10    Procedures Procedures    Medications Ordered in ED Medications  megestrol (MEGACE) tablet 40 mg (has no administration in time range)  0.9 %  sodium chloride infusion (Manually program via Guardrails IV Fluids) (has no administration in time range)  ondansetron (ZOFRAN) injection 4 mg (4 mg Intravenous Given 04/23/23 0914)  acetaminophen (TYLENOL) tablet 1,000 mg (1,000 mg Oral Given 04/23/23 1303)  iohexol (OMNIPAQUE) 350 MG/ML injection 75 mL (75 mLs Intravenous Contrast Given 04/23/23 1055)    ED Course/ Medical Decision Making/ A&P Clinical Course as of 04/23/23 1349  Tue Apr 23, 2023  0945 Comprehensive metabolic panel(!) Unremarkable in the context of this patient's presentation  [HN]  0945 HCG, Beta Chain, Quant, S: <1 neg [HN]  0950 EKG w/ new TWIs and ST depressions in inferolateral leads, increased when compared with prior EKG in 2022 [HN]  1043 VAS [place] LOWER EXTREMITY VENOUS (DVT) (7a-7p) Summary: RIGHT: - No evidence of common femoral vein obstruction.        LEFT:   - There is recanalized thrombus in the left popliteal vein(s). There is recanalized thrombus in the left popliteal vein(s).  - No cystic structure found in the popliteal fossa.    Improved from study in May [HN]    Clinical Course User Index [HN] Loetta Rough, MD                                 Medical Decision Making 42 y.o. female presenting with worsening shortness of breath.   On arrival vital signs were stable.  On physical exam she is lethargic with increased work of breathing satting well on room air but using oxygen for comfort.  Lab significant for hemoglobin 3.9 which has down trended from 1 month ago at 10.2.  Anemia is secondary to severe vaginal bleeding d/t anticoagulation for known PE.  Patient consented and will transfuse 2 units RBCs in the ED.  CMP with stable electrolytes and kidney function.  Ultrasound of lower extremity showing resolving left lower extremity DVT.  Chest x-ray negative for acute cardiopulmonary abnormality.  Urine hCG negative.  UA unable to be properly interpreted due to concentration of hemoglobin.  Spoke with on-call OB/GYN Dr. Ladean Raya who recommended 40 mg of Megace twice daily.  She recommended stabilization of hemoglobin in the hospital with GYN intervention outpatient.  Due to the patient having continued vaginal bleeding and being symptomatic will admit to internal medicine service for monitoring of hemoglobin, vaginal bleeding and discussion of anticoagulation.  Amount and/or Complexity of Data Reviewed Labs: ordered. Decision-making details documented in ED Course.  Radiology: ordered. Decision-making details documented in ED Course.  Risk OTC drugs. Prescription drug management. Decision regarding hospitalization.          Final Clinical Impression(s) / ED Diagnoses Final diagnoses:  Anemia due to chronic blood loss    Rx / DC Orders ED Discharge Orders     None         Glendale Chard, DO 04/23/23 1342    [place]ale Chard, DO 04/23/23 1349    Jacalyn Lefevre, MD 04/23/23 1400

## 2023-04-24 ENCOUNTER — Telehealth: Payer: Self-pay | Admitting: Lactation Services

## 2023-04-24 ENCOUNTER — Other Ambulatory Visit (HOSPITAL_COMMUNITY): Payer: Self-pay

## 2023-04-24 DIAGNOSIS — D259 Leiomyoma of uterus, unspecified: Secondary | ICD-10-CM | POA: Diagnosis not present

## 2023-04-24 DIAGNOSIS — D62 Acute posthemorrhagic anemia: Secondary | ICD-10-CM

## 2023-04-24 DIAGNOSIS — D649 Anemia, unspecified: Secondary | ICD-10-CM

## 2023-04-24 DIAGNOSIS — D509 Iron deficiency anemia, unspecified: Secondary | ICD-10-CM | POA: Diagnosis not present

## 2023-04-24 LAB — CBC
HCT: 21.6 % — ABNORMAL LOW (ref 36.0–46.0)
Hemoglobin: 7.2 g/dL — ABNORMAL LOW (ref 12.0–15.0)
MCH: 26.8 pg (ref 26.0–34.0)
MCHC: 33.3 g/dL (ref 30.0–36.0)
MCV: 80.3 fL (ref 80.0–100.0)
Platelets: 212 10*3/uL (ref 150–400)
RBC: 2.69 MIL/uL — ABNORMAL LOW (ref 3.87–5.11)
RDW: 18.9 % — ABNORMAL HIGH (ref 11.5–15.5)
WBC: 6.6 10*3/uL (ref 4.0–10.5)
nRBC: 1.5 % — ABNORMAL HIGH (ref 0.0–0.2)

## 2023-04-24 LAB — BASIC METABOLIC PANEL
Anion gap: 9 (ref 5–15)
BUN: 10 mg/dL (ref 6–20)
CO2: 21 mmol/L — ABNORMAL LOW (ref 22–32)
Calcium: 8.3 mg/dL — ABNORMAL LOW (ref 8.9–10.3)
Chloride: 107 mmol/L (ref 98–111)
Creatinine, Ser: 0.67 mg/dL (ref 0.44–1.00)
GFR, Estimated: >60 mL/min (ref >60)
Glucose, Bld: 101 mg/dL — ABNORMAL HIGH (ref 70–99)
Potassium: 3.7 mmol/L (ref 3.5–5.1)
Sodium: 137 mmol/L (ref 135–145)

## 2023-04-24 LAB — IRON AND TIBC
Iron: 18 ug/dL — ABNORMAL LOW (ref 28–170)
Saturation Ratios: 4 % — ABNORMAL LOW (ref 10.4–31.8)
TIBC: 468 ug/dL — ABNORMAL HIGH (ref 250–450)
UIBC: 450 ug/dL

## 2023-04-24 LAB — HEMOGLOBIN AND HEMATOCRIT, BLOOD
HCT: 20.3 % — ABNORMAL LOW (ref 36.0–46.0)
HCT: 23.4 % — ABNORMAL LOW (ref 36.0–46.0)
Hemoglobin: 6.7 g/dL — CL (ref 12.0–15.0)
Hemoglobin: 7.5 g/dL — ABNORMAL LOW (ref 12.0–15.0)

## 2023-04-24 LAB — FERRITIN: Ferritin: 4 ng/mL — ABNORMAL LOW (ref 11–307)

## 2023-04-24 MED ORDER — INFLUENZA VIRUS VACC SPLIT PF (FLUZONE) 0.5 ML IM SUSY
0.5000 mL | PREFILLED_SYRINGE | INTRAMUSCULAR | Status: AC
  Administered 2023-04-25: 0.5 mL via INTRAMUSCULAR
  Filled 2023-04-24: qty 0.5

## 2023-04-24 MED ORDER — ALBUTEROL SULFATE (2.5 MG/3ML) 0.083% IN NEBU: 2.5000 mg | INHALATION_SOLUTION | Freq: Once | RESPIRATORY_TRACT | Status: DC | PRN

## 2023-04-24 MED ORDER — METHYLPREDNISOLONE SODIUM SUCC 125 MG IJ SOLR
125.0000 mg | Freq: Once | INTRAMUSCULAR | Status: AC
  Administered 2023-04-24: 125 mg via INTRAVENOUS
  Filled 2023-04-24: qty 2

## 2023-04-24 MED ORDER — EPINEPHRINE PF 1 MG/ML IJ SOLN: 0.3000 mg | Freq: Once | INTRAMUSCULAR | Status: DC | PRN

## 2023-04-24 MED ORDER — SODIUM CHLORIDE 0.9 % IV SOLN
500.0000 mg | Freq: Once | INTRAVENOUS | Status: AC
  Administered 2023-04-24: 500 mg via INTRAVENOUS
  Filled 2023-04-24: qty 25

## 2023-04-24 MED ORDER — FAMOTIDINE IN NACL 20-0.9 MG/50ML-% IV SOLN
20.0000 mg | Freq: Once | INTRAVENOUS | Status: AC
  Administered 2023-04-24: 20 mg via INTRAVENOUS
  Filled 2023-04-24: qty 50

## 2023-04-24 MED ORDER — IRON SUCROSE 500 MG IVPB - SIMPLE MED
500.0000 mg | Freq: Once | INTRAVENOUS | Status: DC
  Filled 2023-04-24 (×3): qty 275

## 2023-04-24 MED ORDER — DIPHENHYDRAMINE HCL 50 MG/ML IJ SOLN
25.0000 mg | Freq: Once | INTRAMUSCULAR | Status: AC
  Administered 2023-04-24: 25 mg via INTRAVENOUS
  Filled 2023-04-24: qty 1

## 2023-04-24 MED ORDER — LEUPROLIDE ACETATE 3.75 MG IM KIT
3.7500 mg | PACK | Freq: Once | INTRAMUSCULAR | 0 refills | Status: AC
  Filled 2023-04-24: qty 1, 30d supply, fill #0

## 2023-04-24 MED ORDER — SODIUM CHLORIDE 0.9 % IV SOLN
500.0000 mg | Freq: Once | INTRAVENOUS | Status: DC
  Filled 2023-04-24: qty 25

## 2023-04-24 NOTE — Consult Note (Signed)
Reason for Consult:Abnormal uterine bleeding   Sonya Garner is an 42 y.o. female P4 admitted with symptomatic anemia secondary to AUB and known fibroid uterus. Patient reports heavy vaginal bleeding since late September describing the need to change a pad every hour. She states that over the past 2 days she saturated a pad every 30-45 minutes and reported fatigue, generalized weakness, headache and shortness of breath. 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 Patient was previous seen outpatient for management of AUB and opted for medical management with depo-lupron which she received in August. Patient was admitted overnight for blood transfusion and medical management of AUB with Megace. Patient reports feeling much improved in comparison to when she first presented to the ED. Megace was started yesterday afternoon and she reports some improvement in her flow. She describes it as being heavy but only changed 3 pads overnight which is a significant improvement from prior. Patient is currently asymptomatic following blood transfusion   Pertinent Gynecological History: Bleeding: dysfunctional uterine bleeding Contraception: tubal ligation Blood transfusions:  yes previous admission Last mammogram: normal Date: 12/2022 Last pap: normal Date: 03/2023 OB History: G4, P4   Menstrual History: Patient's last menstrual period was 03/31/2023.    Past Medical History:  Diagnosis Date   Chronic kidney disease    kidney stone   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   Qualifier: Diagnosis of   By: Levada Schilling       Mild concussion 12/05/2016    Past Surgical History:  Procedure Laterality Date   TUBAL LIGATION      Family History  Problem Relation Age of Onset   Breast cancer Maternal Aunt    Cancer Maternal Aunt        breast   Cancer Maternal Uncle    Breast cancer Maternal Grandmother    Cancer Maternal Grandmother    Hearing loss Neg  Hx     Social History:  reports that she has been smoking cigarettes. She has a 2.5 pack-year smoking history. She has never used smokeless tobacco. She reports that she does not drink alcohol and does not use drugs.  Allergies:  Allergies  Allergen Reactions   Feraheme [Ferumoxytol] Shortness Of Breath    Medications: I have reviewed the patient's current medications.  Review of Systems See pertinent in HPI. All other systems reviewed and non contributory Blood pressure 104/62, pulse 72, temperature 97.8 F (36.6 C), temperature source Oral, resp. rate 16, height 5\' 4"  (1.626 m), weight 69.9 kg, last menstrual period 03/31/2023, SpO2 100%. Physical Exam GENERAL: Well-developed, well-nourished female in no acute distress.  NECK: Supple. Normal thyroid.  LUNGS: Clear to auscultation bilaterally.  HEART: Regular rate and rhythm. ABDOMEN: Soft, nontender, nondistended. No organomegaly. PELVIC: Not performed EXTREMITIES: No cyanosis, clubbing, or edema, 2+ distal pulses.  Results for orders placed or performed during the hospital encounter of 04/23/23 (from the past 48 hour(s))  Type and screen [facility]     Status: None   Collection Time: 04/23/23  8:15 AM  Result Value Ref Range   ABO/RH(D) O POS    Antibody Screen NEG    Sample Expiration 04/26/2023,2359    Unit Number [id number]    Blood Component Type RED CELLS,LR    Unit division 00    Status of Unit ISSUED,FINAL    Transfusion Status OK TO TRANSFUSE    Crossmatch Result      Compatible Performed at [facility]  [facility], [address], [place], [place] [address]    Unit Number [id number]    Blood Component Type RED CELLS,LR    Unit division 00    Status of Unit ISSUED,FINAL    Transfusion Status OK TO TRANSFUSE    Crossmatch Result Compatible   Comprehensive metabolic panel     Status: Abnormal   Collection Time: 04/23/23  8:29 AM  Result Value Ref Range   Sodium 136 135 - 145 mmol/L    Potassium 3.6 3.5 - 5.1 mmol/L   Chloride 104 98 - 111 mmol/L   CO2 19 (L) 22 - 32 mmol/L   Glucose, Bld 118 (H) 70 - 99 mg/dL    Comment: Glucose reference range applies only to samples taken after fasting for at least 8 hours.   BUN 8 6 - 20 mg/dL   Creatinine, Ser 9.52 0.44 - 1.00 mg/dL   Calcium 8.4 (L) 8.9 - 10.3 mg/dL   Total Protein 6.1 (L) 6.5 - 8.1 g/dL   Albumin 3.3 (L) 3.5 - 5.0 g/dL   AST 29 15 - 41 U/L   ALT 25 0 - 44 U/L   Alkaline Phosphatase 32 (L) 38 - 126 U/L   Total Bilirubin 0.7 0.3 - 1.2 mg/dL   GFR, Estimated >84 >13 mL/min    Comment: (NOTE) Calculated using the CKD-EPI Creatinine Equation (2021)    Anion gap 13 5 - 15    Comment: Performed at [facility], [address] [place], [place] [address]  hCG, quantitative, pregnancy     Status: None   Collection Time: 04/23/23  8:29 AM  Result Value Ref Range   hCG, Beta Chain, Quant, S <1 <5 mIU/mL    Comment:          GEST. AGE      CONC.  (mIU/mL)   <=1 WEEK        5 - 50     2 WEEKS       50 - 500     3 WEEKS       100 - 10,000     4 WEEKS     1,000 - 30,000     5 WEEKS     3,500 - 115,000   6-8 WEEKS     12,000 - 270,000    12 WEEKS     15,000 - 220,000        FEMALE AND NON-PREGNANT FEMALE:     LESS THAN 5 mIU/mL Performed at [facility], [address], [place], [place] [address]   SARS Coronavirus 2 by RT PCR (hospital order, performed in [facility] hospital lab) *cepheid single result test* Anterior Nasal Swab     Status: None   Collection Time: 04/23/23  8:32 AM   Specimen: Anterior Nasal Swab  Result Value Ref Range   SARS Coronavirus 2 by RT PCR NEGATIVE NEGATIVE    Comment: Performed at [facility], [address], [place], [place] [address]  CBC     Status: Abnormal   Collection Time: 04/23/23 11:10 AM  Result Value Ref Range   WBC 8.5 4.0 - 10.5 K/uL   RBC 1.64 (L) 3.87 - 5.11 MIL/uL   Hemoglobin 3.9 (LL) 12.0 - 15.0 g/dL    Comment: REPEATED TO  VERIFY Reticulocyte Hemoglobin testing may be clinically indicated, consider ordering this additional test [id number] THIS CRITICAL RESULT HAS VERIFIED AND BEEN CALLED TO KATIE RAND, RN BY DAISY  BLU ON 10 08 2024 AT 1220, AND HAS BEEN READ BACK.     HCT 12.8 (L) 36.0 - 46.0 %   MCV 78.0 (L) 80.0 - 100.0 fL   MCH 23.8 (L) 26.0 - 34.0 pg   MCHC 30.5 30.0 - 36.0 g/dL   RDW 16.1 (H) 09.6 - 04.5 %   Platelets 251 150 - 400 K/uL    Comment: REPEATED TO VERIFY   nRBC 1.5 (H) 0.0 - 0.2 %    Comment: Performed at [facility], [address], [place], [place] [address]  Troponin I (High Sensitivity)     Status: None   Collection Time: 04/23/23 11:10 AM  Result Value Ref Range   Troponin I (High Sensitivity) 8 <18 ng/L    Comment: (NOTE) Elevated high sensitivity troponin I (hsTnI) values and significant  changes across serial measurements may suggest ACS but many other  chronic and acute conditions are known to elevate hsTnI results.  Refer to the "Links" section for chest pain algorithms and additional  guidance. Performed at [facility], [address], [place], [place] [address]   Magnesium     Status: None   Collection Time: 04/23/23 11:10 AM  Result Value Ref Range   Magnesium 1.8 1.7 - 2.4 mg/dL    Comment: Performed at [facility], [address], [place], [place] [address]  Troponin I (High Sensitivity)     Status: None   Collection Time: 04/23/23 11:10 AM  Result Value Ref Range   Troponin I (High Sensitivity) 8 <18 ng/L    Comment: (NOTE) Elevated high sensitivity troponin I (hsTnI) values and significant  changes across serial measurements may suggest ACS but many other  chronic and acute conditions are known to elevate hsTnI results.  Refer to the "Links" section for chest pain algorithms and additional  guidance. Performed at [facility], [address], [place], [place] [address]   Urinalysis, Routine w reflex microscopic -Urine, Clean  Catch     Status: Abnormal   Collection Time: 04/23/23 12:45 PM  Result Value Ref Range   Color, Urine RED (A) YELLOW    Comment: TEST NOT REPORTED DUE TO COLOR INTERFERENCE OF URINE PIGMENT BIOCHEMICALS MAY BE AFFECTED BY COLOR    APPearance TURBID (A) CLEAR    Comment: TEST NOT REPORTED DUE TO COLOR INTERFERENCE OF URINE PIGMENT   Specific Gravity, Urine  1.005 - 1.030    TEST NOT REPORTED DUE TO COLOR INTERFERENCE OF URINE PIGMENT   pH  5.0 - 8.0    TEST NOT REPORTED DUE TO COLOR INTERFERENCE OF URINE PIGMENT   Glucose, UA (A) NEGATIVE mg/dL    TEST NOT REPORTED DUE TO COLOR INTERFERENCE OF URINE PIGMENT   Hgb urine dipstick (A) NEGATIVE    TEST NOT REPORTED DUE TO COLOR INTERFERENCE OF URINE PIGMENT   Bilirubin Urine (A) NEGATIVE    TEST NOT REPORTED DUE TO COLOR INTERFERENCE OF URINE PIGMENT   Ketones, ur (A) NEGATIVE mg/dL    TEST NOT REPORTED DUE TO COLOR INTERFERENCE OF URINE PIGMENT   Protein, ur (A) NEGATIVE mg/dL    TEST NOT REPORTED DUE TO COLOR INTERFERENCE OF URINE PIGMENT   Nitrite (A) NEGATIVE    TEST NOT REPORTED DUE TO COLOR INTERFERENCE OF URINE PIGMENT   Leukocytes,Ua (A) NEGATIVE    TEST NOT REPORTED DUE TO COLOR INTERFERENCE OF URINE PIGMENT    Comment: Performed at [facility] Lab, [address] [place], [place] [address]  Urinalysis, Microscopic (reflex)     Status: Abnormal   Collection Time: 04/23/23 12:45 PM  Result Value Ref Range   RBC / HPF >50 0 - 5 RBC/hpf   WBC, UA 0-5 0 - 5 WBC/hpf   Bacteria, UA RARE (A) NONE SEEN   Squamous Epithelial / HPF 6-10 0 - 5 /HPF    Comment: Performed at [facility], [address] [place], [place] [address]  Prepare RBC (crossmatch)     Status: None   Collection Time: 04/23/23  1:11 PM  Result Value Ref Range   Order Confirmation      ORDER PROCESSED BY BLOOD BANK Performed at [facility], [address] [place], [place] [address]   Iron and TIBC     Status: Abnormal   Collection Time:  04/24/23 12:17 AM  Result Value Ref Range   Iron 18 (L) 28 - 170 ug/dL   TIBC 841 (H) 324 - 401 ug/dL   Saturation Ratios 4 (L) 10.4 - 31.8 %   UIBC 450 ug/dL    Comment: Performed at [facility], [address] [place], [place] [address]  Ferritin     Status: Abnormal   Collection Time: 04/24/23 12:17 AM  Result Value Ref Range   Ferritin 4 (L) 11 - 307 ng/mL    Comment: Performed at [facility], [address], [place], [place] [address]  Hemoglobin and hematocrit, blood     Status: Abnormal   Collection Time: 04/24/23 12:17 AM  Result Value Ref Range   Hemoglobin 6.7 (LL) 12.0 - 15.0 g/dL    Comment: CRITICAL VALUE NOTED.  VALUE IS CONSISTENT WITH PREVIOUSLY REPORTED AND CALLED VALUE. REPEATED TO VERIFY POST TRANSFUSION SPECIMEN    HCT 20.3 (L) 36.0 - 46.0 %    Comment: Performed at [facility] Lab, [address], [place], [place] [address]  Basic metabolic panel     Status: Abnormal   Collection Time: 04/24/23  4:34 AM  Result Value Ref Range   Sodium 137 135 - 145 mmol/L   Potassium 3.7 3.5 - 5.1 mmol/L   Chloride 107 98 - 111 mmol/L   CO2 21 (L) 22 - 32 mmol/L   Glucose, Bld 101 (H) 70 - 99 mg/dL    Comment: Glucose reference range applies only to samples taken after fasting for at least 8 hours.   BUN 10 6 - 20 mg/dL   Creatinine, Ser 2.59 0.44 - 1.00 mg/dL   Calcium 8.3 (L) 8.9 - 10.3 mg/dL   GFR, Estimated >56 >38 mL/min    Comment: (NOTE) Calculated using the CKD-EPI Creatinine Equation (2021)    Anion gap 9 5 - 15    Comment: Performed at [facility], [address] [place], [place] [address]  CBC     Status: Abnormal   Collection Time: 04/24/23  4:34 AM  Result Value Ref Range   WBC 6.6 4.0 - 10.5 K/uL   RBC 2.69 (L) 3.87 - 5.11 MIL/uL   Hemoglobin 7.2 (L) 12.0 - 15.0 g/dL   HCT 32.9 (L) 51.8 - 84.1 %   MCV 80.3 80.0 - 100.0 fL   MCH 26.8 26.0 - 34.0 pg   MCHC 33.3 30.0 - 36.0 g/dL   RDW 66.0 (H) 63.0 - 16.0 %   Platelets 212  150 - 400 K/uL   nRBC 1.5 (H) 0.0 - 0.2 %  Comment: Performed at [facility], [address] [place], [place] [address]    VAS [place] LOWER EXTREMITY VENOUS (DVT) (7a-7p)  Result Date: 04/23/2023  Lower Venous DVT Study Patient Name:  AMERIE MIKRUT  Date of Exam:   04/23/2023 Medical Rec #: [id number]         Accession #:    [id number] Date of Birth: Jun 30, 1981         Patient Gender: F Patient Age:   55 years Exam Location:  [facility] Procedure:      VAS [place] LOWER EXTREMITY VENOUS (DVT) Referring Phys: HAYLEY NAASZ --------------------------------------------------------------------------------  Indications: Edema, and Swelling.  Risk Factors: DVT Left leg 5/24. Anticoagulation: Eliquis. Comparison Study: Improved findings since previous exam 11/20/22 Performing Technologist: Shona Simpson  Examination Guidelines: A complete evaluation includes B-mode imaging, spectral Doppler, color Doppler, and power Doppler as needed of all accessible portions of each vessel. Bilateral testing is considered an integral part of a complete examination. Limited examinations for reoccurring indications may be performed as noted. The reflux portion of the exam is performed with the patient in reverse Trendelenburg.  +-----+---------------+---------+-----------+----------+--------------+ RIGHTCompressibilityPhasicitySpontaneityPropertiesThrombus Aging +-----+---------------+---------+-----------+----------+--------------+ CFV  Full           Yes      Yes                                 +-----+---------------+---------+-----------+----------+--------------+   +---------+---------------+---------+-----------+----------------+-------------+ LEFT     CompressibilityPhasicitySpontaneityProperties      Thrombus                                                                  Aging         +---------+---------------+---------+-----------+----------------+-------------+ CFV      Full            Yes      Yes                        Chronic       +---------+---------------+---------+-----------+----------------+-------------+ SFJ      Full                                                             +---------+---------------+---------+-----------+----------------+-------------+ FV Prox  Full                                                             +---------+---------------+---------+-----------+----------------+-------------+ FV Mid   Full                                                             +---------+---------------+---------+-----------+----------------+-------------+ FV DistalFull                                                             +---------+---------------+---------+-----------+----------------+-------------+  PFV      Full                                                             +---------+---------------+---------+-----------+----------------+-------------+ POP      Partial        Yes      Yes        spongy          Chronic                                                   w/compression                 +---------+---------------+---------+-----------+----------------+-------------+ PTV      Full                                                             +---------+---------------+---------+-----------+----------------+-------------+ PERO     Full                                                             +---------+---------------+---------+-----------+----------------+-------------+ Soleal   Full                                                             +---------+---------------+---------+-----------+----------------+-------------+ Gastroc  Partial                            softly echogenicChronic       +---------+---------------+---------+-----------+----------------+-------------+     Summary: RIGHT: - No evidence of common femoral vein obstruction.   LEFT: - There is recanalized thrombus in  the left popliteal vein(s). There is recanalized thrombus in the left popliteal vein(s). - No cystic structure found in the popliteal fossa.  *See table(s) above for measurements and observations. Electronically signed by Lemar Livings MD on 04/23/2023 at 6:04:00 PM.    Final    CT Angio Chest PE W and/or Wo Contrast  Result Date: 04/23/2023 CLINICAL DATA:  Shortness of breath EXAM: CT ANGIOGRAPHY CHEST WITH CONTRAST TECHNIQUE: Multidetector CT imaging of the chest was performed using the standard protocol during bolus administration of intravenous contrast. Multiplanar CT image reconstructions and MIPs were obtained to evaluate the vascular anatomy. RADIATION DOSE REDUCTION: This exam was performed according to the departmental dose-optimization program which includes automated exposure control, adjustment of the mA and/or kV according to patient size and/or use of iterative reconstruction technique. CONTRAST:  75mL OMNIPAQUE IOHEXOL 350 MG/ML SOLN COMPARISON:  CT angiogram chest 11/20/2022 FINDINGS: Cardiovascular: Heart is nonenlarged. No pericardial effusion. The thoracic  aorta has a normal course and caliber. There is breathing motion throughout the examination significantly limited evaluation for pulmonary emboli, nondiagnostic for small and peripheral emboli. No large or central embolus. The previous central emboli are no longer identified. Mediastinum/Nodes: Triangular-shaped soft tissue in the anterior superior mediastinum consistent with some residual thymus tissue. No separate abnormal lymph node enlargement identified in the axillary region, hilum or mediastinum. Normal caliber thoracic esophagus. Preserved thyroid gland. Lungs/Pleura: Lungs are clear. No pleural effusion or pneumothorax. Upper Abdomen: No acute abnormality. Musculoskeletal: Curvature of the spine. Review of the MIP images confirms the above findings. IMPRESSION: Previous pulmonary emboli are no longer clearly identified. No new emboli  identified although there is significant breathing motion limited evaluation. Nondiagnostic for small and peripheral emboli. Electronically Signed   By: Karen Kays M.D.   On: 04/23/2023 11:55   DG Chest 1 View  Result Date: 04/23/2023 CLINICAL DATA:  Worsening shortness of breath EXAM: CHEST  1 VIEW COMPARISON:  03/07/2023 FINDINGS: The heart size and mediastinal contours are within normal limits. Both lungs are clear. The visualized skeletal structures are unremarkable except for similar slight dextroscoliosis of the spine. Lower chest nipple shadows noted. IMPRESSION: No active disease. Electronically Signed   By: Judie Petit.  Shick M.D.   On: 04/23/2023 09:10    Assessment/Plan: 42 yo P4 with AUB with fibroid uterus and on Eliquis in the setting of PE - Vaginal bleeding improved on Megace. Would continue Megace 40 mg every 12 hours until seen by Dr. Donavan Foil on Monday - Would resume Eliquis - Patient scheduled for surgical consultation on 10/16 with Dr. Donavan Foil.  - Discharge with iron supplementation - May consider IR consult for embolization if patient is interested in pursuing that options. On July ultrasound fibroids were relatively small. Interventional radiologist will determine if she is a candidate for that procedure   Salim Forero 04/24/2023

## 2023-04-24 NOTE — ED Notes (Signed)
ED TO INPATIENT HANDOFF REPORT  ED Nurse Name and Phone #: Anda Kraft Name/Age/Gender Sonya Garner 42 y.o. female Room/Bed: H025C/H025C  Code Status   Code Status: Full Code  Home/SNF/Other Home Patient oriented to: self, place, time, and situation Is this baseline? Yes   Triage Complete: Triage complete  Chief Complaint Symptomatic anemia [D64.9]  Triage Note EMS stated, pt coming from home with SOB , this started yesterday. Pt. Worse with exertion . Has a hx of PE. Last visit had blood transfusion , this cycle has been bleeding since Sept. 15.  Pt. Takes Elaquis, so waiting to see how long she has to be off before having a hysterectomy. Pt. Stated, in May I had a blood clot in left leg and both lungs 20 g in left AC   Allergies Allergies  Allergen Reactions   Feraheme [Ferumoxytol] Shortness Of Breath    Level of Care/Admitting Diagnosis ED Disposition     ED Disposition  Admit   Condition  --   Comment  Hospital Area: [facility] [[id number]]  Level of Care: Med-Surg [16]  May place patient in observation at [facility] or [facility] if equivalent level of care is available:: No  Covid Evaluation: Asymptomatic - no recent exposure (last 10 days) testing not required  Diagnosis: Symptomatic anemia [9562130]  Admitting Physician: Reymundo Poll [[id number]]  Attending Physician: Reymundo Poll [[id number]]          B Medical/Surgery History Past Medical History:  Diagnosis Date   Chronic kidney disease    kidney stone   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   Qualifier: Diagnosis of   By: Levada Schilling       Mild concussion 12/05/2016   Past Surgical History:  Procedure Laterality Date   TUBAL LIGATION       A IV Location/Drains/Wounds Patient Lines/Drains/Airways Status     Active Line/Drains/Airways     Name Placement date Placement time Site Days   Peripheral IV 04/23/23 20 G Left Antecubital 04/23/23  0904  Antecubital  1    Peripheral IV 04/23/23 20 G Right Antecubital 04/23/23  1259  Antecubital  1            Intake/Output Last 24 hours  Intake/Output Summary (Last 24 hours) at 04/24/2023 0835 Last data filed at 04/23/2023 2244 Gross per 24 hour  Intake 2260.25 ml  Output --  Net 2260.25 ml    Labs/Imaging Results for orders placed or performed during the hospital encounter of 04/23/23 (from the past 48 hour(s))  Type and screen [facility]     Status: None   Collection Time: 04/23/23  8:15 AM  Result Value Ref Range   ABO/RH(D) O POS    Antibody Screen NEG    Sample Expiration 04/26/2023,2359    Unit Number [id number]    Blood Component Type RED CELLS,LR    Unit division 00    Status of Unit ISSUED,FINAL    Transfusion Status OK TO TRANSFUSE    Crossmatch Result      Compatible Performed at [facility], [address] [place], [place] [address]    Unit Number [id number]    Blood Component Type RED CELLS,LR    Unit division 00    Status of Unit ISSUED,FINAL    Transfusion Status OK TO TRANSFUSE    Crossmatch Result Compatible   Comprehensive metabolic panel     Status: Abnormal   Collection Time: 04/23/23  8:29 AM  Result Value Ref Range   Sodium 136 135 - 145 mmol/L   Potassium 3.6 3.5 - 5.1 mmol/L   Chloride 104 98 - 111 mmol/L   CO2 19 (L) 22 - 32 mmol/L   Glucose, Bld 118 (H) 70 - 99 mg/dL    Comment: Glucose reference range applies only to samples taken after fasting for at least 8 hours.   BUN 8 6 - 20 mg/dL   Creatinine, Ser 1.61 0.44 - 1.00 mg/dL   Calcium 8.4 (L) 8.9 - 10.3 mg/dL   Total Protein 6.1 (L) 6.5 - 8.1 g/dL   Albumin 3.3 (L) 3.5 - 5.0 g/dL   AST 29 15 - 41 U/L   ALT 25 0 - 44 U/L   Alkaline Phosphatase 32 (L) 38 - 126 U/L   Total Bilirubin 0.7 0.3 - 1.2 mg/dL   GFR, Estimated >09 >60 mL/min    Comment: (NOTE) Calculated using the CKD-EPI Creatinine Equation (2021)    Anion gap 13 5 - 15    Comment: Performed at [facility], [address] [place], [place] [address]  hCG, quantitative, pregnancy     Status: None   Collection Time: 04/23/23  8:29 AM  Result Value Ref Range   hCG, Beta Chain, Quant, S <1 <5 mIU/mL    Comment:          GEST. AGE      CONC.  (mIU/mL)   <=1 WEEK        5 - 50     2 WEEKS       50 - 500     3 WEEKS       100 - 10,000     4 WEEKS     1,000 - 30,000     5 WEEKS     3,500 - 115,000   6-8 WEEKS     12,000 - 270,000    12 WEEKS     15,000 - 220,000        FEMALE AND NON-PREGNANT FEMALE:     LESS THAN 5 mIU/mL Performed at [facility], [address] [place], [place] [address]   SARS Coronavirus 2 by RT PCR (hospital order, performed in [facility] hospital lab) *cepheid single result test* Anterior Nasal Swab     Status: None   Collection Time: 04/23/23  8:32 AM   Specimen: Anterior Nasal Swab  Result Value Ref Range   SARS Coronavirus 2 by RT PCR NEGATIVE NEGATIVE    Comment: Performed at [facility], [address], [place], [place] [address]  CBC     Status: Abnormal   Collection Time: 04/23/23 11:10 AM  Result Value Ref Range   WBC 8.5 4.0 - 10.5 K/uL   RBC 1.64 (L) 3.87 - 5.11 MIL/uL   Hemoglobin 3.9 (LL) 12.0 - 15.0 g/dL    Comment: REPEATED TO VERIFY Reticulocyte Hemoglobin testing may be clinically indicated, consider ordering this additional test [id number] THIS CRITICAL RESULT HAS VERIFIED AND BEEN CALLED TO KATIE RAND, RN BY DAISY BLU ON 10 08 2024 AT 1220, AND HAS BEEN READ BACK.     HCT 12.8 (L) 36.0 - 46.0 %   MCV 78.0 (L) 80.0 - 100.0 fL   MCH 23.8 (L) 26.0 - 34.0 pg   MCHC 30.5 30.0 - 36.0 g/dL   RDW 57.8 (H) 46.9 - 62.9 %   Platelets 251 150 - 400 K/uL    Comment: REPEATED TO  VERIFY   nRBC 1.5 (H) 0.0 - 0.2 %    Comment: Performed at [facility], [address], [place], [place] [address]  Troponin I (High Sensitivity)     Status: None   Collection Time: 04/23/23 11:10 AM  Result Value Ref Range   Troponin I (High  Sensitivity) 8 <18 ng/L    Comment: (NOTE) Elevated high sensitivity troponin I (hsTnI) values and significant  changes across serial measurements may suggest ACS but many other  chronic and acute conditions are known to elevate hsTnI results.  Refer to the "Links" section for chest pain algorithms and additional  guidance. Performed at [facility], [address], [place], [place] [address]   Magnesium     Status: None   Collection Time: 04/23/23 11:10 AM  Result Value Ref Range   Magnesium 1.8 1.7 - 2.4 mg/dL    Comment: Performed at [facility], [address] [place], [place] [address]  Troponin I (High Sensitivity)     Status: None   Collection Time: 04/23/23 11:10 AM  Result Value Ref Range   Troponin I (High Sensitivity) 8 <18 ng/L    Comment: (NOTE) Elevated high sensitivity troponin I (hsTnI) values and significant  changes across serial measurements may suggest ACS but many other  chronic and acute conditions are known to elevate hsTnI results.  Refer to the "Links" section for chest pain algorithms and additional  guidance. Performed at [facility], [address] [place], [place] [address]   Urinalysis, Routine w reflex microscopic -Urine, Clean Catch     Status: Abnormal   Collection Time: 04/23/23 12:45 PM  Result Value Ref Range   Color, Urine RED (A) YELLOW    Comment: TEST NOT REPORTED DUE TO COLOR INTERFERENCE OF URINE PIGMENT BIOCHEMICALS MAY BE AFFECTED BY COLOR    APPearance TURBID (A) CLEAR    Comment: TEST NOT REPORTED DUE TO COLOR INTERFERENCE OF URINE PIGMENT   Specific Gravity, Urine  1.005 - 1.030    TEST NOT REPORTED DUE TO COLOR INTERFERENCE OF URINE PIGMENT   pH  5.0 - 8.0    TEST NOT REPORTED DUE TO COLOR INTERFERENCE OF URINE PIGMENT   Glucose, UA (A) NEGATIVE mg/dL    TEST NOT REPORTED DUE TO COLOR INTERFERENCE OF URINE PIGMENT   Hgb urine dipstick (A) NEGATIVE    TEST NOT REPORTED DUE TO COLOR INTERFERENCE OF URINE  PIGMENT   Bilirubin Urine (A) NEGATIVE    TEST NOT REPORTED DUE TO COLOR INTERFERENCE OF URINE PIGMENT   Ketones, ur (A) NEGATIVE mg/dL    TEST NOT REPORTED DUE TO COLOR INTERFERENCE OF URINE PIGMENT   Protein, ur (A) NEGATIVE mg/dL    TEST NOT REPORTED DUE TO COLOR INTERFERENCE OF URINE PIGMENT   Nitrite (A) NEGATIVE    TEST NOT REPORTED DUE TO COLOR INTERFERENCE OF URINE PIGMENT   Leukocytes,Ua (A) NEGATIVE    TEST NOT REPORTED DUE TO COLOR INTERFERENCE OF URINE PIGMENT    Comment: Performed at [facility], [address] [place], [place] [address]  Urinalysis, Microscopic (reflex)     Status: Abnormal   Collection Time: 04/23/23 12:45 PM  Result Value Ref Range   RBC / HPF >50 0 - 5 RBC/hpf   WBC, UA 0-5 0 - 5 WBC/hpf   Bacteria, UA RARE (A) NONE SEEN   Squamous Epithelial / HPF 6-10 0 - 5 /HPF    Comment: Performed  at [facility], [address] [place], [place] [address]  Prepare RBC (crossmatch)     Status: None   Collection Time: 04/23/23  1:11 PM  Result Value Ref Range   Order Confirmation      ORDER PROCESSED BY BLOOD BANK Performed at [facility], [address], [place], [place] [address]   Iron and TIBC     Status: Abnormal   Collection Time: 04/24/23 12:17 AM  Result Value Ref Range   Iron 18 (L) 28 - 170 ug/dL   TIBC 401 (H) 027 - 253 ug/dL   Saturation Ratios 4 (L) 10.4 - 31.8 %   UIBC 450 ug/dL    Comment: Performed at [facility], [address] [place], [place] [address]  Ferritin     Status: Abnormal   Collection Time: 04/24/23 12:17 AM  Result Value Ref Range   Ferritin 4 (L) 11 - 307 ng/mL    Comment: Performed at [facility], [address] [place], [place] [address]  Hemoglobin and hematocrit, blood     Status: Abnormal   Collection Time: 04/24/23 12:17 AM  Result Value Ref Range   Hemoglobin 6.7 (LL) 12.0 - 15.0 g/dL    Comment: CRITICAL VALUE NOTED.  VALUE IS CONSISTENT WITH PREVIOUSLY REPORTED AND CALLED  VALUE. REPEATED TO VERIFY POST TRANSFUSION SPECIMEN    HCT 20.3 (L) 36.0 - 46.0 %    Comment: Performed at [facility], [address] [place], [place] [address]  Basic metabolic panel     Status: Abnormal   Collection Time: 04/24/23  4:34 AM  Result Value Ref Range   Sodium 137 135 - 145 mmol/L   Potassium 3.7 3.5 - 5.1 mmol/L   Chloride 107 98 - 111 mmol/L   CO2 21 (L) 22 - 32 mmol/L   Glucose, Bld 101 (H) 70 - 99 mg/dL    Comment: Glucose reference range applies only to samples taken after fasting for at least 8 hours.   BUN 10 6 - 20 mg/dL   Creatinine, Ser 8.75 0.44 - 1.00 mg/dL   Calcium 8.3 (L) 8.9 - 10.3 mg/dL   GFR, Estimated >64 >33 mL/min    Comment: (NOTE) Calculated using the CKD-EPI Creatinine Equation (2021)    Anion gap 9 5 - 15    Comment: Performed at [facility], [address], [place], [place] [address]  CBC     Status: Abnormal   Collection Time: 04/24/23  4:34 AM  Result Value Ref Range   WBC 6.6 4.0 - 10.5 K/uL   RBC 2.69 (L) 3.87 - 5.11 MIL/uL   Hemoglobin 7.2 (L) 12.0 - 15.0 g/dL   HCT 84.1 (L) 66.0 - 63.0 %   MCV 80.3 80.0 - 100.0 fL   MCH 26.8 26.0 - 34.0 pg   MCHC 33.3 30.0 - 36.0 g/dL   RDW 16.0 (H) 10.9 - 32.3 %   Platelets 212 150 - 400 K/uL   nRBC 1.5 (H) 0.0 - 0.2 %    Comment: Performed at [facility] [place] [facility], [address] [place], [place] [address]   VAS [place] LOWER EXTREMITY VENOUS (DVT) (7a-7p)  Result Date: 04/23/2023  Lower Venous DVT Study Patient Name:  Sonya Garner  Date of Exam:   04/23/2023 Medical Rec #: [id number]         Accession #:    [id number] Date of Birth: September 12, 1980  Patient Gender: F Patient Age:   74 years Exam Location:  [facility] Procedure:      VAS [place] LOWER EXTREMITY VENOUS (DVT) Referring Phys: HAYLEY NAASZ --------------------------------------------------------------------------------  Indications: Edema, and Swelling.  Risk Factors: DVT Left leg 5/24. Anticoagulation: Eliquis.  Comparison Study: Improved findings since previous exam 11/20/22 Performing Technologist: Shona Simpson  Examination Guidelines: A complete evaluation includes B-mode imaging, spectral Doppler, color Doppler, and power Doppler as needed of all accessible portions of each vessel. Bilateral testing is considered an integral part of a complete examination. Limited examinations for reoccurring indications may be performed as noted. The reflux portion of the exam is performed with the patient in reverse Trendelenburg.  +-----+---------------+---------+-----------+----------+--------------+ RIGHTCompressibilityPhasicitySpontaneityPropertiesThrombus Aging +-----+---------------+---------+-----------+----------+--------------+ CFV  Full           Yes      Yes                                 +-----+---------------+---------+-----------+----------+--------------+   +---------+---------------+---------+-----------+----------------+-------------+ LEFT     CompressibilityPhasicitySpontaneityProperties      Thrombus                                                                  Aging         +---------+---------------+---------+-----------+----------------+-------------+ CFV      Full           Yes      Yes                        Chronic       +---------+---------------+---------+-----------+----------------+-------------+ SFJ      Full                                                             +---------+---------------+---------+-----------+----------------+-------------+ FV Prox  Full                                                             +---------+---------------+---------+-----------+----------------+-------------+ FV Mid   Full                                                             +---------+---------------+---------+-----------+----------------+-------------+ FV DistalFull                                                              +---------+---------------+---------+-----------+----------------+-------------+ PFV      Full                                                             +---------+---------------+---------+-----------+----------------+-------------+  POP      Partial        Yes      Yes        spongy          Chronic                                                   w/compression                 +---------+---------------+---------+-----------+----------------+-------------+ PTV      Full                                                             +---------+---------------+---------+-----------+----------------+-------------+ PERO     Full                                                             +---------+---------------+---------+-----------+----------------+-------------+ Soleal   Full                                                             +---------+---------------+---------+-----------+----------------+-------------+ Gastroc  Partial                            softly echogenicChronic       +---------+---------------+---------+-----------+----------------+-------------+     Summary: RIGHT: - No evidence of common femoral vein obstruction.   LEFT: - There is recanalized thrombus in the left popliteal vein(s). There is recanalized thrombus in the left popliteal vein(s). - No cystic structure found in the popliteal fossa.  *See table(s) above for measurements and observations. Electronically signed by Lemar Livings MD on 04/23/2023 at 6:04:00 PM.    Final    CT Angio Chest PE W and/or Wo Contrast  Result Date: 04/23/2023 CLINICAL DATA:  Shortness of breath EXAM: CT ANGIOGRAPHY CHEST WITH CONTRAST TECHNIQUE: Multidetector CT imaging of the chest was performed using the standard protocol during bolus administration of intravenous contrast. Multiplanar CT image reconstructions and MIPs were obtained to evaluate the vascular anatomy. RADIATION DOSE REDUCTION: This exam was  performed according to the departmental dose-optimization program which includes automated exposure control, adjustment of the mA and/or kV according to patient size and/or use of iterative reconstruction technique. CONTRAST:  75mL OMNIPAQUE IOHEXOL 350 MG/ML SOLN COMPARISON:  CT angiogram chest 11/20/2022 FINDINGS: Cardiovascular: Heart is nonenlarged. No pericardial effusion. The thoracic aorta has a normal course and caliber. There is breathing motion throughout the examination significantly limited evaluation for pulmonary emboli, nondiagnostic for small and peripheral emboli. No large or central embolus. The previous central emboli are no longer identified. Mediastinum/Nodes: Triangular-shaped soft tissue in the anterior superior mediastinum consistent with some residual thymus tissue. No separate abnormal lymph node enlargement identified in the axillary region, hilum or mediastinum.  Normal caliber thoracic esophagus. Preserved thyroid gland. Lungs/Pleura: Lungs are clear. No pleural effusion or pneumothorax. Upper Abdomen: No acute abnormality. Musculoskeletal: Curvature of the spine. Review of the MIP images confirms the above findings. IMPRESSION: Previous pulmonary emboli are no longer clearly identified. No new emboli identified although there is significant breathing motion limited evaluation. Nondiagnostic for small and peripheral emboli. Electronically Signed   By: Karen Kays M.D.   On: 04/23/2023 11:55   DG Chest 1 View  Result Date: 04/23/2023 CLINICAL DATA:  Worsening shortness of breath EXAM: CHEST  1 VIEW COMPARISON:  03/07/2023 FINDINGS: The heart size and mediastinal contours are within normal limits. Both lungs are clear. The visualized skeletal structures are unremarkable except for similar slight dextroscoliosis of the spine. Lower chest nipple shadows noted. IMPRESSION: No active disease. Electronically Signed   By: Judie Petit.  Shick M.D.   On: 04/23/2023 09:10    Pending Labs Unresulted  Labs (From admission, onward)     Start     Ordered   Pending  Hemoglobin and hematocrit, blood  Once,   R        Pending            Vitals/Pain Today's Vitals   04/24/23 0300 04/24/23 0400 04/24/23 0530 04/24/23 0636  BP: (!) 97/56 103/65 (!) 96/52   Pulse:   82   Resp: 19 16 20    Temp:    98.1 F (36.7 C)  TempSrc:    Oral  SpO2:  100% 100%   Weight:      Height:      PainSc:        Isolation Precautions No active isolations  Medications Medications  megestrol (MEGACE) tablet 40 mg (40 mg Oral Given 04/23/23 2247)  acetaminophen (TYLENOL) tablet 650 mg (has no administration in time range)    Or  acetaminophen (TYLENOL) suppository 650 mg (has no administration in time range)  senna-docusate (Senokot-S) tablet 1 tablet (has no administration in time range)  influenza vac split trivalent PF (FLULAVAL) injection 0.5 mL (has no administration in time range)  ondansetron (ZOFRAN) injection 4 mg (4 mg Intravenous Given 04/23/23 0914)  acetaminophen (TYLENOL) tablet 1,000 mg (1,000 mg Oral Given 04/23/23 1303)  iohexol (OMNIPAQUE) 350 MG/ML injection 75 mL (75 mLs Intravenous Contrast Given 04/23/23 1055)  0.9 %  sodium chloride infusion (Manually program via Guardrails IV Fluids) (0 mLs Intravenous Stopped 04/23/23 2244)    Mobility walks     Focused Assessments Low HGB   R Recommendations: See Admitting Provider Note  Report given to:   Additional Notes:

## 2023-04-24 NOTE — Progress Notes (Addendum)
   Subjective:  Patient seen and examined at bedside. She tolerated the blood transfusions without reaction. She states she is feeling better today. She still has a mild headache and some tolerable pelvic cramps. No shortness of breath, chest pain, dizziness. No nausea or vomiting. She is tolerating PO. She is still having heavy vaginal bleeding, no change in the amount.  Objective:  Vital signs in last 24 hours: Vitals:   04/24/23 0300 04/24/23 0400 04/24/23 0530 04/24/23 0636  BP: (!) 97/56 103/65 (!) 96/52   Pulse:   82   Resp: 19 16 20    Temp:    98.1 F (36.7 C)  TempSrc:    Oral  SpO2:  100% 100%   Weight:      Height:       Weight change:   Intake/Output Summary (Last 24 hours) at 04/24/2023 0645 Last data filed at 04/23/2023 2244 Gross per 24 hour  Intake 2260.25 ml  Output --  Net 2260.25 ml   Physical Examination:  General appearance - alert, well appearing, sitting up in bed, very pleasant and conversational Mental status - alert, oriented to person, place, and time Eyes - pupils equal and reactive, extraocular eye movements intact, sclera anicteric Chest - clear to auscultation, no wheezes, rales or rhonchi, symmetric air entry Heart - normal rate, regular rhythm, normal S1, S2, no murmurs, rubs, clicks or gallops Abdomen - soft, nontender, nondistended, no masses or organomegaly Extremities - peripheral pulses normal, no pedal edema, no clubbing or cyanosis  Assessment/Plan:  Principal Problem:   Symptomatic anemia   Acute Blood Loss 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 She is still having heavy menstrual bleeding. Well appearing on exam, HDS. Hgb trend: 3.9 to 6.2 after 2 units PRBC. Then, hgb 7.2 this morning without intervention. Still concerned that hgb will drop given active bleeding. Patient is currently on megace. However, she has trialed megace and IM Depo Lupron  without resolution or improvement in the extent of bleeding. Spoke with OBGYN, who discussed patient's outpatient management plan with [place]. It seems she has an appointment with Dr. Donavan Foil on Monday and they had plans for Sonota and IM Depo to manage bleeding until patient has completed her 6 month course of Eliquis, at that time they would perform TVH. Unfortunately patient still has another month of Eliquis. She started on May 7th, making Nov 7th the anticipated stop date. We discussed IR uterine artery ablation with patient and she seemed willing to consider this. Will follow up on OB GYN's discussion and recommendations with patient before proceeding with planned intervention. After weighing risks and benefits, it was decided to hold patient's home eliquis due to symptomatic anemia with ongoing bleeding that has not improved.  PLAN: - Recheck H/H this afternoon - Transfuse as needed for hgb <7 - May start IV iron, pending pharmacy rec's given allergy hx - If no IV iron, will restart home oral iron supplement - Holding home eliquis due to active bleeding - Continue megace 40mg  BID - OB GYN consulted, follow up on recs - May consult IR pending discussion with OBGYN  Chronic: - Hx of PE - PE no longer visualized on repeat CTA. Patient has been on eliquis since 11/20/22. Holding eliquis.   Diet: Normal IVF: None, VTE: SCDs Code: Full   LOS: 0 days   Sonya Garner, Medical Student 04/24/2023, 6:45 AM

## 2023-04-24 NOTE — Telephone Encounter (Signed)
Return call to Pharmacy. Informed Pharmacy Staff that Depot Lupron has been approved. She reports when they run the prescription it must be filled at a Specialty Pharmacy.   Called [facility] to inquire if they fill this prescription. Spoke with Delorise Shiner and she reports they are not able to get it.   Called [facility] to see if they can get the Rx. They can order it, I informed him that PA has been approved previously. Order sent to [facility].   Called patient to inform her that Lupron has been approved and is being filled by [facility]. Patient did not answer. LM for her to check her My Chart message.

## 2023-04-24 NOTE — Plan of Care (Signed)
  Problem: Activity: Goal: Risk for activity intolerance will decrease Outcome: Progressing   Problem: Nutrition: Goal: Adequate nutrition will be maintained Outcome: Progressing   Problem: Pain Managment: Goal: General experience of comfort will improve Outcome: Progressing   Problem: Safety: Goal: Ability to remain free from injury will improve Outcome: Progressing   Problem: Skin Integrity: Goal: Risk for impaired skin integrity will decrease Outcome: Progressing   

## 2023-04-24 NOTE — Hospital Course (Addendum)
1. Symptomatic anemia 2. Fe deficiency anemia - Patient reported several days of exertional shortness of breath, dizziness, and headache in the setting of prolonged heavy menstrual bleeding.  - She was non toxic appearing, HDS. Conjunctival and palatal pallor noted on exam.  - Hgb 3.9 on arrival, MCV 78. Transfused 2 units of PRBC with improvement to 6.7 then 7.2. - Symptoms resolved with transfusion. Patient still having vaginal bleeding - Iron studies revealed Fe 18 with ferritin of 4, elevated TIBC - confirming Fe deficiency.  - With assistance from pharmacy, patient received IV Iron sucrose following premedication with famotidine, benadryl, and methylprednisolone to prevent possible reaction given patient's prior allergy. She tolerated well without reaction.  - Referral placed for outpatient Iron infusions. Recommend rechecking iron studies in 4-6 weeks to re-evaluate.  - OBGYN and IR were consulted to discuss options for source control. See below. - Hgb stable >7 through hospitalization. - At time of discharge, hgb stable at 8.7  3. Abnormal uterine bleeding 2/2 Fibroids - Patient with several month history of heavy menstrual cycles since starting Eliquis in May. She has required admission x3 for symptomatic blood loss anemia - Fibroids found on TVUS last admission. - Patient had been discussing management options with OBGYN outpatient, failed depo shots, was meant to have outpatient consultation for [place] to manage bleeding until patient could complete her 7month Eliquis course and have a total vaginal hysterectomy performed. - OBGYN consulted and did not feel comfortable with emergent hysterectomy. They recommended Megace 40mg  BID and IR consultation for uterine artery embolization if patient chooses. - IMTS Team had a long discussion on risks and benefits of source control options with patient. She elected to pursue IR evaluation. - IR agreed to perform uterine artery embolization  following pelvic MRI, which was done to further characterize fibroids. - In preparation for expected blood loss from surgery and possible increased vaginal bleeding following, patient was given another unit of PRBC.  - [place] performed on 04/25/23. Patient was provided with tiered pain management. She tolerated the procedure well with improvement in post-op pain and vaginal bleeding. - Will discharge home to follow up with OBGYN at scheduled appointment on 10/14 with Dr. Donavan Foil.   4. 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 - CTA chest in the ED noted resolution of bilateral PE - [place] L lower extremity with recanalized thrombus in L popliteal vein. - Given hgb of 3.9 and completion of 5 out of a 6 month course of anticoagulation for PE, we felt it was safe to hold anticoagulation. Given that her VTEs were unprovoked she is of course at risk for recurrent clots, however a lower dose / prophylactic dose anticoagulation would also be appropriate to start once her source of uterine bleeding is addressed and stabilized. Discussed with patient and pharmacy team to hold anticoagulation until [facility] follow-up appointment to evaluate if patient should resume anticoagulation likely at lower dose (Eliquis 2.5 mg BID).

## 2023-04-24 NOTE — Telephone Encounter (Addendum)
Called [employer] to inquire about PA response for Depot Lupron. Spoke with April. She transferred me to [employer]. Spoke with Mikle Bosworth who reports it was cancelled as do not have a Tax ID for [facility] since they will be filling the medication. They do not notify the office in this situation.   The plan is to resubmit it again and review with supervisor. Supervisor to take care of today. Asked for me to call back later today.   Reviewed patient will pick up at Pharmacy and will be administered via office.

## 2023-04-24 NOTE — Telephone Encounter (Signed)
Opened in error

## 2023-04-24 NOTE — Telephone Encounter (Signed)
Received approval for Depot Lupron from [employer] from 04/24/2023-07/23/2023. Pharmacy busy. Will need to call back at a later time.

## 2023-04-25 ENCOUNTER — Observation Stay (HOSPITAL_COMMUNITY): Payer: Managed Care, Other (non HMO)

## 2023-04-25 ENCOUNTER — Encounter (HOSPITAL_COMMUNITY): Payer: Self-pay | Admitting: Internal Medicine

## 2023-04-25 ENCOUNTER — Other Ambulatory Visit: Payer: Self-pay

## 2023-04-25 DIAGNOSIS — F17210 Nicotine dependence, cigarettes, uncomplicated: Secondary | ICD-10-CM | POA: Diagnosis present

## 2023-04-25 DIAGNOSIS — T45515A Adverse effect of anticoagulants, initial encounter: Secondary | ICD-10-CM | POA: Diagnosis present

## 2023-04-25 DIAGNOSIS — Z86718 Personal history of other venous thrombosis and embolism: Secondary | ICD-10-CM | POA: Diagnosis not present

## 2023-04-25 DIAGNOSIS — D509 Iron deficiency anemia, unspecified: Secondary | ICD-10-CM | POA: Diagnosis not present

## 2023-04-25 DIAGNOSIS — R231 Pallor: Secondary | ICD-10-CM | POA: Diagnosis present

## 2023-04-25 DIAGNOSIS — D500 Iron deficiency anemia secondary to blood loss (chronic): Secondary | ICD-10-CM | POA: Diagnosis present

## 2023-04-25 DIAGNOSIS — D6832 Hemorrhagic disorder due to extrinsic circulating anticoagulants: Secondary | ICD-10-CM | POA: Diagnosis present

## 2023-04-25 DIAGNOSIS — Z7901 Long term (current) use of anticoagulants: Secondary | ICD-10-CM | POA: Diagnosis not present

## 2023-04-25 DIAGNOSIS — D251 Intramural leiomyoma of uterus: Secondary | ICD-10-CM | POA: Diagnosis present

## 2023-04-25 DIAGNOSIS — Z9851 Tubal ligation status: Secondary | ICD-10-CM | POA: Diagnosis not present

## 2023-04-25 DIAGNOSIS — Z23 Encounter for immunization: Secondary | ICD-10-CM | POA: Diagnosis not present

## 2023-04-25 DIAGNOSIS — Z86711 Personal history of pulmonary embolism: Secondary | ICD-10-CM | POA: Diagnosis not present

## 2023-04-25 DIAGNOSIS — Z888 Allergy status to other drugs, medicaments and biological substances status: Secondary | ICD-10-CM | POA: Diagnosis not present

## 2023-04-25 DIAGNOSIS — N938 Other specified abnormal uterine and vaginal bleeding: Secondary | ICD-10-CM | POA: Diagnosis present

## 2023-04-25 DIAGNOSIS — Z1152 Encounter for screening for COVID-19: Secondary | ICD-10-CM | POA: Diagnosis not present

## 2023-04-25 DIAGNOSIS — N920 Excessive and frequent menstruation with regular cycle: Secondary | ICD-10-CM | POA: Diagnosis present

## 2023-04-25 DIAGNOSIS — D250 Submucous leiomyoma of uterus: Secondary | ICD-10-CM | POA: Diagnosis present

## 2023-04-25 DIAGNOSIS — D259 Leiomyoma of uterus, unspecified: Secondary | ICD-10-CM | POA: Diagnosis not present

## 2023-04-25 DIAGNOSIS — D62 Acute posthemorrhagic anemia: Secondary | ICD-10-CM | POA: Diagnosis present

## 2023-04-25 DIAGNOSIS — F17290 Nicotine dependence, other tobacco product, uncomplicated: Secondary | ICD-10-CM | POA: Diagnosis present

## 2023-04-25 HISTORY — PX: IR ANGIOGRAM PELVIS SELECTIVE OR SUPRASELECTIVE: IMG661

## 2023-04-25 HISTORY — PX: IR ANGIOGRAM SELECTIVE EACH ADDITIONAL VESSEL: IMG667

## 2023-04-25 HISTORY — PX: IR EMBO ART  VEN HEMORR LYMPH EXTRAV  INC GUIDE ROADMAPPING: IMG5450

## 2023-04-25 HISTORY — PX: IR US GUIDE VASC ACCESS LEFT: IMG2389

## 2023-04-25 LAB — CBC
HCT: 22.9 % — ABNORMAL LOW (ref 36.0–46.0)
Hemoglobin: 7.3 g/dL — ABNORMAL LOW (ref 12.0–15.0)
MCH: 25.7 pg — ABNORMAL LOW (ref 26.0–34.0)
MCHC: 31.9 g/dL (ref 30.0–36.0)
MCV: 80.6 fL (ref 80.0–100.0)
Platelets: 237 10*3/uL (ref 150–400)
RBC: 2.84 MIL/uL — ABNORMAL LOW (ref 3.87–5.11)
RDW: 19.5 % — ABNORMAL HIGH (ref 11.5–15.5)
WBC: 10.5 10*3/uL (ref 4.0–10.5)
nRBC: 2.5 % — ABNORMAL HIGH (ref 0.0–0.2)

## 2023-04-25 LAB — PROTIME-INR
INR: 1.1 (ref 0.8–1.2)
Prothrombin Time: 14.5 s (ref 11.4–15.2)

## 2023-04-25 LAB — PREPARE RBC (CROSSMATCH)

## 2023-04-25 MED ORDER — SODIUM CHLORIDE 0.9 % IV SOLN: 250.0000 mL | INTRAVENOUS | Status: AC | PRN

## 2023-04-25 MED ORDER — NITROGLYCERIN 1 MG/10 ML FOR IR/CATH LAB
INTRA_ARTERIAL | Status: AC
  Filled 2023-04-25: qty 10

## 2023-04-25 MED ORDER — ONDANSETRON HCL 4 MG/2ML IJ SOLN
INTRAMUSCULAR | Status: AC
  Filled 2023-04-25: qty 2

## 2023-04-25 MED ORDER — NITROGLYCERIN 1 MG/10 ML FOR IR/CATH LAB
INTRA_ARTERIAL | Status: AC | PRN
Start: 2023-04-25 — End: 2023-04-25
  Administered 2023-04-25: 200 ug via INTRA_ARTERIAL

## 2023-04-25 MED ORDER — MORPHINE SULFATE (PF) 2 MG/ML IV SOLN
2.0000 mg | INTRAVENOUS | Status: AC | PRN
  Administered 2023-04-25: 2 mg via INTRAVENOUS
  Filled 2023-04-25: qty 1

## 2023-04-25 MED ORDER — ONDANSETRON HCL 4 MG/2ML IJ SOLN
4.0000 mg | Freq: Four times a day (QID) | INTRAMUSCULAR | Status: AC
  Administered 2023-04-25 – 2023-04-26 (×3): 4 mg via INTRAVENOUS
  Filled 2023-04-25 (×4): qty 2

## 2023-04-25 MED ORDER — ONDANSETRON HCL 4 MG/2ML IJ SOLN
INTRAMUSCULAR | Status: AC | PRN
Start: 2023-04-25 — End: 2023-04-25
  Administered 2023-04-25: 4 mg via INTRAVENOUS

## 2023-04-25 MED ORDER — GELATIN ABSORBABLE 12-7 MM EX MISC
CUTANEOUS | Status: AC
  Filled 2023-04-25: qty 2

## 2023-04-25 MED ORDER — CEFAZOLIN SODIUM-DEXTROSE 1-4 GM/50ML-% IV SOLN
INTRAVENOUS | Status: AC | PRN
  Administered 2023-04-25: 2 g via INTRAVENOUS

## 2023-04-25 MED ORDER — KETOROLAC TROMETHAMINE 30 MG/ML IJ SOLN
INTRAMUSCULAR | Status: AC
  Filled 2023-04-25: qty 1

## 2023-04-25 MED ORDER — MIDAZOLAM HCL 2 MG/2ML IJ SOLN
INTRAMUSCULAR | Status: AC | PRN
Start: 2023-04-25 — End: 2023-04-25
  Administered 2023-04-25: .5 mg via INTRAVENOUS
  Administered 2023-04-25 (×3): 1 mg via INTRAVENOUS
  Administered 2023-04-25: .5 mg via INTRAVENOUS

## 2023-04-25 MED ORDER — VERAPAMIL HCL 2.5 MG/ML IV SOLN
INTRAVENOUS | Status: AC | PRN
  Administered 2023-04-25: 2.5 mg via INTRAVENOUS

## 2023-04-25 MED ORDER — GADOBUTROL 1 MMOL/ML IV SOLN
6.0000 mL | Freq: Once | INTRAVENOUS | Status: AC | PRN
  Administered 2023-04-25: 6 mL via INTRAVENOUS

## 2023-04-25 MED ORDER — HEPARIN SODIUM (PORCINE) 1000 UNIT/ML IJ SOLN
INTRAMUSCULAR | Status: AC | PRN
  Administered 2023-04-25: 5000 [IU] via INTRA_ARTERIAL

## 2023-04-25 MED ORDER — SODIUM CHLORIDE 0.9% IV SOLUTION: Freq: Once | INTRAVENOUS | Status: AC

## 2023-04-25 MED ORDER — FENTANYL CITRATE (PF) 100 MCG/2ML IJ SOLN
INTRAMUSCULAR | Status: AC | PRN
Start: 2023-04-25 — End: 2023-04-25
  Administered 2023-04-25: 25 ug via INTRAVENOUS
  Administered 2023-04-25 (×3): 50 ug via INTRAVENOUS
  Administered 2023-04-25: 25 ug via INTRAVENOUS

## 2023-04-25 MED ORDER — OXYCODONE HCL 5 MG PO TABS
10.0000 mg | ORAL_TABLET | ORAL | Status: DC | PRN
  Administered 2023-04-25 – 2023-04-26 (×3): 10 mg via ORAL
  Filled 2023-04-25 (×4): qty 2

## 2023-04-25 MED ORDER — MIDAZOLAM HCL 2 MG/2ML IJ SOLN
INTRAMUSCULAR | Status: AC
  Filled 2023-04-25: qty 4

## 2023-04-25 MED ORDER — CEFAZOLIN SODIUM-DEXTROSE 2-4 GM/100ML-% IV SOLN
INTRAVENOUS | Status: AC
  Filled 2023-04-25: qty 100

## 2023-04-25 MED ORDER — SODIUM CHLORIDE 0.9 % IV SOLN: 12.5000 mg | Freq: Four times a day (QID) | INTRAVENOUS | Status: AC | PRN

## 2023-04-25 MED ORDER — IOHEXOL 300 MG/ML  SOLN
150.0000 mL | Freq: Once | INTRAMUSCULAR | Status: AC | PRN
  Administered 2023-04-25: 50 mL via INTRA_ARTERIAL

## 2023-04-25 MED ORDER — VERAPAMIL HCL 2.5 MG/ML IV SOLN
INTRAVENOUS | Status: AC
  Filled 2023-04-25: qty 2

## 2023-04-25 MED ORDER — KETOROLAC TROMETHAMINE 15 MG/ML IJ SOLN
INTRAMUSCULAR | Status: AC | PRN
  Administered 2023-04-25 (×2): 15 mg via INTRAVENOUS

## 2023-04-25 MED ORDER — FENTANYL CITRATE (PF) 100 MCG/2ML IJ SOLN
INTRAMUSCULAR | Status: AC
  Filled 2023-04-25: qty 4

## 2023-04-25 MED ORDER — HYDRALAZINE HCL 20 MG/ML IJ SOLN: 5.0000 mg | INTRAMUSCULAR | Status: AC | PRN

## 2023-04-25 MED ORDER — HEPARIN SODIUM (PORCINE) 1000 UNIT/ML IJ SOLN
INTRAMUSCULAR | Status: AC
  Filled 2023-04-25: qty 10

## 2023-04-25 MED ORDER — LIDOCAINE-EPINEPHRINE 1 %-1:100000 IJ SOLN
INTRAMUSCULAR | Status: AC
  Filled 2023-04-25: qty 1

## 2023-04-25 NOTE — Consult Note (Signed)
Chief Complaint: Patient was seen in consultation today for Uterine artery embolization Chief Complaint  Patient presents with   Headache   Shortness of Breath   Weakness   at the request of Dr Antony Contras, Salena Saner    Supervising Physician: Roanna Banning  Patient Status: [facility] - In-pt  History of Present Illness: Sonya Garner is a 42 y.o. female   FULL Code status per pt Anemia secondary blood loss; Uterine fibroids IDA She has had heavy bleeding really since September Has been in hospital previously with low Hg- requiring transfusion Shortness of breath  Complicating issue: Unprovoked PE and On Eliquis since May 2024 Last menses cycle was 9/15--- still bleeding Going through several pads a day Hx tubal ligation Hg 3.9 upon admission 7.3 today; post 3 transfusions  Request made for Uterine artery embolization  MRI today:  1. Enhancing endometrial lesion centrally most adherent to the anterior wall, measuring 2.8 by 2.3 by 3.3 cm (volume = 11 cm^ 3), possibilities include polyp, endometrial carcinoma, or submucosal fibroid. Carcinoma less favored given the lack of substantial heterogeneity. Correlate with any endometrial biopsy results. 2. Multiple intramural fibroids are present. In general these are diffusely enhancing. 3. The uterine size and retroflexed configuration moderately limit the space for the urinary bladder in the pelvis  Dr Milford Cage and Dr Elby Showers have reviewed imaging and chart Dr Milford Cage has seen and examine pt  Planned for Uterine artery embolization in IR today  Past Medical History:  Diagnosis Date   Chronic kidney disease    kidney stone   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   Qualifier: Diagnosis of   By: Levada Schilling       Mild concussion 12/05/2016    Past Surgical History:  Procedure Laterality Date   TUBAL LIGATION      Allergies: Feraheme [ferumoxytol]  Medications: Prior to Admission medications   Medication Sig Start Date End  Date Taking? Authorizing Provider  acetaminophen (TYLENOL) 500 MG tablet Take 500-1,000 mg by mouth as needed for moderate pain.   Yes [provider]  apixaban (ELIQUIS) 5 MG TABS tablet Take 1 tablet (5 mg total) by mouth 2 (two) times daily. 04/01/23  Yes Rana Snare, DO  ferrous sulfate (FEROSUL) 325 (65 FE) MG tablet Take 1 tablet (325 mg total) by mouth daily for 30 days then as directed by MD Patient taking differently: Take 325 mg by mouth 2 (two) times daily with a meal. 03/22/23  Yes Atway, Rayann N, DO     Family History  Problem Relation Age of Onset   Breast cancer Maternal Aunt    Cancer Maternal Aunt        breast   Cancer Maternal Uncle    Breast cancer Maternal Grandmother    Cancer Maternal Grandmother    Hearing loss Neg Hx     Social History   Socioeconomic History   Marital status: Single    Spouse name: Not on file   Number of children: Not on file   Years of education: Not on file   Highest education level: Not on file  Occupational History   Not on file  Tobacco Use   Smoking status: Every Day    Current packs/day: 0.25    Average packs/day: 0.3 packs/day for 10.0 years (2.5 ttl pk-yrs)    Types: Cigarettes   Smokeless tobacco: Never   Tobacco comments:    Stopped 11/19/2022   Vaping Use   Vaping status: Some Days   Substances: Nicotine  Substance and Sexual Activity   Alcohol use: No   Drug use: No   Sexual activity: Yes    Birth control/protection: Surgical  Other Topics Concern   Not on file  Social History Narrative   Not on file   Social Determinants of Health   Financial Resource Strain: Not on file  Food Insecurity: No Food Insecurity (04/24/2023)   Hunger Vital Sign    Worried About Running Out of Food in the Last Year: Never true    Ran Out of Food in the Last Year: Never true  Transportation Needs: No Transportation Needs (04/24/2023)   PRAPARE - Administrator, Civil Service (Medical): No    Lack of  Transportation (Non-Medical): No  Physical Activity: Not on file  Stress: Not on file  Social Connections: Moderately Isolated (11/26/2022)   Social Connection and Isolation Panel [NHANES]    Frequency of Communication with Friends and Family: More than three times a week    Frequency of Social Gatherings with Friends and Family: More than three times a week    Attends Religious Services: Never    Database administrator or Organizations: No    Attends Banker Meetings: Never    Marital Status: Living with partner    Review of Systems: A 12 point ROS discussed and pertinent positives are indicated in the HPI above.  All other systems are negative.  Review of Systems  Constitutional:  Positive for fatigue. Negative for activity change and fever.  Respiratory:  Negative for cough and shortness of breath.   Cardiovascular:  Negative for leg swelling.  Gastrointestinal:  Negative for abdominal pain.  Genitourinary:  Positive for vaginal bleeding.  Neurological:  Negative for weakness.  Psychiatric/Behavioral:  Negative for behavioral problems and confusion.     Vital Signs: BP 109/61   Pulse 94   Temp 99 F (37.2 C) (Oral)   Resp 18   Ht 5\' 4"  (1.626 m)   Wt 154 lb (69.9 kg)   LMP 03/31/2023 Comment: has been bleeding continuous  SpO2 100%   BMI 26.43 kg/m     Physical Exam Vitals reviewed.  Cardiovascular:     Rate and Rhythm: Normal rate and regular rhythm.     Heart sounds: No murmur heard. Pulmonary:     Effort: Pulmonary effort is normal.     Breath sounds: Normal breath sounds. No wheezing.  Abdominal:     Palpations: Abdomen is soft.     Tenderness: There is no abdominal tenderness.  Musculoskeletal:        General: No swelling. Normal range of motion.  Skin:    General: Skin is warm.  Neurological:     Mental Status: She is alert.  Psychiatric:        Behavior: Behavior normal.     Imaging: MR PELVIS W WO CONTRAST  Result Date:  04/25/2023 CLINICAL DATA:  Uterine fibroids and prior hospitalizations for abnormal uterine bleeding. Currently on Eliquis due to pulmonary embolus. Preprocedural workup. EXAM: MRI PELVIS WITHOUT AND WITH CONTRAST TECHNIQUE: Multiplanar multisequence MR imaging of the pelvis was performed both before and after administration of intravenous contrast. CONTRAST:  6mL GADAVIST GADOBUTROL 1 MMOL/ML IV SOLN COMPARISON:  Pelvic ultrasound 02/06/2023 FINDINGS: Urinary Tract: The uterine size and retroflexed configuration moderately limit the space for the urinary bladder in the pelvis, as shown on image 15 series 10. Bowel: Unremarkable Vascular/Lymphatic:  Unremarkable Reproductive: Uterus: Measures 12.3 by 6.5 by 6.1 cm (volume =  260 cm^3). Retroflexed uterus Enhancing endometrial lesion centrally most adherent to the anterior wall, measuring 2.8 by 2.3 by 3.3 cm (volume = 11 cm^3), possibilities include polyp, endometrial carcinoma, or submucosal fibroid. Carcinoma less favored given the lack of substantial heterogeneity. Correlate with any prior endometrial biopsy results. Some all amount of surrounding endometrial fluid as shown on image 15 series 10. The endometrium appears otherwise unremarkable. Multiple intramural fibroids are present: Index intramural fibroid in the left uterine body is T2 hypointense with diffuse enhancement aside from a tiny central hypoenhancing focus on image 51 series 804, this lesion measures 2.7 by 2.3 by 2.4 cm (volume = 7.8 cm^3). A second intramural fibroid in the uterine fundus measures 2.6 by 2.1 by 2.6 cm (volume = 7.4 cm^3) on image 21 of series 5 likewise demonstrates diffuse enhancement and low T2 signal. Multiple additional uterine fibroids are observed. Multiple nabothian cysts along the cervix. On T2 weighted images, the junctional zone measures about 1.1 cm in thickness. Right ovary: Measures 5.2 by 3.1 by 3.6 cm (volume = 30 cm^3), enlarged, but with most of this due to a  simple appearing 4.3 by 2.8 by 3.1 cm (volume = 20 cm^3) right ovarian cyst. No follow up imaging of this cyst is recommended. Left ovary: 3.1 by 1.5 by 2.3 cm (volume = 5.6 cm^3). Appears unremarkable. Other: Trace and likely physiologic fluid along the cul-de-sac. Musculoskeletal: IMPRESSION: 1. Enhancing endometrial lesion centrally most adherent to the anterior wall, measuring 2.8 by 2.3 by 3.3 cm (volume = 11 cm^ 3), possibilities include polyp, endometrial carcinoma, or submucosal fibroid. Carcinoma less favored given the lack of substantial heterogeneity. Correlate with any endometrial biopsy results. 2. Multiple intramural fibroids are present. In general these are diffusely enhancing. 3. The uterine size and retroflexed configuration moderately limit the space for the urinary bladder in the pelvis. 4. A simple appearing right ovarian cyst does not require any further workup. 5. Trace and likely physiologic fluid along the cul-de-sac. Electronically Signed   By: Gaylyn Rong M.D.   On: 04/25/2023 13:40   VAS [place] LOWER EXTREMITY VENOUS (DVT) (7a-7p)  Result Date: 04/23/2023  Lower Venous DVT Study Patient Name:  KIARIA SCHARES  Date of Exam:   04/23/2023 Medical Rec #: [id number]         Accession #:    [id number] Date of Birth: [birth year]         Patient Gender: F Patient Age:   54 years Exam Location:  [facility] Procedure:      VAS [place] LOWER EXTREMITY VENOUS (DVT) Referring Phys: HAYLEY NAASZ --------------------------------------------------------------------------------  Indications: Edema, and Swelling.  Risk Factors: DVT Left leg 5/24. Anticoagulation: Eliquis. Comparison Study: Improved findings since previous exam 11/20/22 Performing Technologist: Shona Simpson  Examination Guidelines: A complete evaluation includes B-mode imaging, spectral Doppler, color Doppler, and power Doppler as needed of all accessible portions of each vessel. Bilateral testing is considered an integral part of a  complete examination. Limited examinations for reoccurring indications may be performed as noted. The reflux portion of the exam is performed with the patient in reverse Trendelenburg.  +-----+---------------+---------+-----------+----------+--------------+ RIGHTCompressibilityPhasicitySpontaneityPropertiesThrombus Aging +-----+---------------+---------+-----------+----------+--------------+ CFV  Full           Yes      Yes                                 +-----+---------------+---------+-----------+----------+--------------+   +---------+---------------+---------+-----------+----------------+-------------+ LEFT  CompressibilityPhasicitySpontaneityProperties      Thrombus                                                                  Aging         +---------+---------------+---------+-----------+----------------+-------------+ CFV      Full           Yes      Yes                        Chronic       +---------+---------------+---------+-----------+----------------+-------------+ SFJ      Full                                                             +---------+---------------+---------+-----------+----------------+-------------+ FV Prox  Full                                                             +---------+---------------+---------+-----------+----------------+-------------+ FV Mid   Full                                                             +---------+---------------+---------+-----------+----------------+-------------+ FV DistalFull                                                             +---------+---------------+---------+-----------+----------------+-------------+ PFV      Full                                                             +---------+---------------+---------+-----------+----------------+-------------+ POP      Partial        Yes      Yes        spongy          Chronic                                                    w/compression                 +---------+---------------+---------+-----------+----------------+-------------+ PTV      Full                                                             +---------+---------------+---------+-----------+----------------+-------------+  PERO     Full                                                             +---------+---------------+---------+-----------+----------------+-------------+ Soleal   Full                                                             +---------+---------------+---------+-----------+----------------+-------------+ Gastroc  Partial                            softly echogenicChronic       +---------+---------------+---------+-----------+----------------+-------------+     Summary: RIGHT: - No evidence of common femoral vein obstruction.   LEFT: - There is recanalized thrombus in the left popliteal vein(s). There is recanalized thrombus in the left popliteal vein(s). - No cystic structure found in the popliteal fossa.  *See table(s) above for measurements and observations. Electronically signed by Lemar Livings MD on 04/23/2023 at 6:04:00 PM.    Final    CT Angio Chest PE W and/or Wo Contrast  Result Date: 04/23/2023 CLINICAL DATA:  Shortness of breath EXAM: CT ANGIOGRAPHY CHEST WITH CONTRAST TECHNIQUE: Multidetector CT imaging of the chest was performed using the standard protocol during bolus administration of intravenous contrast. Multiplanar CT image reconstructions and MIPs were obtained to evaluate the vascular anatomy. RADIATION DOSE REDUCTION: This exam was performed according to the departmental dose-optimization program which includes automated exposure control, adjustment of the mA and/or kV according to patient size and/or use of iterative reconstruction technique. CONTRAST:  75mL OMNIPAQUE IOHEXOL 350 MG/ML SOLN COMPARISON:  CT angiogram chest 11/20/2022 FINDINGS: Cardiovascular: Heart is  nonenlarged. No pericardial effusion. The thoracic aorta has a normal course and caliber. There is breathing motion throughout the examination significantly limited evaluation for pulmonary emboli, nondiagnostic for small and peripheral emboli. No large or central embolus. The previous central emboli are no longer identified. Mediastinum/Nodes: Triangular-shaped soft tissue in the anterior superior mediastinum consistent with some residual thymus tissue. No separate abnormal lymph node enlargement identified in the axillary region, hilum or mediastinum. Normal caliber thoracic esophagus. Preserved thyroid gland. Lungs/Pleura: Lungs are clear. No pleural effusion or pneumothorax. Upper Abdomen: No acute abnormality. Musculoskeletal: Curvature of the spine. Review of the MIP images confirms the above findings. IMPRESSION: Previous pulmonary emboli are no longer clearly identified. No new emboli identified although there is significant breathing motion limited evaluation. Nondiagnostic for small and peripheral emboli. Electronically Signed   By: Karen Kays M.D.   On: 04/23/2023 11:55   DG Chest 1 View  Result Date: 04/23/2023 CLINICAL DATA:  Worsening shortness of breath EXAM: CHEST  1 VIEW COMPARISON:  03/07/2023 FINDINGS: The heart size and mediastinal contours are within normal limits. Both lungs are clear. The visualized skeletal structures are unremarkable except for similar slight dextroscoliosis of the spine. Lower chest nipple shadows noted. IMPRESSION: No active disease. Electronically Signed   By: Judie Petit.  Shick M.D.   On: 04/23/2023 09:10    Labs:  CBC: Recent Labs    03/07/23 1244 04/23/23 1110 04/24/23 0017 04/24/23 0434 04/24/23 1343 04/25/23  0645  WBC 4.7 8.5  --  6.6  --  10.5  HGB 10.2* 3.9* 6.7* 7.2* 7.5* 7.3*  HCT 32.8* 12.8* 20.3* 21.6* 23.4* 22.9*  PLT 296 251  --  212  --  237    COAGS: No results for input(s): "INR", "APTT" in the last 8760 hours.  BMP: Recent Labs     02/06/23 0629 03/07/23 1244 04/23/23 0829 04/24/23 0434  NA 141 138 136 137  K 3.6 3.6 3.6 3.7  CL 108 106 104 107  CO2 20* 24 19* 21*  GLUCOSE 107* 99 118* 101*  BUN 10 10 8 10   CALCIUM 9.0 9.0 8.4* 8.3*  CREATININE 0.75 0.87 0.80 0.67  GFRNONAA >60 >60 >60 >60    LIVER FUNCTION TESTS: Recent Labs    11/20/22 1045 04/23/23 0829  BILITOT 0.4 0.7  AST 20 29  ALT 25 25  ALKPHOS 41 32*  PROT 6.5 6.1*  ALBUMIN 3.3* 3.3*    TUMOR MARKERS: No results for input(s): "AFPTM", "CEA", "CA199", "CHROMGRNA" in the last 8760 hours.  Assessment and Plan:  Scheduled for Uterine artery embolization  Risks and benefits of Uterine artery embolization were discussed with the patient including, but not limited to bleeding, infection, vascular injury or contrast induced renal failure.  This interventional procedure involves the use of X-rays and because of the nature of the planned procedure, it is possible that we will have prolonged use of X-ray fluoroscopy.  Potential radiation risks to you include (but are not limited to) the following: - A slightly elevated risk for cancer  several years later in life. This risk is typically less than 0.5% percent. This risk is low in comparison to the normal incidence of human cancer, which is 33% for women and 50% for men according to the [employer] Cancer Society. - Radiation induced injury can include skin redness, resembling a rash, tissue breakdown / ulcers and hair loss (which can be temporary or permanent).   The likelihood of either of these occurring depends on the difficulty of the procedure and whether you are sensitive to radiation due to previous procedures, disease, or genetic conditions.   IF your procedure requires a prolonged use of radiation, you will be notified and given written instructions for further action.  It is your responsibility to monitor the irradiated area for the 2 weeks following the procedure and to notify your  physician if you are concerned that you have suffered a radiation induced injury.    All of the patient's questions were answered, patient is agreeable to proceed.  Consent signed and in chart.  Thank you for this interesting consult.  I greatly enjoyed meeting HELEM KWASNIK and look forward to participating in their care.  A copy of this report was sent to the requesting provider on this date.  Electronically Signed: Robet Leu, PA-C 04/25/2023, 2:10 PM   I spent a total of 20 Minutes    in face to face in clinical consultation, greater than 50% of which was counseling/coordinating care for Uterine artery embolization

## 2023-04-25 NOTE — Procedures (Signed)
Vascular and Interventional Radiology Procedure Note  Patient: Sonya Garner DOB: Nov 15, 1980 Medical Record Number: [id number] Note Date/Time: 04/25/23 3:43 PM   Performing Physician: Roanna Banning, MD Assistant(s): None  Diagnosis: Dysfunctional uterine bleeding. Anemia requiring infusions  Procedure(s):  PELVIC ARTERIOGRAPHY BILATERAL UTERINE ARTERY EMBOLIZATION for FIBROIDS   Anesthesia: Conscious Sedation Complications: None Estimated Blood Loss: Minimal Specimens: None  Findings:  - access via the LEFT radial artery. - Bilateral enlarged and tortuous uterine arteries. - Successful embolization with 500-700um and 700-900um microparticles, then Gelfoam  to stasis. - TR band closure at L wrist at the end of the case  Plan: - Post sheath removal precautions.  - Standard post radial access deflation protocol.   Final report to follow once all images are reviewed and compared with previous studies.  See detailed dictation with images in PACS. The patient tolerated the procedure well without incident or complication and was returned to Recovery in stable condition.    Roanna Banning, MD [facility]   Pager. [phone] Clinic. [phone]

## 2023-04-25 NOTE — Progress Notes (Addendum)
   Subjective:  Patient seen and examined at bedside. She states she is feeling well today, no return of her symptoms and no new symptoms. She notes her bleeding has slowed down. We discussed the plan for today, patient was without any questions.   Objective:  Vital signs in last 24 hours: Vitals:   04/24/23 1048 04/24/23 1246 04/24/23 2030 04/25/23 0537  BP: 104/62 120/73 105/75 110/65  Pulse: 72 84 96 81  Resp: 16 17 18 18   Temp: 97.8 F (36.6 C) 98.5 F (36.9 C) 98.4 F (36.9 C) 98 F (36.7 C)  TempSrc: Oral Oral Oral Oral  SpO2: 100% 100% 100% 99%  Weight:      Height:       Weight change:   Intake/Output Summary (Last 24 hours) at 04/25/2023 0258 Last data filed at 04/25/2023 0400 Gross per 24 hour  Intake 960 ml  Output 3 ml  Net 957 ml   Physical Examination:  General appearance - alert, well appearing, and in no distress; sitting up in bed very pleasant and covnersational Mental status - alert, oriented to person, place, and time Eyes - pupils equal and reactive, extraocular eye movements intact Mouth - mucous membranes moist, pharynx normal without lesions Chest - clear to auscultation, no wheezes, rales or rhonchi, symmetric air entry Heart - normal rate, regular rhythm, normal S1, S2, no murmurs, rubs, clicks or gallops Abdomen - soft, nontender, nondistended, no masses or organomegaly Extremities - peripheral pulses normal, no pedal edema, no clubbing or cyanosis  Assessment/Plan:  Principal Problem:   Symptomatic anemia   Acute blood loss anemia 2/2 Uterine fibroids Fe Deficiency Anemia Patient asymptomatic this morning. Vaginal bleeding has improved! She tolerated the IV iron infusion well yesterday. VS stable. Hgb 7.2 today. After a long discussion of bleeding management options, risks and benefits, through joint decision making patient elected to proceed with IR evaluation for uterine artery embolization. We spoke with both IR and OBGYN to ensure all  were in agreement with plan.  PLAN: - Pelvic MRI w wo contrast to characterize fibroids for IR planning - Consulted IR team - Continue to monitor H/H, repeat this afternoon - Will give another unit PRBC in anticipation of procedure, goal hgb >8 - Holding Eliquis, please see attestation from note 10/9 - Recommend repeat iron panel in 4-6 weeks with PCP - Referral placed for outpatient iron infusions  Chronic: - Hx of PE - holding eliquis. May consider resuming prophylactic dose following source control.  Diet: Normal IVF: None VTE: SCDs Code: Full   LOS: 0 days   Dorita Sciara, Medical Student 04/25/2023, 6:43 AM

## 2023-04-25 NOTE — Sedation Documentation (Signed)
Handoff given to [employer], Charity fundraiser. Pt had 11mL of air in TR Band. Left radial pulses present.

## 2023-04-25 NOTE — Plan of Care (Signed)
  Problem: Clinical Measurements: Goal: Respiratory complications will improve Outcome: Progressing Goal: Cardiovascular complication will be avoided Outcome: Progressing   Problem: Coping: Goal: Level of anxiety will decrease Outcome: Progressing   

## 2023-04-26 ENCOUNTER — Other Ambulatory Visit: Payer: Self-pay | Admitting: Radiology

## 2023-04-26 ENCOUNTER — Telehealth: Payer: Self-pay | Admitting: Lactation Services

## 2023-04-26 ENCOUNTER — Other Ambulatory Visit: Payer: Self-pay | Admitting: Pharmacy Technician

## 2023-04-26 ENCOUNTER — Encounter (HOSPITAL_COMMUNITY): Payer: Self-pay | Admitting: Internal Medicine

## 2023-04-26 DIAGNOSIS — N939 Abnormal uterine and vaginal bleeding, unspecified: Secondary | ICD-10-CM

## 2023-04-26 DIAGNOSIS — D259 Leiomyoma of uterus, unspecified: Secondary | ICD-10-CM | POA: Diagnosis not present

## 2023-04-26 DIAGNOSIS — D500 Iron deficiency anemia secondary to blood loss (chronic): Secondary | ICD-10-CM | POA: Diagnosis not present

## 2023-04-26 DIAGNOSIS — Z0289 Encounter for other administrative examinations: Secondary | ICD-10-CM

## 2023-04-26 LAB — TYPE AND SCREEN
ABO/RH(D): O POS
Antibody Screen: NEGATIVE
Unit division: 0
Unit division: 0
Unit division: 0

## 2023-04-26 LAB — CBC
HCT: 25.5 % — ABNORMAL LOW (ref 36.0–46.0)
Hemoglobin: 8.3 g/dL — ABNORMAL LOW (ref 12.0–15.0)
MCH: 27.3 pg (ref 26.0–34.0)
MCHC: 32.5 g/dL (ref 30.0–36.0)
MCV: 83.9 fL (ref 80.0–100.0)
Platelets: 194 10*3/uL (ref 150–400)
RBC: 3.04 MIL/uL — ABNORMAL LOW (ref 3.87–5.11)
RDW: 19.9 % — ABNORMAL HIGH (ref 11.5–15.5)
WBC: 15.5 10*3/uL — ABNORMAL HIGH (ref 4.0–10.5)
nRBC: 3 % — ABNORMAL HIGH (ref 0.0–0.2)

## 2023-04-26 LAB — BASIC METABOLIC PANEL
Anion gap: 8 (ref 5–15)
BUN: 8 mg/dL (ref 6–20)
CO2: 22 mmol/L (ref 22–32)
Calcium: 8.6 mg/dL — ABNORMAL LOW (ref 8.9–10.3)
Chloride: 109 mmol/L (ref 98–111)
Creatinine, Ser: 0.79 mg/dL (ref 0.44–1.00)
GFR, Estimated: >60 mL/min (ref >60)
Glucose, Bld: 104 mg/dL — ABNORMAL HIGH (ref 70–99)
Potassium: 3.6 mmol/L (ref 3.5–5.1)
Sodium: 139 mmol/L (ref 135–145)

## 2023-04-26 LAB — BPAM RBC
Blood Product Expiration Date: 202411042359
Blood Product Expiration Date: 202411042359
Blood Product Expiration Date: 202411052359
ISSUE DATE / TIME: 202410081327
ISSUE DATE / TIME: 202410081934
ISSUE DATE / TIME: 202410101022
Unit Type and Rh: 5100
Unit Type and Rh: 5100
Unit Type and Rh: 5100

## 2023-04-26 MED ORDER — LEUPROLIDE ACETATE 3.75 MG IM KIT
3.7500 mg | PACK | Freq: Once | INTRAMUSCULAR | 0 refills | Status: AC
Start: 2023-04-26 — End: 2023-04-26

## 2023-04-26 MED ORDER — OXYCODONE HCL 5 MG PO TABS
10.0000 mg | ORAL_TABLET | ORAL | Status: DC | PRN
  Administered 2023-04-26 – 2023-04-27 (×3): 10 mg via ORAL
  Filled 2023-04-26 (×3): qty 2

## 2023-04-26 NOTE — Plan of Care (Signed)
  Problem: Education: Goal: Knowledge of General Education information will improve Description: Including pain rating scale, medication(s)/side effects and non-pharmacologic comfort measures Outcome: Progressing   Problem: Health Behavior/Discharge Planning: Goal: Ability to manage health-related needs will improve Outcome: Progressing   Problem: Clinical Measurements: Goal: Ability to maintain clinical measurements within normal limits will improve Outcome: Progressing   Problem: Clinical Measurements: Goal: Will remain free from infection Outcome: Progressing   Problem: Clinical Measurements: Goal: Will remain free from infection Outcome: Progressing   Problem: Clinical Measurements: Goal: Diagnostic test results will improve Outcome: Progressing   Problem: Clinical Measurements: Goal: Respiratory complications will improve Outcome: Progressing   Problem: Clinical Measurements: Goal: Cardiovascular complication will be avoided Outcome: Progressing

## 2023-04-26 NOTE — Telephone Encounter (Signed)
See next telephone encounter 

## 2023-04-26 NOTE — Telephone Encounter (Signed)
Received message from [facility] that they received a message that they cannot fill RX as must be sent to [facility].   [employer] and spoke with Leta Jungling and was informed prescription has to be sent to [facility].  Rx was sent to [facility] in [place] as instructed by [place]. Will notify patient.

## 2023-04-26 NOTE — Plan of Care (Signed)
  Problem: Education: Goal: Knowledge of General Education information will improve Description: Including pain rating scale, medication(s)/side effects and non-pharmacologic comfort measures Outcome: Progressing   Problem: Clinical Measurements: Goal: Ability to maintain clinical measurements within normal limits will improve Outcome: Progressing Goal: Will remain free from infection Outcome: Progressing Goal: Respiratory complications will improve Outcome: Progressing Goal: Cardiovascular complication will be avoided Outcome: Progressing   Problem: Activity: Goal: Risk for activity intolerance will decrease Outcome: Progressing   Problem: Elimination: Goal: Will not experience complications related to urinary retention Outcome: Progressing   Problem: Safety: Goal: Ability to remain free from injury will improve Outcome: Progressing   Problem: Skin Integrity: Goal: Risk for impaired skin integrity will decrease Outcome: Progressing   Problem: Nutrition: Goal: Adequate nutrition will be maintained Outcome: Not Progressing   Problem: Elimination: Goal: Will not experience complications related to bowel motility Outcome: Not Progressing   Problem: Pain Managment: Goal: General experience of comfort will improve Outcome: Not Progressing

## 2023-04-26 NOTE — Progress Notes (Signed)
Referring Physician(s): Dr. Catalina Antigua  Supervising Physician: Roanna Banning  Patient Status:  [facility] - In-pt  Chief Complaint:  Dysfunctional uterine bleeding requiring transfusions.   Subjective:  Patient sitting up in bed. Husband at bedside. Patient states that her menstrual bleeding has lightened today.   Allergies: Feraheme [ferumoxytol]  Medications: Prior to Admission medications   Medication Sig Start Date End Date Taking? Authorizing Provider  acetaminophen (TYLENOL) 500 MG tablet Take 500-1,000 mg by mouth as needed for moderate pain.   Yes [provider]  apixaban (ELIQUIS) 5 MG TABS tablet Take 1 tablet (5 mg total) by mouth 2 (two) times daily. 04/01/23  Yes Rana Snare, DO  ferrous sulfate (FEROSUL) 325 (65 FE) MG tablet Take 1 tablet (325 mg total) by mouth daily for 30 days then as directed by MD Patient taking differently: Take 325 mg by mouth 2 (two) times daily with a meal. 03/22/23  Yes Atway, Rayann N, DO  leuprolide (LUPRON) 3.75 MG injection Inject 3.75 mg into the muscle once for 1 dose. 04/26/23 04/26/23  Reva Bores, MD     Vital Signs: BP 106/70 (BP Location: Right Arm)   Pulse 61   Temp 98.5 F (36.9 C) (Oral)   Resp 19   Ht 5\' 4"  (1.626 m)   Wt 157 lb (71.2 kg)   LMP 03/31/2023 Comment: has been bleeding continuous  SpO2 99%   BMI 26.95 kg/m   Physical Exam Vitals and nursing note reviewed.  Constitutional:      Appearance: She is well-developed.  HENT:     Head: Normocephalic and atraumatic.  Eyes:     Conjunctiva/sclera: Conjunctivae normal.  Cardiovascular:     Comments: left radial access site is soft with no active bleeding and no appreciable pseudoaneurysm. Dressing is C/D/I   Pulmonary:     Effort: Pulmonary effort is normal.  Musculoskeletal:        General: Normal range of motion.     Cervical back: Normal range of motion.  Skin:    General: Skin is warm.  Neurological:     Mental Status: She is  alert and oriented to person, place, and time.     Imaging: IR Angiogram Pelvis Selective Or Supraselective  Result Date: 04/26/2023 INDICATION: Dysfunctional uterine bleeding. Patient with PE on anticoagulation. Severe anemia. Inpatient admission. EXAM: Title: BILATERAL UTERINE ARTERY EMBOLIZATION for fibroids Procedures: 1. Ultrasound-guided access of the LEFT radial artery 2. Catheterization angiography of the bilateral internal iliac arteries 3. Selective catheterization angiography of the bilateral uterine arteries 4. Embolization of the bilateral uterine arteries MEDICATIONS: Ancef 2 gm IV. The antibiotic was administered within one hour of the procedure Radial cocktail; 2 mg verapamil, 200 mcg nitroglycerin and 3K units heparin IV. 30 mg Toradol IV.  4 mg Zofran IV. ANESTHESIA/SEDATION: Moderate (conscious) sedation was employed during this procedure. A total of Versed 4 mg and Fentanyl 200 mcg was administered intravenously. Moderate Sedation Time: 56 minutes. The patient's level of consciousness and vital signs were monitored continuously by radiology nursing throughout the procedure under my direct supervision. CONTRAST:  50mL OMNIPAQUE IOHEXOL 300 MG/ML  SOLN FLUOROSCOPY TIME:  Fluoroscopic dose; 135 mGy COMPLICATIONS: None immediate. PROCEDURE: Informed consent was obtained from the patient following explanation of the procedure, risks, benefits and alternatives. The patient understands, agrees and consents for the procedure. All questions were addressed. A time out was performed prior to the initiation of the procedure. Maximal barrier sterile technique utilized including caps, mask, sterile  gowns, sterile gloves, large sterile drape, hand hygiene, and chlorhexidine prep. A preliminary ultrasound of the LEFT wrist was performed and demonstrates a patent radial artery. A permanent ultrasound image was recorded. The radial artery was measured for adequate size and a Barbeau test was performed  deemed in the left radial artery appropriate vascular access. The overlying skin was anesthetized with 1% lidocaine. Using ultrasound guidance, access into the left radial artery was obtained with visualization of needle entry into the vessel using standard micropuncture technique. A 5 French slender sheath was placed without complication. A standard radial cocktail was slowly administered. A 5 Fr Cook vertebral catheter and a Bentson wire were directed under direct fluoroscopic guidance through the left upper extremity and to the left subclavian artery and descending thoracic aorta. The catheter was used to select the RIGHT common iliac artery and subsequently the internal iliac artery. Angiography was performed which demonstrated a dominant, enlarged and tortuous uterine artery. The catheter was advanced over a Glidewire to select the proximal RIGHT uterine artery. A 2.8 Fr renegade Hi Flo microcatheter and Fathom 16 microwire were then used to select the RIGHT uterine artery. Selective angiography demonstrates filling of the uterine artery and hypervascular masses compatible with patient's known fibroids. No extra uterine branches were seen. Embolization of the uterine artery was performed using 500-700, 700-900 um Embospheres and Gelfoam slurry. Post embolization angiography demonstrates sluggish flow with no evidence of filling to the uterine fibroids. The vertebral catheter was then withdrawn and used to select the LEFT common iliac artery and LEFT internal iliac artery. Angiography was performed which demonstrated a tortuous uterine artery. No extra uterine branches were seen. Embolization of the uterine artery was performed using 500-700 um Embospheres and Gelfoam slurry. Post embolization angiography demonstrates sluggish flow with no evidence of filling to the uterine fibroids. At this point, all wires, catheters and sheaths were removed from the patient. A TR band was placed over the LEFT wrist and after  insufflation the sheath was removed. The TR band balloon was slightly deflated and then re-inflated to achieve patent hemostasis. The patient tolerated the procedure well without immediate complication was transferred to the recovery area in good condition. FINDINGS: *Bilateral internal iliac arteriograms demonstrates conventional branching pattern with widely patent origins of the bilateral uterine arteries. *Sub selective bilateral uterine arteriograms demonstrate codominant supply to the markedly-enlarged myomatous uterus. *Completion arteriograms following Gelfoam only embolization demonstrate a technically excellent result with stasis of flow and cross-filling of particulate within the uterine vascular territories. IMPRESSION: Successful bilateral uterine artery embolization for refractory and dysfunctional uterine bleeding secondary to uterine fibroids, via a transradial approach. PLAN: The patient will return to Vascular Interventional Radiology (VIR) for routine clinic follow-up in 4 weeks. Roanna Banning, MD [facility] Radiology Electronically Signed   By: Roanna Banning M.D.   On: 04/26/2023 10:29   IR Angiogram Selective Each Additional Vessel  Result Date: 04/26/2023 INDICATION: Dysfunctional uterine bleeding. Patient with PE on anticoagulation. Severe anemia. Inpatient admission. EXAM: Title: BILATERAL UTERINE ARTERY EMBOLIZATION for fibroids Procedures: 1. Ultrasound-guided access of the LEFT radial artery 2. Catheterization angiography of the bilateral internal iliac arteries 3. Selective catheterization angiography of the bilateral uterine arteries 4. Embolization of the bilateral uterine arteries MEDICATIONS: Ancef 2 gm IV. The antibiotic was administered within one hour of the procedure Radial cocktail; 2 mg verapamil, 200 mcg nitroglycerin and 3K units heparin IV. 30 mg Toradol IV.  4 mg Zofran IV. ANESTHESIA/SEDATION: Moderate (conscious) sedation  was employed during this procedure. A total of Versed 4 mg and Fentanyl 200 mcg was administered intravenously. Moderate Sedation Time: 56 minutes. The patient's level of consciousness and vital signs were monitored continuously by radiology nursing throughout the procedure under my direct supervision. CONTRAST:  50mL OMNIPAQUE IOHEXOL 300 MG/ML  SOLN FLUOROSCOPY TIME:  Fluoroscopic dose; 135 mGy COMPLICATIONS: None immediate. PROCEDURE: Informed consent was obtained from the patient following explanation of the procedure, risks, benefits and alternatives. The patient understands, agrees and consents for the procedure. All questions were addressed. A time out was performed prior to the initiation of the procedure. Maximal barrier sterile technique utilized including caps, mask, sterile gowns, sterile gloves, large sterile drape, hand hygiene, and chlorhexidine prep. A preliminary ultrasound of the LEFT wrist was performed and demonstrates a patent radial artery. A permanent ultrasound image was recorded. The radial artery was measured for adequate size and a Barbeau test was performed deemed in the left radial artery appropriate vascular access. The overlying skin was anesthetized with 1% lidocaine. Using ultrasound guidance, access into the left radial artery was obtained with visualization of needle entry into the vessel using standard micropuncture technique. A 5 French slender sheath was placed without complication. A standard radial cocktail was slowly administered. A 5 Fr Cook vertebral catheter and a Bentson wire were directed under direct fluoroscopic guidance through the left upper extremity and to the left subclavian artery and descending thoracic aorta. The catheter was used to select the RIGHT common iliac artery and subsequently the internal iliac artery. Angiography was performed which demonstrated a dominant, enlarged and tortuous uterine artery. The catheter was advanced over a Glidewire to select  the proximal RIGHT uterine artery. A 2.8 Fr renegade Hi Flo microcatheter and Fathom 16 microwire were then used to select the RIGHT uterine artery. Selective angiography demonstrates filling of the uterine artery and hypervascular masses compatible with patient's known fibroids. No extra uterine branches were seen. Embolization of the uterine artery was performed using 500-700, 700-900 um Embospheres and Gelfoam slurry. Post embolization angiography demonstrates sluggish flow with no evidence of filling to the uterine fibroids. The vertebral catheter was then withdrawn and used to select the LEFT common iliac artery and LEFT internal iliac artery. Angiography was performed which demonstrated a tortuous uterine artery. No extra uterine branches were seen. Embolization of the uterine artery was performed using 500-700 um Embospheres and Gelfoam slurry. Post embolization angiography demonstrates sluggish flow with no evidence of filling to the uterine fibroids. At this point, all wires, catheters and sheaths were removed from the patient. A TR band was placed over the LEFT wrist and after insufflation the sheath was removed. The TR band balloon was slightly deflated and then re-inflated to achieve patent hemostasis. The patient tolerated the procedure well without immediate complication was transferred to the recovery area in good condition. FINDINGS: *Bilateral internal iliac arteriograms demonstrates conventional branching pattern with widely patent origins of the bilateral uterine arteries. *Sub selective bilateral uterine arteriograms demonstrate codominant supply to the markedly-enlarged myomatous uterus. *Completion arteriograms following Gelfoam only embolization demonstrate a technically excellent result with stasis of flow and cross-filling of particulate within the uterine vascular territories. IMPRESSION: Successful bilateral uterine artery embolization for refractory and dysfunctional uterine bleeding  secondary to uterine fibroids, via a transradial approach. PLAN: The patient will return to Vascular Interventional Radiology (VIR) for routine clinic follow-up in 4 weeks. Roanna Banning, MD [facility] Radiology Electronically Signed   By: Gerome Sam.D.  On: 04/26/2023 10:29   IR US Guide Vasc Access Left  Result Date: 04/26/2023 INDICATION: Dysfunctional uterine bleeding. Patient with PE on anticoagulation. Severe anemia. Inpatient admission. EXAM: Title: BILATERAL UTERINE ARTERY EMBOLIZATION for fibroids Procedures: 1. Ultrasound-guided access of the LEFT radial artery 2. Catheterization angiography of the bilateral internal iliac arteries 3. Selective catheterization angiography of the bilateral uterine arteries 4. Embolization of the bilateral uterine arteries MEDICATIONS: Ancef 2 gm IV. The antibiotic was administered within one hour of the procedure Radial cocktail; 2 mg verapamil, 200 mcg nitroglycerin and 3K units heparin IV. 30 mg Toradol IV.  4 mg Zofran IV. ANESTHESIA/SEDATION: Moderate (conscious) sedation was employed during this procedure. A total of Versed 4 mg and Fentanyl 200 mcg was administered intravenously. Moderate Sedation Time: 56 minutes. The patient's level of consciousness and vital signs were monitored continuously by radiology nursing throughout the procedure under my direct supervision. CONTRAST:  50mL OMNIPAQUE IOHEXOL 300 MG/ML  SOLN FLUOROSCOPY TIME:  Fluoroscopic dose; 135 mGy COMPLICATIONS: None immediate. PROCEDURE: Informed consent was obtained from the patient following explanation of the procedure, risks, benefits and alternatives. The patient understands, agrees and consents for the procedure. All questions were addressed. A time out was performed prior to the initiation of the procedure. Maximal barrier sterile technique utilized including caps, mask, sterile gowns, sterile gloves, large sterile drape, hand hygiene,  and chlorhexidine prep. A preliminary ultrasound of the LEFT wrist was performed and demonstrates a patent radial artery. A permanent ultrasound image was recorded. The radial artery was measured for adequate size and a Barbeau test was performed deemed in the left radial artery appropriate vascular access. The overlying skin was anesthetized with 1% lidocaine. Using ultrasound guidance, access into the left radial artery was obtained with visualization of needle entry into the vessel using standard micropuncture technique. A 5 French slender sheath was placed without complication. A standard radial cocktail was slowly administered. A 5 Fr Cook vertebral catheter and a Bentson wire were directed under direct fluoroscopic guidance through the left upper extremity and to the left subclavian artery and descending thoracic aorta. The catheter was used to select the RIGHT common iliac artery and subsequently the internal iliac artery. Angiography was performed which demonstrated a dominant, enlarged and tortuous uterine artery. The catheter was advanced over a Glidewire to select the proximal RIGHT uterine artery. A 2.8 Fr renegade Hi Flo microcatheter and Fathom 16 microwire were then used to select the RIGHT uterine artery. Selective angiography demonstrates filling of the uterine artery and hypervascular masses compatible with patient's known fibroids. No extra uterine branches were seen. Embolization of the uterine artery was performed using 500-700, 700-900 um Embospheres and Gelfoam slurry. Post embolization angiography demonstrates sluggish flow with no evidence of filling to the uterine fibroids. The vertebral catheter was then withdrawn and used to select the LEFT common iliac artery and LEFT internal iliac artery. Angiography was performed which demonstrated a tortuous uterine artery. No extra uterine branches were seen. Embolization of the uterine artery was performed using 500-700 um Embospheres and Gelfoam  slurry. Post embolization angiography demonstrates sluggish flow with no evidence of filling to the uterine fibroids. At this point, all wires, catheters and sheaths were removed from the patient. A TR band was placed over the LEFT wrist and after insufflation the sheath was removed. The TR band balloon was slightly deflated and then re-inflated to achieve patent hemostasis. The patient tolerated the procedure well without immediate complication was transferred to the recovery area in good condition.  FINDINGS: *Bilateral internal iliac arteriograms demonstrates conventional branching pattern with widely patent origins of the bilateral uterine arteries. *Sub selective bilateral uterine arteriograms demonstrate codominant supply to the markedly-enlarged myomatous uterus. *Completion arteriograms following Gelfoam only embolization demonstrate a technically excellent result with stasis of flow and cross-filling of particulate within the uterine vascular territories. IMPRESSION: Successful bilateral uterine artery embolization for refractory and dysfunctional uterine bleeding secondary to uterine fibroids, via a transradial approach. PLAN: The patient will return to Vascular Interventional Radiology (VIR) for routine clinic follow-up in 4 weeks. Roanna Banning, MD [facility] Radiology Electronically Signed   By: Roanna Banning M.D.   On: 04/26/2023 10:29   IR ABD/PEL BILAT ART BRANCH 3RD ORDER  Result Date: 04/26/2023 INDICATION: Dysfunctional uterine bleeding. Patient with PE on anticoagulation. Severe anemia. Inpatient admission. EXAM: Title: BILATERAL UTERINE ARTERY EMBOLIZATION for fibroids Procedures: 1. Ultrasound-guided access of the LEFT radial artery 2. Catheterization angiography of the bilateral internal iliac arteries 3. Selective catheterization angiography of the bilateral uterine arteries 4. Embolization of the bilateral uterine arteries MEDICATIONS: Ancef  2 gm IV. The antibiotic was administered within one hour of the procedure Radial cocktail; 2 mg verapamil, 200 mcg nitroglycerin and 3K units heparin IV. 30 mg Toradol IV.  4 mg Zofran IV. ANESTHESIA/SEDATION: Moderate (conscious) sedation was employed during this procedure. A total of Versed 4 mg and Fentanyl 200 mcg was administered intravenously. Moderate Sedation Time: 56 minutes. The patient's level of consciousness and vital signs were monitored continuously by radiology nursing throughout the procedure under my direct supervision. CONTRAST:  50mL OMNIPAQUE IOHEXOL 300 MG/ML  SOLN FLUOROSCOPY TIME:  Fluoroscopic dose; 135 mGy COMPLICATIONS: None immediate. PROCEDURE: Informed consent was obtained from the patient following explanation of the procedure, risks, benefits and alternatives. The patient understands, agrees and consents for the procedure. All questions were addressed. A time out was performed prior to the initiation of the procedure. Maximal barrier sterile technique utilized including caps, mask, sterile gowns, sterile gloves, large sterile drape, hand hygiene, and chlorhexidine prep. A preliminary ultrasound of the LEFT wrist was performed and demonstrates a patent radial artery. A permanent ultrasound image was recorded. The radial artery was measured for adequate size and a Barbeau test was performed deemed in the left radial artery appropriate vascular access. The overlying skin was anesthetized with 1% lidocaine. Using ultrasound guidance, access into the left radial artery was obtained with visualization of needle entry into the vessel using standard micropuncture technique. A 5 French slender sheath was placed without complication. A standard radial cocktail was slowly administered. A 5 Fr Cook vertebral catheter and a Bentson wire were directed under direct fluoroscopic guidance through the left upper extremity and to the left subclavian artery and descending thoracic aorta. The catheter was  used to select the RIGHT common iliac artery and subsequently the internal iliac artery. Angiography was performed which demonstrated a dominant, enlarged and tortuous uterine artery. The catheter was advanced over a Glidewire to select the proximal RIGHT uterine artery. A 2.8 Fr renegade Hi Flo microcatheter and Fathom 16 microwire were then used to select the RIGHT uterine artery. Selective angiography demonstrates filling of the uterine artery and hypervascular masses compatible with patient's known fibroids. No extra uterine branches were seen. Embolization of the uterine artery was performed using 500-700, 700-900 um Embospheres and Gelfoam slurry. Post embolization angiography demonstrates sluggish flow with no evidence of filling to the uterine fibroids. The vertebral catheter was then withdrawn and used to select the  LEFT common iliac artery and LEFT internal iliac artery. Angiography was performed which demonstrated a tortuous uterine artery. No extra uterine branches were seen. Embolization of the uterine artery was performed using 500-700 um Embospheres and Gelfoam slurry. Post embolization angiography demonstrates sluggish flow with no evidence of filling to the uterine fibroids. At this point, all wires, catheters and sheaths were removed from the patient. A TR band was placed over the LEFT wrist and after insufflation the sheath was removed. The TR band balloon was slightly deflated and then re-inflated to achieve patent hemostasis. The patient tolerated the procedure well without immediate complication was transferred to the recovery area in good condition. FINDINGS: *Bilateral internal iliac arteriograms demonstrates conventional branching pattern with widely patent origins of the bilateral uterine arteries. *Sub selective bilateral uterine arteriograms demonstrate codominant supply to the markedly-enlarged myomatous uterus. *Completion arteriograms following Gelfoam only embolization demonstrate a  technically excellent result with stasis of flow and cross-filling of particulate within the uterine vascular territories. IMPRESSION: Successful bilateral uterine artery embolization for refractory and dysfunctional uterine bleeding secondary to uterine fibroids, via a transradial approach. PLAN: The patient will return to Vascular Interventional Radiology (VIR) for routine clinic follow-up in 4 weeks. Roanna Banning, MD [facility] Inter[facility]iology [facility] Radiology Electronically Signed   By: Roanna Banning M.D.   On: 04/26/2023 10:29   IR EMBO ART  VEN HEMORR LYMPH EXTRAV  INC GUIDE ROADMAPPING  Result Date: 04/26/2023 INDICATION: Dysfunctional uterine bleeding. Patient with PE on anticoagulation. Severe anemia. Inpatient admission. EXAM: Title: BILATERAL UTERINE ARTERY EMBOLIZATION for fibroids Procedures: 1. Ultrasound-guided access of the LEFT radial artery 2. Catheterization angiography of the bilateral internal iliac arteries 3. Selective catheterization angiography of the bilateral uterine arteries 4. Embolization of the bilateral uterine arteries MEDICATIONS: Ancef 2 gm IV. The antibiotic was administered within one hour of the procedure Radial cocktail; 2 mg verapamil, 200 mcg nitroglycerin and 3K units heparin IV. 30 mg Toradol IV.  4 mg Zofran IV. ANESTHESIA/SEDATION: Moderate (conscious) sedation was employed during this procedure. A total of Versed 4 mg and Fentanyl 200 mcg was administered intravenously. Moderate Sedation Time: 56 minutes. The patient's level of consciousness and vital signs were monitored continuously by radiology nursing throughout the procedure under my direct supervision. CONTRAST:  50mL OMNIPAQUE IOHEXOL 300 MG/ML  SOLN FLUOROSCOPY TIME:  Fluoroscopic dose; 135 mGy COMPLICATIONS: None immediate. PROCEDURE: Informed consent was obtained from the patient following explanation of the procedure, risks, benefits and alternatives. The patient understands,  agrees and consents for the procedure. All questions were addressed. A time out was performed prior to the initiation of the procedure. Maximal barrier sterile technique utilized including caps, mask, sterile gowns, sterile gloves, large sterile drape, hand hygiene, and chlorhexidine prep. A preliminary ultrasound of the LEFT wrist was performed and demonstrates a patent radial artery. A permanent ultrasound image was recorded. The radial artery was measured for adequate size and a Barbeau test was performed deemed in the left radial artery appropriate vascular access. The overlying skin was anesthetized with 1% lidocaine. Using ultrasound guidance, access into the left radial artery was obtained with visualization of needle entry into the vessel using standard micropuncture technique. A 5 French slender sheath was placed without complication. A standard radial cocktail was slowly administered. A 5 Fr Cook vertebral catheter and a Bentson wire were directed under direct fluoroscopic guidance through the left upper extremity and to the left subclavian artery and descending thoracic aorta. The catheter was used to select the  RIGHT common iliac artery and subsequently the internal iliac artery. Angiography was performed which demonstrated a dominant, enlarged and tortuous uterine artery. The catheter was advanced over a Glidewire to select the proximal RIGHT uterine artery. A 2.8 Fr renegade Hi Flo microcatheter and Fathom 16 microwire were then used to select the RIGHT uterine artery. Selective angiography demonstrates filling of the uterine artery and hypervascular masses compatible with patient's known fibroids. No extra uterine branches were seen. Embolization of the uterine artery was performed using 500-700, 700-900 um Embospheres and Gelfoam slurry. Post embolization angiography demonstrates sluggish flow with no evidence of filling to the uterine fibroids. The vertebral catheter was then withdrawn and used to  select the LEFT common iliac artery and LEFT internal iliac artery. Angiography was performed which demonstrated a tortuous uterine artery. No extra uterine branches were seen. Embolization of the uterine artery was performed using 500-700 um Embospheres and Gelfoam slurry. Post embolization angiography demonstrates sluggish flow with no evidence of filling to the uterine fibroids. At this point, all wires, catheters and sheaths were removed from the patient. A TR band was placed over the LEFT wrist and after insufflation the sheath was removed. The TR band balloon was slightly deflated and then re-inflated to achieve patent hemostasis. The patient tolerated the procedure well without immediate complication was transferred to the recovery area in good condition. FINDINGS: *Bilateral internal iliac arteriograms demonstrates conventional branching pattern with widely patent origins of the bilateral uterine arteries. *Sub selective bilateral uterine arteriograms demonstrate codominant supply to the markedly-enlarged myomatous uterus. *Completion arteriograms following Gelfoam only embolization demonstrate a technically excellent result with stasis of flow and cross-filling of particulate within the uterine vascular territories. IMPRESSION: Successful bilateral uterine artery embolization for refractory and dysfunctional uterine bleeding secondary to uterine fibroids, via a transradial approach. PLAN: The patient will return to Vascular Interventional Radiology (VIR) for routine clinic follow-up in 4 weeks. Roanna Banning, MD [facility] Electronically Signed   By: Roanna Banning M.D.   On: 04/26/2023 10:29   IR Angiogram Selective Each Additional Vessel  Result Date: 04/26/2023 INDICATION: Dysfunctional uterine bleeding. Patient with PE on anticoagulation. Severe anemia. Inpatient admission. EXAM: Title: BILATERAL UTERINE ARTERY EMBOLIZATION for fibroids  Procedures: 1. Ultrasound-guided access of the LEFT radial artery 2. Catheterization angiography of the bilateral internal iliac arteries 3. Selective catheterization angiography of the bilateral uterine arteries 4. Embolization of the bilateral uterine arteries MEDICATIONS: Ancef 2 gm IV. The antibiotic was administered within one hour of the procedure Radial cocktail; 2 mg verapamil, 200 mcg nitroglycerin and 3K units heparin IV. 30 mg Toradol IV.  4 mg Zofran IV. ANESTHESIA/SEDATION: Moderate (conscious) sedation was employed during this procedure. A total of Versed 4 mg and Fentanyl 200 mcg was administered intravenously. Moderate Sedation Time: 56 minutes. The patient's level of consciousness and vital signs were monitored continuously by radiology nursing throughout the procedure under my direct supervision. CONTRAST:  50mL OMNIPAQUE IOHEXOL 300 MG/ML  SOLN FLUOROSCOPY TIME:  Fluoroscopic dose; 135 mGy COMPLICATIONS: None immediate. PROCEDURE: Informed consent was obtained from the patient following explanation of the procedure, risks, benefits and alternatives. The patient understands, agrees and consents for the procedure. All questions were addressed. A time out was performed prior to the initiation of the procedure. Maximal barrier sterile technique utilized including caps, mask, sterile gowns, sterile gloves, large sterile drape, hand hygiene, and chlorhexidine prep. A preliminary ultrasound of the LEFT wrist was performed and demonstrates a patent radial artery. A  permanent ultrasound image was recorded. The radial artery was measured for adequate size and a Barbeau test was performed deemed in the left radial artery appropriate vascular access. The overlying skin was anesthetized with 1% lidocaine. Using ultrasound guidance, access into the left radial artery was obtained with visualization of needle entry into the vessel using standard micropuncture technique. A 5 French slender sheath was placed  without complication. A standard radial cocktail was slowly administered. A 5 Fr Cook vertebral catheter and a Bentson wire were directed under direct fluoroscopic guidance through the left upper extremity and to the left subclavian artery and descending thoracic aorta. The catheter was used to select the RIGHT common iliac artery and subsequently the internal iliac artery. Angiography was performed which demonstrated a dominant, enlarged and tortuous uterine artery. The catheter was advanced over a Glidewire to select the proximal RIGHT uterine artery. A 2.8 Fr renegade Hi Flo microcatheter and Fathom 16 microwire were then used to select the RIGHT uterine artery. Selective angiography demonstrates filling of the uterine artery and hypervascular masses compatible with patient's known fibroids. No extra uterine branches were seen. Embolization of the uterine artery was performed using 500-700, 700-900 um Embospheres and Gelfoam slurry. Post embolization angiography demonstrates sluggish flow with no evidence of filling to the uterine fibroids. The vertebral catheter was then withdrawn and used to select the LEFT common iliac artery and LEFT internal iliac artery. Angiography was performed which demonstrated a tortuous uterine artery. No extra uterine branches were seen. Embolization of the uterine artery was performed using 500-700 um Embospheres and Gelfoam slurry. Post embolization angiography demonstrates sluggish flow with no evidence of filling to the uterine fibroids. At this point, all wires, catheters and sheaths were removed from the patient. A TR band was placed over the LEFT wrist and after insufflation the sheath was removed. The TR band balloon was slightly deflated and then re-inflated to achieve patent hemostasis. The patient tolerated the procedure well without immediate complication was transferred to the recovery area in good condition. FINDINGS: *Bilateral internal iliac arteriograms demonstrates  conventional branching pattern with widely patent origins of the bilateral uterine arteries. *Sub selective bilateral uterine arteriograms demonstrate codominant supply to the markedly-enlarged myomatous uterus. *Completion arteriograms following Gelfoam only embolization demonstrate a technically excellent result with stasis of flow and cross-filling of particulate within the uterine vascular territories. IMPRESSION: Successful bilateral uterine artery embolization for refractory and dysfunctional uterine bleeding secondary to uterine fibroids, via a transradial approach. PLAN: The patient will return to Vascular Interventional Radiology (VIR) for routine clinic follow-up in 4 weeks. Roanna Banning, MD [facility] Electronically Signed   By: Roanna Banning M.D.   On: 04/26/2023 10:29   MR PELVIS W WO CONTRAST  Result Date: 04/25/2023 CLINICAL DATA:  Uterine fibroids and prior hospitalizations for abnormal uterine bleeding. Currently on Eliquis due to pulmonary embolus. Preprocedural workup. EXAM: MRI PELVIS WITHOUT AND WITH CONTRAST TECHNIQUE: Multiplanar multisequence MR imaging of the pelvis was performed both before and after administration of intravenous contrast. CONTRAST:  6mL GADAVIST GADOBUTROL 1 MMOL/ML IV SOLN COMPARISON:  Pelvic ultrasound 02/06/2023 FINDINGS: Urinary Tract: The uterine size and retroflexed configuration moderately limit the space for the urinary bladder in the pelvis, as shown on image 15 series 10. Bowel: Unremarkable Vascular/Lymphatic:  Unremarkable Reproductive: Uterus: Measures 12.3 by 6.5 by 6.1 cm (volume = 260 cm^3). Retroflexed uterus Enhancing endometrial lesion centrally most adherent to the anterior wall, measuring 2.8 by 2.3 by 3.3 cm (volume =  11 cm^3), possibilities include polyp, endometrial carcinoma, or submucosal fibroid. Carcinoma less favored given the lack of substantial heterogeneity. Correlate with any  prior endometrial biopsy results. Some all amount of surrounding endometrial fluid as shown on image 15 series 10. The endometrium appears otherwise unremarkable. Multiple intramural fibroids are present: Index intramural fibroid in the left uterine body is T2 hypointense with diffuse enhancement aside from a tiny central hypoenhancing focus on image 51 series 804, this lesion measures 2.7 by 2.3 by 2.4 cm (volume = 7.8 cm^3). A second intramural fibroid in the uterine fundus measures 2.6 by 2.1 by 2.6 cm (volume = 7.4 cm^3) on image 21 of series 5 likewise demonstrates diffuse enhancement and low T2 signal. Multiple additional uterine fibroids are observed. Multiple nabothian cysts along the cervix. On T2 weighted images, the junctional zone measures about 1.1 cm in thickness. Right ovary: Measures 5.2 by 3.1 by 3.6 cm (volume = 30 cm^3), enlarged, but with most of this due to a simple appearing 4.3 by 2.8 by 3.1 cm (volume = 20 cm^3) right ovarian cyst. No follow up imaging of this cyst is recommended. Left ovary: 3.1 by 1.5 by 2.3 cm (volume = 5.6 cm^3). Appears unremarkable. Other: Trace and likely physiologic fluid along the cul-de-sac. Musculoskeletal: IMPRESSION: 1. Enhancing endometrial lesion centrally most adherent to the anterior wall, measuring 2.8 by 2.3 by 3.3 cm (volume = 11 cm^ 3), possibilities include polyp, endometrial carcinoma, or submucosal fibroid. Carcinoma less favored given the lack of substantial heterogeneity. Correlate with any endometrial biopsy results. 2. Multiple intramural fibroids are present. In general these are diffusely enhancing. 3. The uterine size and retroflexed configuration moderately limit the space for the urinary bladder in the pelvis. 4. A simple appearing right ovarian cyst does not require any further workup. 5. Trace and likely physiologic fluid along the cul-de-sac. Electronically Signed   By: Gaylyn Rong M.D.   On: 04/25/2023 13:40   VAS [place] LOWER  EXTREMITY VENOUS (DVT) (7a-7p)  Result Date: 04/23/2023  Lower Venous DVT Study Patient Name:  Sonya Garner  Date of Exam:   04/23/2023 Medical Rec #: [id number]         Accession #:    [id number] Date of Birth: Sep 05, 1980         Patient Gender: F Patient Age:   14 years Exam Location:  [facility] Procedure:      VAS [place] LOWER EXTREMITY VENOUS (DVT) Referring Phys: HAYLEY NAASZ --------------------------------------------------------------------------------  Indications: Edema, and Swelling.  Risk Factors: DVT Left leg 5/24. Anticoagulation: Eliquis. Comparison Study: Improved findings since previous exam 11/20/22 Performing Technologist: Shona Simpson  Examination Guidelines: A complete evaluation includes B-mode imaging, spectral Doppler, color Doppler, and power Doppler as needed of all accessible portions of each vessel. Bilateral testing is considered an integral part of a complete examination. Limited examinations for reoccurring indications may be performed as noted. The reflux portion of the exam is performed with the patient in reverse Trendelenburg.  +-----+---------------+---------+-----------+----------+--------------+ RIGHTCompressibilityPhasicitySpontaneityPropertiesThrombus Aging +-----+---------------+---------+-----------+----------+--------------+ CFV  Full           Yes      Yes                                 +-----+---------------+---------+-----------+----------+--------------+   +---------+---------------+---------+-----------+----------------+-------------+ LEFT     CompressibilityPhasicitySpontaneityProperties      Thrombus  Aging         +---------+---------------+---------+-----------+----------------+-------------+ CFV      Full           Yes      Yes                        Chronic       +---------+---------------+---------+-----------+----------------+-------------+ SFJ       Full                                                             +---------+---------------+---------+-----------+----------------+-------------+ FV Prox  Full                                                             +---------+---------------+---------+-----------+----------------+-------------+ FV Mid   Full                                                             +---------+---------------+---------+-----------+----------------+-------------+ FV DistalFull                                                             +---------+---------------+---------+-----------+----------------+-------------+ PFV      Full                                                             +---------+---------------+---------+-----------+----------------+-------------+ POP      Partial        Yes      Yes        spongy          Chronic                                                   w/compression                 +---------+---------------+---------+-----------+----------------+-------------+ PTV      Full                                                             +---------+---------------+---------+-----------+----------------+-------------+ PERO     Full                                                             +---------+---------------+---------+-----------+----------------+-------------+  Soleal   Full                                                             +---------+---------------+---------+-----------+----------------+-------------+ Gastroc  Partial                            softly echogenicChronic       +---------+---------------+---------+-----------+----------------+-------------+     Summary: RIGHT: - No evidence of common femoral vein obstruction.   LEFT: - There is recanalized thrombus in the left popliteal vein(s). There is recanalized thrombus in the left popliteal vein(s). - No cystic structure found in the popliteal fossa.   *See table(s) above for measurements and observations. Electronically signed by Lemar Livings MD on 04/23/2023 at 6:04:00 PM.    Final    CT Angio Chest PE W and/or Wo Contrast  Result Date: 04/23/2023 CLINICAL DATA:  Shortness of breath EXAM: CT ANGIOGRAPHY CHEST WITH CONTRAST TECHNIQUE: Multidetector CT imaging of the chest was performed using the standard protocol during bolus administration of intravenous contrast. Multiplanar CT image reconstructions and MIPs were obtained to evaluate the vascular anatomy. RADIATION DOSE REDUCTION: This exam was performed according to the departmental dose-optimization program which includes automated exposure control, adjustment of the mA and/or kV according to patient size and/or use of iterative reconstruction technique. CONTRAST:  75mL OMNIPAQUE IOHEXOL 350 MG/ML SOLN COMPARISON:  CT angiogram chest 11/20/2022 FINDINGS: Cardiovascular: Heart is nonenlarged. No pericardial effusion. The thoracic aorta has a normal course and caliber. There is breathing motion throughout the examination significantly limited evaluation for pulmonary emboli, nondiagnostic for small and peripheral emboli. No large or central embolus. The previous central emboli are no longer identified. Mediastinum/Nodes: Triangular-shaped soft tissue in the anterior superior mediastinum consistent with some residual thymus tissue. No separate abnormal lymph node enlargement identified in the axillary region, hilum or mediastinum. Normal caliber thoracic esophagus. Preserved thyroid gland. Lungs/Pleura: Lungs are clear. No pleural effusion or pneumothorax. Upper Abdomen: No acute abnormality. Musculoskeletal: Curvature of the spine. Review of the MIP images confirms the above findings. IMPRESSION: Previous pulmonary emboli are no longer clearly identified. No new emboli identified although there is significant breathing motion limited evaluation. Nondiagnostic for small and peripheral emboli. Electronically  Signed   By: Karen Kays M.D.   On: 04/23/2023 11:55   DG Chest 1 View  Result Date: 04/23/2023 CLINICAL DATA:  Worsening shortness of breath EXAM: CHEST  1 VIEW COMPARISON:  03/07/2023 FINDINGS: The heart size and mediastinal contours are within normal limits. Both lungs are clear. The visualized skeletal structures are unremarkable except for similar slight dextroscoliosis of the spine. Lower chest nipple shadows noted. IMPRESSION: No active disease. Electronically Signed   By: Judie Petit.  Shick M.D.   On: 04/23/2023 09:10    Labs:  CBC: Recent Labs    04/23/23 1110 04/24/23 0017 04/24/23 0434 04/24/23 1343 04/25/23 0645 04/26/23 0436  WBC 8.5  --  6.6  --  10.5 15.5*  HGB 3.9*   < > 7.2* 7.5* 7.3* 8.3*  HCT 12.8*   < > 21.6* 23.4* 22.9* 25.5*  PLT 251  --  212  --  237 194   < > = values in this interval not displayed.    COAGS: Recent Labs  04/25/23 1501  INR 1.1    BMP: Recent Labs    03/07/23 1244 04/23/23 0829 04/24/23 0434 04/26/23 0436  NA 138 136 137 139  K 3.6 3.6 3.7 3.6  CL 106 104 107 109  CO2 24 19* 21* 22  GLUCOSE 99 118* 101* 104*  BUN 10 8 10 8   CALCIUM 9.0 8.4* 8.3* 8.6*  CREATININE 0.87 0.80 0.67 0.79  GFRNONAA >60 >60 >60 >60    LIVER FUNCTION TESTS: Recent Labs    11/20/22 1045 04/23/23 0829  BILITOT 0.4 0.7  AST 20 29  ALT 25 25  ALKPHOS 41 32*  PROT 6.5 6.1*  ALBUMIN 3.3* 3.3*    Assessment and Plan:  42 y.o. female inpatient. History of CKD, Uterine fibroids (requiring hospitalization, PE (on eliquis). Presented to the ED at [facility] on 10.8.24 with heavy menstrual bleeding since 9.15.24  SHOB, dizziness and headache. Found to have endometrial lesion, intramural fibroids and  anemia with Hgb 3.9 s/[ bilateral uterine artery embolization with IR Attending Dr. Milford Cage.   Patient seen at bedside with IR Attending Dr. Milford Cage. Husband at bedside. Currently without any significant complaints. Patient alert and laying in bed,calm. Denies any  fevers, headache, chest pain, SOB, cough, abdominal pain, nausea, vomiting or bleeding. Left radial access site is soft with no active bleeding and no appreciable pseudoaneurysm. Dressing is C/D/I   All questions and concerns were answered.   Discharge planning: Patient to be seen in IR clinic in 4 weeks time. Appropriate follow up plans are in place.   IR to sign off at this time - please call with questions or concerns.  Electronically Signed: Alene Mires, NP 04/26/2023, 12:35 PM   I spent a total of 15 Minutes at the patient's bedside AND on the patient's hospital floor or unit, greater than 50% of which was counseling/coordinating care for Sonya Garner

## 2023-04-26 NOTE — Progress Notes (Addendum)
   Subjective:  Patient was seen and examined at bedside. She states she is feeling better this morning. She notes some pain yesterday after the procedure, that was well controlled with her available as needed medications. She also mentions nausea and vomiting yesterday which prevented her from having dinner, but today she was able to eat breakfast and denies nausea or vomiting. She has not had a BM, but feels the urge and is passing gas. Her vaginal bleeding has continued to lighten, she only changed her pad once overnight.   Objective:  Vital signs in last 24 hours: Vitals:   04/25/23 2037 04/26/23 0034 04/26/23 0521 04/26/23 0825  BP: (!) 113/58 113/67 115/64 114/71  Pulse: 78 69 65 67  Resp: 16 16 16 20   Temp: 99.7 F (37.6 C) 98.9 F (37.2 C) 98.1 F (36.7 C) 98.7 F (37.1 C)  TempSrc: Oral Oral Oral Oral  SpO2: 100% 100% 99% 100%  Weight:   71.2 kg   Height:       Weight change:   Intake/Output Summary (Last 24 hours) at 04/26/2023 1055 Last data filed at 04/26/2023 0809 Gross per 24 hour  Intake 948 ml  Output 100 ml  Net 848 ml   Physical Examination:  General appearance - alert, well appearing, and in no distress and sitting up in bed, pleasant and conversational, well groomed  Mental status - alert, oriented to person, place, and time Mouth - mucous membranes moist, pharynx normal without lesions Heart - normal rate, regular rhythm, normal S1, S2, no murmurs, rubs, clicks or gallops Chest - lungs clear to auscultation bilaterally Abdomen - soft, nontender, nondistended, no masses or organomegaly Extremities - peripheral pulses normal, no pedal edema, no clubbing or cyanosis  Assessment/Plan:  Principal Problem:   Symptomatic anemia   Acute Blood Loss Anemia 2/2 uterine fibroids S/p Bilateral Uterine Artery Embolization Fe Deficiency Anemia Patient underwent IR directed uterine artery ablation yesterday. This morning she notes improvement in her symptoms,  particularly her vaginal bleeding. Her pain has been well controlled with available medications. She did have nausea and vomiting yesterday, but is tolerating PO this morning without nv. Hgb 8.3 this morning. Anticipating increased vaginal bleeding 2/2 [place]. Discussed precautions, requested patient let staff know if she has any increased bleeding, pain, or new symptoms so we can evaluate and check hgb. If patient does well today with continued improvement in symptoms, pain control, and hgb stability, anticipate discharge home tomorrow. PLAN: - Pain management: - Tylenol 650mg  as needed Q6 - Oxycodone 10mg  as needed Q4  - Morphine 2mg  as needed Q2 for the first 24hrs post-op, will end this afternoon - Scheduled zofran 4mg  every 6 hrs for 24hrs post-op - Continue megace 40mg  BID - Continue to monitor blood loss - If heavy bleeding, will check H/H - Transfuse for hgb <7  Hx of Unprovoked PE - holding eliquis. Completed 5 months of anticoagulation. May consider resuming prophylactic dose following source control.    Diet: Normal VTE: SCDs Code: Full   LOS: 1 day   Dorita Sciara, Medical Student 04/26/2023, 10:55 AM

## 2023-04-27 ENCOUNTER — Other Ambulatory Visit (HOSPITAL_COMMUNITY): Payer: Self-pay

## 2023-04-27 LAB — CBC
HCT: 27.4 % — ABNORMAL LOW (ref 36.0–46.0)
Hemoglobin: 8.7 g/dL — ABNORMAL LOW (ref 12.0–15.0)
MCH: 27 pg (ref 26.0–34.0)
MCHC: 31.8 g/dL (ref 30.0–36.0)
MCV: 85.1 fL (ref 80.0–100.0)
Platelets: 191 10*3/uL (ref 150–400)
RBC: 3.22 MIL/uL — ABNORMAL LOW (ref 3.87–5.11)
RDW: 21.2 % — ABNORMAL HIGH (ref 11.5–15.5)
WBC: 10.1 10*3/uL (ref 4.0–10.5)
nRBC: 1.2 % — ABNORMAL HIGH (ref 0.0–0.2)

## 2023-04-27 MED ORDER — FERROUS SULFATE 325 (65 FE) MG PO TABS
325.0000 mg | ORAL_TABLET | Freq: Every day | ORAL | 0 refills | Status: DC
  Filled 2023-04-27: qty 100, 100d supply, fill #0

## 2023-04-27 MED ORDER — SENNOSIDES-DOCUSATE SODIUM 8.6-50 MG PO TABS
1.0000 | ORAL_TABLET | Freq: Every day | ORAL | 0 refills | Status: DC | PRN
  Filled 2023-04-27: qty 5, 5d supply, fill #0

## 2023-04-27 MED ORDER — OXYCODONE HCL 5 MG PO TABS
5.0000 mg | ORAL_TABLET | Freq: Four times a day (QID) | ORAL | 0 refills | Status: AC | PRN
  Filled 2023-04-27: qty 12, 3d supply, fill #0

## 2023-04-27 NOTE — Discharge Summary (Signed)
Name: Sonya Garner MRN: [id number] DOB: [birth year] 42 y.o. PCP: Rana Snare, DO  Date of Admission: 04/23/2023  7:37 AM Date of Discharge: 04/27/23 Attending Physician: Dr. Antony Contras  Discharge Diagnosis: Principal Problem:   Symptomatic anemia    Discharge Medications: Allergies as of 04/27/2023       Reactions   Feraheme [ferumoxytol] Shortness Of Breath        Medication List     STOP taking these medications    apixaban 5 MG Tabs tablet Commonly known as: ELIQUIS       TAKE these medications    acetaminophen 500 MG tablet Commonly known as: TYLENOL Take 500-1,000 mg by mouth as needed for moderate pain.   FeroSul 325 (65 Fe) MG tablet Generic drug: ferrous sulfate Take 1 tablet (325 mg total) by mouth daily. What changed: when to take this   oxyCODONE 5 MG immediate release tablet Commonly known as: Oxy IR/ROXICODONE Take 1 tablet (5 mg total) by mouth every 6 (six) hours as needed for up to 3 days for severe pain or breakthrough pain.   Senna-S 8.6-50 MG tablet Generic drug: senna-docusate Take 1 tablet by mouth daily as needed for mild constipation.        Disposition and follow-up:   Ms.Gissella M Krol was discharged from [facility] in Good condition.  At the hospital follow up visit please address:  1.  Follow-up: - Symptomatic anemia: assess for vaginal bleeding, repeat CBC  - Fe deficiency anemia: referral placed for outpatient iron infusions, need repeat CBC at hospital f/u and iron panel in 4-6 weeks. - Abnormal uterine bleeding s/p uterine artery embolization: OBGYN f/u on 10/14 - Hx PE/DVT: held Eliquis, will need to continue conversation on stopping or restarting anticoagulation at new recommended dose.   2.  Labs / imaging needed at time of follow-up: CBC at hospital f/u appt, recheck Iron Panel in 4-6 weeks  3.  Pending labs/ test needing follow-up: none  4.  Medication Changes  -STOP Eliquis    -Oxycodone 5 mg Q6H PRN for up to 3 days  -Continue iron supplement   Follow-up Appointments:  Follow-up Information     Warden Fillers, MD. Go on 04/29/2023.   Specialty: Obstetrics and Gynecology Why: Appointment scheduled on: 04/29/2023 1:35 PM Contact information: [address] [place] [address] [phone]         Philomena Doheny, MD. Go on 05/16/2023.   Specialty: Internal Medicine Why: Appointment scheduled on: 05/16/2023 1:45 PM. They will recheck your hemoglobin level at this visit. Contact information: [address] [place] [address] [phone]                 Hospital Course by problem list: 1. Symptomatic anemia 2. Fe deficiency anemia - Patient reported several days of exertional shortness of breath, dizziness, and headache in the setting of prolonged heavy menstrual bleeding.  - She was non toxic appearing, HDS. Conjunctival and palatal pallor noted on exam.  - Hgb 3.9 on arrival, MCV 78. Transfused 2 units of PRBC with improvement to 6.7 then 7.2. - Symptoms resolved with transfusion. Patient still having vaginal bleeding - Iron studies revealed Fe 18 with ferritin of 4, elevated TIBC - confirming Fe deficiency.  - With assistance from pharmacy, patient received IV Iron sucrose following premedication with famotidine, benadryl, and methylprednisolone to prevent possible reaction given patient's prior allergy. She tolerated well without reaction.  - Referral placed for outpatient Iron infusions. Recommend  Name: Sonya Garner MRN: [id number] DOB: [birth year] 42 y.o. PCP: Rana Snare, DO  Date of Admission: 04/23/2023  7:37 AM Date of Discharge: 04/27/23 Attending Physician: Dr. Antony Contras  Discharge Diagnosis: Principal Problem:   Symptomatic anemia    Discharge Medications: Allergies as of 04/27/2023       Reactions   Feraheme [ferumoxytol] Shortness Of Breath        Medication List     STOP taking these medications    apixaban 5 MG Tabs tablet Commonly known as: ELIQUIS       TAKE these medications    acetaminophen 500 MG tablet Commonly known as: TYLENOL Take 500-1,000 mg by mouth as needed for moderate pain.   FeroSul 325 (65 Fe) MG tablet Generic drug: ferrous sulfate Take 1 tablet (325 mg total) by mouth daily. What changed: when to take this   oxyCODONE 5 MG immediate release tablet Commonly known as: Oxy IR/ROXICODONE Take 1 tablet (5 mg total) by mouth every 6 (six) hours as needed for up to 3 days for severe pain or breakthrough pain.   Senna-S 8.6-50 MG tablet Generic drug: senna-docusate Take 1 tablet by mouth daily as needed for mild constipation.        Disposition and follow-up:   Ms.Gissella M Krol was discharged from [facility] in Good condition.  At the hospital follow up visit please address:  1.  Follow-up: - Symptomatic anemia: assess for vaginal bleeding, repeat CBC  - Fe deficiency anemia: referral placed for outpatient iron infusions, need repeat CBC at hospital f/u and iron panel in 4-6 weeks. - Abnormal uterine bleeding s/p uterine artery embolization: OBGYN f/u on 10/14 - Hx PE/DVT: held Eliquis, will need to continue conversation on stopping or restarting anticoagulation at new recommended dose.   2.  Labs / imaging needed at time of follow-up: CBC at hospital f/u appt, recheck Iron Panel in 4-6 weeks  3.  Pending labs/ test needing follow-up: none  4.  Medication Changes  -STOP Eliquis    -Oxycodone 5 mg Q6H PRN for up to 3 days  -Continue iron supplement   Follow-up Appointments:  Follow-up Information     Warden Fillers, MD. Go on 04/29/2023.   Specialty: Obstetrics and Gynecology Why: Appointment scheduled on: 04/29/2023 1:35 PM Contact information: [address] [place] [address] [phone]         Philomena Doheny, MD. Go on 05/16/2023.   Specialty: Internal Medicine Why: Appointment scheduled on: 05/16/2023 1:45 PM. They will recheck your hemoglobin level at this visit. Contact information: [address] [place] [address] [phone]                 Hospital Course by problem list: 1. Symptomatic anemia 2. Fe deficiency anemia - Patient reported several days of exertional shortness of breath, dizziness, and headache in the setting of prolonged heavy menstrual bleeding.  - She was non toxic appearing, HDS. Conjunctival and palatal pallor noted on exam.  - Hgb 3.9 on arrival, MCV 78. Transfused 2 units of PRBC with improvement to 6.7 then 7.2. - Symptoms resolved with transfusion. Patient still having vaginal bleeding - Iron studies revealed Fe 18 with ferritin of 4, elevated TIBC - confirming Fe deficiency.  - With assistance from pharmacy, patient received IV Iron sucrose following premedication with famotidine, benadryl, and methylprednisolone to prevent possible reaction given patient's prior allergy. She tolerated well without reaction.  - Referral placed for outpatient Iron infusions. Recommend  CTA Chest 10/9: Previous pulmonary emboli are no longer clearly identified. No new emboli identified although there is significant breathing motion limited evaluation. Nondiagnostic for small and peripheral emboli.  Vascular US L lower extremity 04/24/23: recanalized thrombus in L popliteal vein.  MRI Pelvis w wo contrast 04/25/23:  Enhancing endometrial lesion centrally most adherent to the anterior wall, measuring 2.8 by 2.3 by 3.3 cm, possibilities include polyp, endometrial carcinoma, or submucosal fibroid. Carcinoma less favored given the lack of substantial heterogeneity.  Multiple intramural fibroids are present. In general these are diffusely enhancing.  IR guided Bilateral uterine artery embolization on 04/25/23: performed by Dr. Roanna Banning     Latest Ref Rng & Units 04/27/2023    2:55 AM 04/26/2023    4:36 AM 04/25/2023    6:45 AM  CBC  WBC 4.0 - 10.5 K/uL 10.1  15.5  10.5   Hemoglobin 12.0 - 15.0 g/dL 8.7  8.3  7.3   Hematocrit 36.0 - 46.0 % 27.4  25.5  22.9   Platelets 150 - 400 K/uL 191  194  237        Latest Ref Rng & Units 04/26/2023    4:36 AM 04/24/2023    4:34 AM 04/23/2023    8:29 AM  CMP  Glucose 70 - 99 mg/dL 295  621  308   BUN 6 - 20 mg/dL 8  10  8    Creatinine 0.44 - 1.00 mg/dL 6.57  8.46  9.62   Sodium 135 - 145 mmol/L 139  137  136   Potassium 3.5 - 5.1 mmol/L 3.6  3.7  3.6   Chloride 98 - 111 mmol/L 109  107  104   CO2 22 - 32 mmol/L 22  21  19    Calcium 8.9 - 10.3 mg/dL 8.6  8.3  8.4   Total Protein 6.5 - 8.1 g/dL   6.1   Total Bilirubin 0.3 - 1.2 mg/dL   0.7   Alkaline Phos 38 - 126 U/L   32   AST 15 - 41 U/L   29   ALT 0 - 44 U/L   25     VAS [place] LOWER EXTREMITY VENOUS (DVT) (7a-7p)  Result Date: 04/23/2023  Lower Venous DVT Study Patient Name:  MAKEISHA JENTSCH  Date of Exam:   04/23/2023 Medical Rec #: [id number]         Accession #:    [id number] Date of Birth: July 19, 1980         Patient Gender: F Patient Age:   3 years Exam Location:  [facility] Procedure:      VAS [place] LOWER EXTREMITY VENOUS (DVT) Referring Phys: HAYLEY NAASZ --------------------------------------------------------------------------------  Indications: Edema, and Swelling.  Risk Factors: DVT Left leg 5/24. Anticoagulation: Eliquis. Comparison Study: Improved findings since previous exam 11/20/22 Performing Technologist: Shona Simpson  Examination Guidelines: A complete evaluation includes B-mode imaging, spectral Doppler, color Doppler, and power Doppler as needed of all accessible portions of each vessel. Bilateral testing is considered an integral part of a complete examination. Limited examinations for reoccurring indications may be performed as noted. The reflux portion of the exam is performed with the patient  in reverse Trendelenburg.  +-----+---------------+---------+-----------+----------+--------------+ RIGHTCompressibilityPhasicitySpontaneityPropertiesThrombus Aging +-----+---------------+---------+-----------+----------+--------------+ CFV  Full           Yes      Yes                                 +-----+---------------+---------+-----------+----------+--------------+   +---------+---------------+---------+-----------+----------------+-------------+  CTA Chest 10/9: Previous pulmonary emboli are no longer clearly identified. No new emboli identified although there is significant breathing motion limited evaluation. Nondiagnostic for small and peripheral emboli.  Vascular US L lower extremity 04/24/23: recanalized thrombus in L popliteal vein.  MRI Pelvis w wo contrast 04/25/23:  Enhancing endometrial lesion centrally most adherent to the anterior wall, measuring 2.8 by 2.3 by 3.3 cm, possibilities include polyp, endometrial carcinoma, or submucosal fibroid. Carcinoma less favored given the lack of substantial heterogeneity.  Multiple intramural fibroids are present. In general these are diffusely enhancing.  IR guided Bilateral uterine artery embolization on 04/25/23: performed by Dr. Roanna Banning     Latest Ref Rng & Units 04/27/2023    2:55 AM 04/26/2023    4:36 AM 04/25/2023    6:45 AM  CBC  WBC 4.0 - 10.5 K/uL 10.1  15.5  10.5   Hemoglobin 12.0 - 15.0 g/dL 8.7  8.3  7.3   Hematocrit 36.0 - 46.0 % 27.4  25.5  22.9   Platelets 150 - 400 K/uL 191  194  237        Latest Ref Rng & Units 04/26/2023    4:36 AM 04/24/2023    4:34 AM 04/23/2023    8:29 AM  CMP  Glucose 70 - 99 mg/dL 295  621  308   BUN 6 - 20 mg/dL 8  10  8    Creatinine 0.44 - 1.00 mg/dL 6.57  8.46  9.62   Sodium 135 - 145 mmol/L 139  137  136   Potassium 3.5 - 5.1 mmol/L 3.6  3.7  3.6   Chloride 98 - 111 mmol/L 109  107  104   CO2 22 - 32 mmol/L 22  21  19    Calcium 8.9 - 10.3 mg/dL 8.6  8.3  8.4   Total Protein 6.5 - 8.1 g/dL   6.1   Total Bilirubin 0.3 - 1.2 mg/dL   0.7   Alkaline Phos 38 - 126 U/L   32   AST 15 - 41 U/L   29   ALT 0 - 44 U/L   25     VAS [place] LOWER EXTREMITY VENOUS (DVT) (7a-7p)  Result Date: 04/23/2023  Lower Venous DVT Study Patient Name:  MAKEISHA JENTSCH  Date of Exam:   04/23/2023 Medical Rec #: [id number]         Accession #:    [id number] Date of Birth: July 19, 1980         Patient Gender: F Patient Age:   3 years Exam Location:  [facility] Procedure:      VAS [place] LOWER EXTREMITY VENOUS (DVT) Referring Phys: HAYLEY NAASZ --------------------------------------------------------------------------------  Indications: Edema, and Swelling.  Risk Factors: DVT Left leg 5/24. Anticoagulation: Eliquis. Comparison Study: Improved findings since previous exam 11/20/22 Performing Technologist: Shona Simpson  Examination Guidelines: A complete evaluation includes B-mode imaging, spectral Doppler, color Doppler, and power Doppler as needed of all accessible portions of each vessel. Bilateral testing is considered an integral part of a complete examination. Limited examinations for reoccurring indications may be performed as noted. The reflux portion of the exam is performed with the patient  in reverse Trendelenburg.  +-----+---------------+---------+-----------+----------+--------------+ RIGHTCompressibilityPhasicitySpontaneityPropertiesThrombus Aging +-----+---------------+---------+-----------+----------+--------------+ CFV  Full           Yes      Yes                                 +-----+---------------+---------+-----------+----------+--------------+   +---------+---------------+---------+-----------+----------------+-------------+  CTA Chest 10/9: Previous pulmonary emboli are no longer clearly identified. No new emboli identified although there is significant breathing motion limited evaluation. Nondiagnostic for small and peripheral emboli.  Vascular US L lower extremity 04/24/23: recanalized thrombus in L popliteal vein.  MRI Pelvis w wo contrast 04/25/23:  Enhancing endometrial lesion centrally most adherent to the anterior wall, measuring 2.8 by 2.3 by 3.3 cm, possibilities include polyp, endometrial carcinoma, or submucosal fibroid. Carcinoma less favored given the lack of substantial heterogeneity.  Multiple intramural fibroids are present. In general these are diffusely enhancing.  IR guided Bilateral uterine artery embolization on 04/25/23: performed by Dr. Roanna Banning     Latest Ref Rng & Units 04/27/2023    2:55 AM 04/26/2023    4:36 AM 04/25/2023    6:45 AM  CBC  WBC 4.0 - 10.5 K/uL 10.1  15.5  10.5   Hemoglobin 12.0 - 15.0 g/dL 8.7  8.3  7.3   Hematocrit 36.0 - 46.0 % 27.4  25.5  22.9   Platelets 150 - 400 K/uL 191  194  237        Latest Ref Rng & Units 04/26/2023    4:36 AM 04/24/2023    4:34 AM 04/23/2023    8:29 AM  CMP  Glucose 70 - 99 mg/dL 295  621  308   BUN 6 - 20 mg/dL 8  10  8    Creatinine 0.44 - 1.00 mg/dL 6.57  8.46  9.62   Sodium 135 - 145 mmol/L 139  137  136   Potassium 3.5 - 5.1 mmol/L 3.6  3.7  3.6   Chloride 98 - 111 mmol/L 109  107  104   CO2 22 - 32 mmol/L 22  21  19    Calcium 8.9 - 10.3 mg/dL 8.6  8.3  8.4   Total Protein 6.5 - 8.1 g/dL   6.1   Total Bilirubin 0.3 - 1.2 mg/dL   0.7   Alkaline Phos 38 - 126 U/L   32   AST 15 - 41 U/L   29   ALT 0 - 44 U/L   25     VAS [place] LOWER EXTREMITY VENOUS (DVT) (7a-7p)  Result Date: 04/23/2023  Lower Venous DVT Study Patient Name:  MAKEISHA JENTSCH  Date of Exam:   04/23/2023 Medical Rec #: [id number]         Accession #:    [id number] Date of Birth: July 19, 1980         Patient Gender: F Patient Age:   3 years Exam Location:  [facility] Procedure:      VAS [place] LOWER EXTREMITY VENOUS (DVT) Referring Phys: HAYLEY NAASZ --------------------------------------------------------------------------------  Indications: Edema, and Swelling.  Risk Factors: DVT Left leg 5/24. Anticoagulation: Eliquis. Comparison Study: Improved findings since previous exam 11/20/22 Performing Technologist: Shona Simpson  Examination Guidelines: A complete evaluation includes B-mode imaging, spectral Doppler, color Doppler, and power Doppler as needed of all accessible portions of each vessel. Bilateral testing is considered an integral part of a complete examination. Limited examinations for reoccurring indications may be performed as noted. The reflux portion of the exam is performed with the patient  in reverse Trendelenburg.  +-----+---------------+---------+-----------+----------+--------------+ RIGHTCompressibilityPhasicitySpontaneityPropertiesThrombus Aging +-----+---------------+---------+-----------+----------+--------------+ CFV  Full           Yes      Yes                                 +-----+---------------+---------+-----------+----------+--------------+   +---------+---------------+---------+-----------+----------------+-------------+  Name: Sonya Garner MRN: [id number] DOB: [birth year] 42 y.o. PCP: Rana Snare, DO  Date of Admission: 04/23/2023  7:37 AM Date of Discharge: 04/27/23 Attending Physician: Dr. Antony Contras  Discharge Diagnosis: Principal Problem:   Symptomatic anemia    Discharge Medications: Allergies as of 04/27/2023       Reactions   Feraheme [ferumoxytol] Shortness Of Breath        Medication List     STOP taking these medications    apixaban 5 MG Tabs tablet Commonly known as: ELIQUIS       TAKE these medications    acetaminophen 500 MG tablet Commonly known as: TYLENOL Take 500-1,000 mg by mouth as needed for moderate pain.   FeroSul 325 (65 Fe) MG tablet Generic drug: ferrous sulfate Take 1 tablet (325 mg total) by mouth daily. What changed: when to take this   oxyCODONE 5 MG immediate release tablet Commonly known as: Oxy IR/ROXICODONE Take 1 tablet (5 mg total) by mouth every 6 (six) hours as needed for up to 3 days for severe pain or breakthrough pain.   Senna-S 8.6-50 MG tablet Generic drug: senna-docusate Take 1 tablet by mouth daily as needed for mild constipation.        Disposition and follow-up:   Ms.Gissella M Krol was discharged from [facility] in Good condition.  At the hospital follow up visit please address:  1.  Follow-up: - Symptomatic anemia: assess for vaginal bleeding, repeat CBC  - Fe deficiency anemia: referral placed for outpatient iron infusions, need repeat CBC at hospital f/u and iron panel in 4-6 weeks. - Abnormal uterine bleeding s/p uterine artery embolization: OBGYN f/u on 10/14 - Hx PE/DVT: held Eliquis, will need to continue conversation on stopping or restarting anticoagulation at new recommended dose.   2.  Labs / imaging needed at time of follow-up: CBC at hospital f/u appt, recheck Iron Panel in 4-6 weeks  3.  Pending labs/ test needing follow-up: none  4.  Medication Changes  -STOP Eliquis    -Oxycodone 5 mg Q6H PRN for up to 3 days  -Continue iron supplement   Follow-up Appointments:  Follow-up Information     Warden Fillers, MD. Go on 04/29/2023.   Specialty: Obstetrics and Gynecology Why: Appointment scheduled on: 04/29/2023 1:35 PM Contact information: [address] [place] [address] [phone]         Philomena Doheny, MD. Go on 05/16/2023.   Specialty: Internal Medicine Why: Appointment scheduled on: 05/16/2023 1:45 PM. They will recheck your hemoglobin level at this visit. Contact information: [address] [place] [address] [phone]                 Hospital Course by problem list: 1. Symptomatic anemia 2. Fe deficiency anemia - Patient reported several days of exertional shortness of breath, dizziness, and headache in the setting of prolonged heavy menstrual bleeding.  - She was non toxic appearing, HDS. Conjunctival and palatal pallor noted on exam.  - Hgb 3.9 on arrival, MCV 78. Transfused 2 units of PRBC with improvement to 6.7 then 7.2. - Symptoms resolved with transfusion. Patient still having vaginal bleeding - Iron studies revealed Fe 18 with ferritin of 4, elevated TIBC - confirming Fe deficiency.  - With assistance from pharmacy, patient received IV Iron sucrose following premedication with famotidine, benadryl, and methylprednisolone to prevent possible reaction given patient's prior allergy. She tolerated well without reaction.  - Referral placed for outpatient Iron infusions. Recommend

## 2023-04-27 NOTE — Discharge Instructions (Addendum)
You were admitted to the hospital due to severe blood loss anemia from your vaginal bleeding. You required 3 units of blood to help increase and maintain your blood counts. To obtain true control of your bleeding, we consulted our interventional radiologist team to evaluate you for a Uterine Artery Embolization, a procedure that cuts off the blood flow to the fibroids in your uterus that are contributing to your bleeding. They performed that procedure on 04/25/23. Your hemoglobin (blood count) has been stable since the procedure. I am also glad to hear the vaginal bleeding is stable and resolving.    It is important you continue to follow up with your Ob-Gyn team at your scheduled appointment on Monday Oct 14th, 2024.   We discontinued your Eliquis until you follow up with [place] at the Internal Medicine Clinic in 1-2 weeks. If you notice worsening vaginal bleeding or return of symptoms that brought you into the hospital, please seek emergent medical evaluation.    Medication Changes: *START Medications: Oxycodone 5 mg every 6 hours as needed for severe pain for up to 3 days. You can continue taking Tylenol 1000 mg every 8 hours as needed for pain.  *STOP Medications: Eliquis, Megace  *Continue Medications: iron supplement    Please call our clinic if you have any questions or concerns, we may be able to help and keep you from a long and expensive emergency room wait. Our clinic and after hours phone number is [phone], the best time to call is Monday through Friday 9 am to 4 pm but there is always someone available 24/7 if you have an emergency. If you need medication refills please notify your pharmacy one week in advance and they will send [place] a request.

## 2023-04-29 ENCOUNTER — Encounter: Payer: Self-pay | Admitting: Obstetrics and Gynecology

## 2023-04-29 ENCOUNTER — Telehealth: Payer: Self-pay

## 2023-04-29 ENCOUNTER — Ambulatory Visit (INDEPENDENT_AMBULATORY_CARE_PROVIDER_SITE_OTHER): Payer: Managed Care, Other (non HMO) | Admitting: Obstetrics and Gynecology

## 2023-04-29 ENCOUNTER — Other Ambulatory Visit: Payer: Self-pay

## 2023-04-29 VITALS — BP 112/67 | HR 94 | Ht 64.0 in | Wt 151.8 lb

## 2023-04-29 DIAGNOSIS — N939 Abnormal uterine and vaginal bleeding, unspecified: Secondary | ICD-10-CM | POA: Diagnosis not present

## 2023-04-29 DIAGNOSIS — D259 Leiomyoma of uterus, unspecified: Secondary | ICD-10-CM

## 2023-04-29 NOTE — Progress Notes (Signed)
  Pt states is bleeding lightly

## 2023-04-29 NOTE — Progress Notes (Signed)
CC; hospital follow up, anemia Subjective:    Patient ID: Sonya Garner, female    DOB: [birth year], 42 y.o.   MRN: [id number]  HPI 42 yo G4P4 ,svd x 4, seen after recent hospital admission for severe anemia secondary to fibroids and  anticoagulation.  Pt had recent DVT/PE  5/24.  Pt initially was started on megace and then underwent UFE.  Bleeding is now spotting.  Eliquis and megace were both discontinued.  Pt has follow up with IR in 4 weeks. Review of chart showed 3 small to medium signed fibroids.  On is possibly a pedunculated submucosal fibroid.    Review of Systems     Objective:   Physical Exam Vitals:   04/29/23 1347  BP: 112/67  Pulse: 94  SSE: very light bleeding,small amount of old blood in the vault, no active bleeding  SVE: slightly enlarged uterus with severe retroflexion Cervix is just behind pubic symphysis.       Assessment & Plan:   1. Abnormal uterine bleeding Pt was just treated 3-4 days ago by UFE with improvement already noted.  Reassess after IR follow up in 6 weeks.  If bleeding returns or increases would be hysteroscopic resection of submucosal mass followed by possible hysterectomy at some point  2. Uterine leiomyoma, unspecified location See above.  I spent 30 minutes dedicated to the care of this patient including previsit review of records, face to face time with the patient discussing treatment option and post visit testing.     Warden Fillers, MD Faculty Attending, [facility]

## 2023-04-29 NOTE — Transitions of Care (Post Inpatient/ED Visit) (Signed)
04/29/2023  Name: Sonya Garner MRN: [id number] DOB: Aug 27, 1980  Today's TOC FU Call Status: Today's TOC FU Call Status:: Successful TOC FU Call Completed TOC FU Call Complete Date: 04/29/23 Patient's Name and Date of Birth confirmed.  Transition Care Management Follow-up Telephone Call Date of Discharge: 04/27/23 Discharge Facility: [facility] Type of Discharge: Inpatient Admission Primary Inpatient Discharge Diagnosis:: anemia How have you been since you were released from the hospital?: Better Any questions or concerns?: No  Items Reviewed: Did you receive and understand the discharge instructions provided?: Yes Medications obtained,verified, and reconciled?: Yes (Medications Reviewed) Any new allergies since your discharge?: No Dietary orders reviewed?: Yes Do you have support at home?: Yes People in Home: child(ren), adult, spouse  Medications Reviewed Today: Medications Reviewed Today     Reviewed by Karena Addison, LPN (Licensed Practical Nurse) on 04/29/23 at 1120  Med List Status: <None>   Medication Order Taking? Sig Documenting Provider Last Dose Status Informant  acetaminophen (TYLENOL) 500 MG tablet [id number] No Take 500-1,000 mg by mouth as needed for moderate pain. [provider] 04/22/2023 Active Self, Pharmacy Records  ferrous sulfate (FEROSUL) 325 (65 FE) MG tablet [id number]  Take 1 tablet (325 mg total) by mouth daily. Rana Snare, DO  Active   oxyCODONE (OXY IR/ROXICODONE) 5 MG immediate release tablet [id number]  Take 1 tablet (5 mg total) by mouth every 6 (six) hours as needed for up to 3 days for severe pain or breakthrough pain. Rana Snare, DO  Active   senna-docusate (SENOKOT-S) 8.6-50 MG tablet [id number]  Take 1 tablet by mouth daily as needed for mild constipation. Rana Snare, DO  Active             Home Care and Equipment/Supplies: Were Home Health Services Ordered?: NA Any new equipment or medical supplies  ordered?: NA  Functional Questionnaire: Do you need assistance with bathing/showering or dressing?: No Do you need assistance with eating?: No Do you have difficulty maintaining continence: No Do you have difficulty managing or taking your medications?: No  Follow up appointments reviewed: PCP Follow-up appointment confirmed?: Yes Date of PCP follow-up appointment?: 05/16/23 Follow-up Provider: Justin Mend [facility] Follow-up appointment confirmed?: Yes Date of Specialist follow-up appointment?: 04/29/23 Follow-Up Specialty Provider:: OB Do you need transportation to your follow-up appointment?: No Do you understand care options if your condition(s) worsen?: Yes-patient verbalized understanding    SIGNATURE Karena Addison, LPN [facility] Nurse Health Advisor Direct Dial [phone]

## 2023-04-30 ENCOUNTER — Encounter: Payer: Self-pay | Admitting: Pulmonary Disease

## 2023-04-30 ENCOUNTER — Telehealth: Payer: Self-pay

## 2023-04-30 NOTE — Telephone Encounter (Signed)
Auth Submission: NO AUTH NEEDED Site of care: Site of care: [facility] Payer: [employer] commercial Medication & CPT/J Code(s) submitted: Venofer (Iron Sucrose) J1756 Route of submission (phone, fax, portal):  Phone # Fax # Auth type: Buy/Bill PB Units/visits requested: 500mg  x 1 dose Reference number:  Approval from: 04/30/23 to 07/16/23

## 2023-04-30 NOTE — Progress Notes (Signed)
Encounter opened in error.  Maureen Ralphs RN on 04/30/23 at [phone]

## 2023-05-02 ENCOUNTER — Other Ambulatory Visit (HOSPITAL_COMMUNITY): Payer: Self-pay

## 2023-05-03 ENCOUNTER — Other Ambulatory Visit: Payer: Self-pay

## 2023-05-03 NOTE — Telephone Encounter (Signed)
Encounter opened in error.   Maureen Ralphs RN on 05/03/23 at [phone]

## 2023-05-06 ENCOUNTER — Ambulatory Visit: Payer: Managed Care, Other (non HMO)

## 2023-05-06 VITALS — BP 107/68 | HR 77 | Temp 98.9°F | Resp 16 | Ht 64.0 in | Wt 155.2 lb

## 2023-05-06 DIAGNOSIS — D500 Iron deficiency anemia secondary to blood loss (chronic): Secondary | ICD-10-CM

## 2023-05-06 DIAGNOSIS — N939 Abnormal uterine and vaginal bleeding, unspecified: Secondary | ICD-10-CM | POA: Diagnosis not present

## 2023-05-06 DIAGNOSIS — D509 Iron deficiency anemia, unspecified: Secondary | ICD-10-CM

## 2023-05-06 MED ORDER — IRON SUCROSE 500 MG IVPB - SIMPLE MED
500.0000 mg | Freq: Once | INTRAVENOUS | Status: AC
  Administered 2023-05-06: 500 mg via INTRAVENOUS
  Filled 2023-05-06: qty 500

## 2023-05-06 MED ORDER — ACETAMINOPHEN 325 MG PO TABS
650.0000 mg | ORAL_TABLET | Freq: Once | ORAL | Status: AC
  Administered 2023-05-06: 650 mg via ORAL
  Filled 2023-05-06: qty 2

## 2023-05-06 MED ORDER — DIPHENHYDRAMINE HCL 25 MG PO CAPS
25.0000 mg | ORAL_CAPSULE | Freq: Once | ORAL | Status: AC
  Administered 2023-05-06: 25 mg via ORAL
  Filled 2023-05-06: qty 1

## 2023-05-06 NOTE — Progress Notes (Signed)
Diagnosis: Iron Deficiency Anemia  Provider:  Chilton Greathouse MD  Procedure: IV Infusion  IV Type: Peripheral, IV Location: L Forearm  Venofer (Iron Sucrose), Dose: 500 mg  Infusion Start Time: 1055  Infusion Stop Time: 1439  Post Infusion IV Care: Observation period completed and Peripheral IV Discontinued  Discharge: Condition: Stable, Destination: Home . AVS Provided  Performed by:  Wyvonne Lenz, RN

## 2023-05-09 ENCOUNTER — Telehealth: Payer: Self-pay | Admitting: Lactation Services

## 2023-05-09 NOTE — Telephone Encounter (Signed)
Received fax in office for Clarification of Lupron order. Filled out information, signed by Dr. Shawnie Pons and faxed back to [facility]. Fax confirmation received.

## 2023-05-16 ENCOUNTER — Encounter: Payer: Self-pay | Admitting: Student

## 2023-05-16 ENCOUNTER — Ambulatory Visit: Payer: Managed Care, Other (non HMO) | Admitting: Student

## 2023-05-16 VITALS — BP 110/68 | HR 98 | Temp 98.2°F | Ht 64.0 in | Wt 153.6 lb

## 2023-05-16 DIAGNOSIS — D509 Iron deficiency anemia, unspecified: Secondary | ICD-10-CM

## 2023-05-16 DIAGNOSIS — N939 Abnormal uterine and vaginal bleeding, unspecified: Secondary | ICD-10-CM | POA: Diagnosis not present

## 2023-05-16 DIAGNOSIS — I824Z2 Acute embolism and thrombosis of unspecified deep veins of left distal lower extremity: Secondary | ICD-10-CM | POA: Diagnosis not present

## 2023-05-16 NOTE — Patient Instructions (Signed)
Thank you, Ms.Reeve Wickenhauser Gurr for allowing [place] to provide your care today. Today we discussed your hospitalization follow up for vaginal bleeding following your uterine artery embolization.     I have ordered the following labs for you:   Lab Orders         CBC no Diff      Tests ordered today:  - None  Referrals ordered today:   Referral Orders  No referral(s) requested today     I have ordered the following medication/changed the following medications:   Stop the following medications: There are no discontinued medications.   Start the following medications: No orders of the defined types were placed in this encounter.    Follow up: 2-4 weeks for lab visit (iron panel)    Remember:   - I will call you with the results of your complete blood count today to help determine if you need any blood transfusions  - Please continue to take Tylenol for cramping pains and headache, consider using hot compresses if that helps.  - Please continue to take your oral iron. You will come back in 2-4 weeks for a lab visit to check your iron levels.  - Please continue to follow with [facility] and Dr. Donavan Foil.  - If your bleeding worsens or your symptoms (including pain) worsen please call our clinic to make a follow up appointment.  - Please continue to HOLD your Eliquis. We will continue to wait for bleeding to improve and have a conversation in the future about it.   Should you have any questions or concerns please call the internal medicine clinic at [phone].     Boysie Bonebrake Colbert Coyer, MD PGY-1 Internal Medicine Teaching Progam [facility]

## 2023-05-17 ENCOUNTER — Other Ambulatory Visit: Payer: Self-pay | Admitting: Student

## 2023-05-17 DIAGNOSIS — D75839 Thrombocytosis, unspecified: Secondary | ICD-10-CM

## 2023-05-17 LAB — CBC
Hematocrit: 35.5 % (ref 34.0–46.6)
Hemoglobin: 11.4 g/dL (ref 11.1–15.9)
MCH: 28.1 pg (ref 26.6–33.0)
MCHC: 32.1 g/dL (ref 31.5–35.7)
MCV: 88 fL (ref 79–97)
Platelets: 817 10*3/uL (ref 150–450)
RBC: 4.05 x10E6/uL (ref 3.77–5.28)
RDW: 19.5 % — ABNORMAL HIGH (ref 11.7–15.4)
WBC: 5.1 10*3/uL (ref 3.4–10.8)

## 2023-05-17 NOTE — Progress Notes (Signed)
ss Established Patient Office Visit  Subjective   Patient ID: Sonya Garner, female    DOB: 04/06/1981  Age: 42 y.o. MRN: [id number]  Chief Complaint  Patient presents with   Hospitalization Follow-up    Patient is a 42 yo with a past medical history stated below who presents today for hospitalization follow up. She was discharged on 10/12 for vaginal bleeding due to fibroids s/p uterine artery embolization. Please see problem based assessment and plan for additional details.      Past Medical History:  Diagnosis Date   Chronic kidney disease    kidney stone   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   Qualifier: Diagnosis of   By: Levada Schilling       Mild concussion 12/05/2016      Review of Systems  Constitutional:  Positive for malaise/fatigue. Negative for chills and fever.  Genitourinary:        Menstrual-like cramping.  Neurological:  Positive for headaches. Negative for dizziness and weakness.     Objective:     BP 110/68 (BP Location: Right Arm, Patient Position: Sitting, Cuff Size: Normal)   Pulse 98   Temp 98.2 F (36.8 C) (Oral)   Ht 5\' 4"  (1.626 m)   Wt 153 lb 9.6 oz (69.7 kg)   LMP 03/31/2023 (Exact Date) Comment: has been bleeding continuous  SpO2 100%   BMI 26.37 kg/m  BP Readings from Last 3 Encounters:  05/16/23 110/68  05/06/23 107/68  04/29/23 112/67   Wt Readings from Last 3 Encounters:  05/16/23 153 lb 9.6 oz (69.7 kg)  05/06/23 155 lb 3.2 oz (70.4 kg)  04/29/23 151 lb 12.8 oz (68.9 kg)      Physical Exam Constitutional:      Appearance: Normal appearance.  HENT:     Head: Normocephalic and atraumatic.     Nose: Nose normal.     Mouth/Throat:     Mouth: Mucous membranes are moist.  Eyes:     Extraocular Movements: Extraocular movements intact.     Pupils: Pupils are equal, round, and reactive to light.  Cardiovascular:     Rate and Rhythm: Normal rate and regular rhythm.     Pulses: Normal pulses.     Heart sounds: Normal heart  sounds.  Pulmonary:     Effort: Pulmonary effort is normal.     Breath sounds: Normal breath sounds.  Abdominal:     General: Abdomen is flat. Bowel sounds are normal.     Palpations: Abdomen is soft.  Skin:    General: Skin is warm and dry.  Neurological:     General: No focal deficit present.     Mental Status: She is alert.  Psychiatric:        Mood and Affect: Mood normal.        Behavior: Behavior normal.     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 Eliquis 5 mg BID was held during past hospitalization due to vaginal bleeding and iron deficiency anemia. At the time of the hospitalization, patient had completed 5 months of anticoagulation. Still holding at this time. Will need to discuss ongoing need/risk vs benefit once patient is able to discuss other options for ongoing vaginal bleed (like hysterectomy). No lower extremity swelling/tenderness, SOB, or CP today. Headaches described seem occasional and stress related, improve with Tylenol, will monitor.  - Continue holding off on anticoagulation at this time        Genitourinary   Abnormal uterine bleeding - Primary    Patient discharged with Hgb 8.7. She is taking daily iron supplementation, senokot as needed for constipation, and Tylenol for cramp-like pains (takes as needed, sometimes 2, 500 mg  a day). Denies ibuprofen/NSAID use. She endorses fatigue, occasional tension headaches (for which she also takes Tylenol), and vaginal spotting. Per patient, vaginal bleeding has greatly improved since uterine artery embolization. She is still having to wear protective pads but does not have to change them out as often. She was recently seen by Dr. Donavan Foil at [facility] on 10/14 where they discussed how she's doing. She will have close follow up in December and can discuss next steps, including possibility of hysterectomy. Repeat Hgb today 11.4, platelets 817. Platelets have not been this elevated in the past, likely a reactive process and/or due to ongoing iron deficiency. Too soon to recheck iron panels, will have patient return for lab visit to check iron/TIBC/ferritin and CBC. Will also follow up on outpatient referral for iron infusions.  - Continue ferrous sulfate 325 mg daily  - Return around 11/14 for iron panel and CBC labs - Follow up on outpatient iron infusion referral - Continue to follow up with OBGYN       Relevant Orders   CBC no Diff (Completed)   Iron, TIBC and Ferritin Panel     Other   Iron deficiency anemia (Chronic)   Relevant Orders   Iron, TIBC and Ferritin Panel    Return 2-4 weeks, for Lab visit for iron panel .  Patient seen with Dr. Oswaldo Done.   Mozel Burdett Colbert Coyer, MD

## 2023-05-18 NOTE — Assessment & Plan Note (Addendum)
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 Eliquis 5 mg BID was held during past hospitalization due to vaginal bleeding and iron deficiency anemia. At the time of the hospitalization, patient had completed 5 months of anticoagulation. Still holding at this time. Will need to discuss ongoing need/risk vs benefit once patient is able to discuss other options for ongoing vaginal bleed (like hysterectomy). No lower extremity swelling/tenderness, SOB, or CP today. Headaches described seem occasional and stress related, improve with Tylenol, will monitor.  - Continue holding off on anticoagulation at this time

## 2023-05-18 NOTE — Assessment & Plan Note (Signed)
Patient discharged with Hgb 8.7. She is taking daily iron supplementation, senokot as needed for constipation, and Tylenol for cramp-like pains (takes as needed, sometimes 2, 500 mg a day). Denies ibuprofen/NSAID use. She endorses fatigue, occasional tension headaches (for which she also takes Tylenol), and vaginal spotting. Per patient, vaginal bleeding has greatly improved since uterine artery embolization. She is still having to wear protective pads but does not have to change them out as often. She was recently seen by Dr. Donavan Foil at the [facility] on 10/14 where they discussed how she's doing. She will have close follow up in December and can discuss next steps, including possibility of hysterectomy. Repeat Hgb today 11.4, platelets 817. Platelets have not been this elevated in the past, likely a reactive process and/or due to ongoing iron deficiency. Too soon to recheck iron panels, will have patient return for lab visit to check iron/TIBC/ferritin and CBC. Will also follow up on outpatient referral for iron infusions.  - Continue ferrous sulfate 325 mg daily  - Return around 11/14 for iron panel and CBC labs - Follow up on outpatient iron infusion referral - Continue to follow up with OBGYN

## 2023-05-20 NOTE — Progress Notes (Signed)
Internal Medicine Clinic Attending  I was physically present during the key portions of the resident provided service and participated in the medical decision making of patient's management care. I reviewed pertinent patient test results.  The assessment, diagnosis, and plan were formulated together and I agree with the documentation in the resident's note.  Erlinda Hong, MD FACP

## 2023-05-22 ENCOUNTER — Telehealth: Payer: Self-pay

## 2023-05-22 NOTE — Telephone Encounter (Signed)
Pt called stating that she needed to pick up a copy of her FMLA paperwork.  Called patient and informed her that she is able to pick up a copy of her paperwork from front desk. Patient understood.   [name], CMA

## 2023-05-24 ENCOUNTER — Ambulatory Visit
Admission: RE | Admit: 2023-05-24 | Discharge: 2023-05-24 | Disposition: A | Discharge status: Home / Self Care | Payer: Managed Care, Other (non HMO) | Source: Ambulatory Visit | Attending: Radiology | Admitting: Radiology

## 2023-05-24 DIAGNOSIS — N939 Abnormal uterine and vaginal bleeding, unspecified: Secondary | ICD-10-CM

## 2023-05-24 HISTORY — PX: IR RADIOLOGIST EVAL & MGMT: IMG5224

## 2023-05-24 NOTE — Progress Notes (Signed)
Referring Physician(s): Omohundro,Jennifer C  Supervising Physician: {Supervising Physician:[id number]}  Chief Complaint: The patient is seen in follow up today s/p ***  History of present illness:  ***  Past Medical History:  Diagnosis Date   Chronic kidney disease    kidney stone   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   Qualifier: Diagnosis of   By: Levada Schilling       Mild concussion 12/05/2016    Past Surgical History:  Procedure Laterality Date   IR ANGIOGRAM PELVIS SELECTIVE OR SUPRASELECTIVE  04/25/2023   IR ANGIOGRAM SELECTIVE EACH ADDITIONAL VESSEL  04/25/2023   IR ANGIOGRAM SELECTIVE EACH ADDITIONAL VESSEL  04/25/2023   IR ANGIOGRAM SELECTIVE EACH ADDITIONAL VESSEL  04/25/2023   IR EMBO ART  VEN HEMORR LYMPH EXTRAV  INC GUIDE ROADMAPPING  04/25/2023   IR RADIOLOGIST EVAL & MGMT  05/24/2023   IR US GUIDE VASC ACCESS LEFT  04/25/2023   TUBAL LIGATION      Allergies: Feraheme [ferumoxytol]  Medications: Prior to Admission medications   Medication Sig Start Date End Date Taking? Authorizing Provider  acetaminophen (TYLENOL) 500 MG tablet Take 500-1,000 mg by mouth as needed for moderate pain.    [provider]  ferrous sulfate (FEROSUL) 325 (65 FE) MG tablet Take 1 tablet (325 mg total) by mouth daily. 04/27/23   Rana Snare, DO  senna-docusate (SENOKOT-S) 8.6-50 MG tablet Take 1 tablet by mouth daily as needed for mild constipation. 04/27/23   Rana Snare, DO     Family History  Problem Relation Age of Onset   Breast cancer Maternal Aunt    Cancer Maternal Aunt        breast   Cancer Maternal Uncle    Breast cancer Maternal Grandmother    Cancer Maternal Grandmother    Hearing loss Neg Hx     Social History   Socioeconomic History   Marital status: Single    Spouse name: Not on file   Number of children: Not on file   Years of education: Not on file   Highest education level: Not on file  Occupational History   Not on file  Tobacco Use    Smoking status: Every Day    Current packs/day: 0.25    Average packs/day: 0.3 packs/day for 10.0 years (2.5 ttl pk-yrs)    Types: Cigarettes   Smokeless tobacco: Never   Tobacco comments:    Stopped 11/19/2022   Vaping Use   Vaping status: Some Days   Substances: Nicotine  Substance and Sexual Activity   Alcohol use: No   Drug use: No   Sexual activity: Yes    Birth control/protection: Surgical  Other Topics Concern   Not on file  Social History Narrative   Not on file   Social Determinants of Health   Financial Resource Strain: Not on file  Food Insecurity: No Food Insecurity (04/24/2023)   Hunger Vital Sign    Worried About Running Out of Food in the Last Year: Never true    Ran Out of Food in the Last Year: Never true  Transportation Needs: No Transportation Needs (04/24/2023)   PRAPARE - Administrator, Civil Service (Medical): No    Lack of Transportation (Non-Medical): No  Physical Activity: Not on file  Stress: Not on file  Social Connections: Moderately Isolated (11/26/2022)   Social Connection and Isolation Panel [NHANES]    Frequency of Communication with Friends and Family: More than three times a week    Frequency of  Social Gatherings with Friends and Family: More than three times a week    Attends Religious Services: Never    Database administrator or Organizations: No    Attends Banker Meetings: Never    Marital Status: Living with partner     Vital Signs: LMP 03/31/2023 (Exact Date) Comment: has been bleeding continuous  Physical Exam  Imaging: IR Radiologist Eval & Mgmt  Result Date: 05/24/2023 EXAM: ESTABLISHED PATIENT OFFICE VISIT CHIEF COMPLAINT: See below HISTORY OF PRESENT ILLNESS: See below REVIEW OF SYSTEMS: See below PHYSICAL EXAMINATION: See below ASSESSMENT AND PLAN: Please refer to completed note in the electronic medical record on [place] [facility] Roanna Banning, MD [facility]  [facility] Electronically Signed   By: Roanna Banning M.D.   On: 05/24/2023 09:05      IR Uterine Artery Embolization, 04/25/23  Labs:  CBC: Recent Labs    04/25/23 0645 04/26/23 0436 04/27/23 0255 05/16/23 1435  WBC 10.5 15.5* 10.1 5.1  HGB 7.3* 8.3* 8.7* 11.4  HCT 22.9* 25.5* 27.4* 35.5  PLT 237 194 191 817*    COAGS: Recent Labs    04/25/23 1501  INR 1.1    BMP: Recent Labs    03/07/23 1244 04/23/23 0829 04/24/23 0434 04/26/23 0436  NA 138 136 137 139  K 3.6 3.6 3.7 3.6  CL 106 104 107 109  CO2 24 19* 21* 22  GLUCOSE 99 118* 101* 104*  BUN 10 8 10 8   CALCIUM 9.0 8.4* 8.3* 8.6*  CREATININE 0.87 0.80 0.67 0.79  GFRNONAA >60 >60 >60 >60    LIVER FUNCTION TESTS: Recent Labs    11/20/22 1045 04/23/23 0829  BILITOT 0.4 0.7  AST 20 29  ALT 25 25  ALKPHOS 41 32*  PROT 6.5 6.1*  ALBUMIN 3.3* 3.3*    Assessment and Plan:  ***  Electronically Signed: Roanna Banning 05/24/2023, 5:09 PM   I spent a total of {Established Out-Pt:[id number]} in face to face in clinical consultation, greater than 50% of which was counseling/coordinating care for ***

## 2023-05-30 ENCOUNTER — Other Ambulatory Visit (INDEPENDENT_AMBULATORY_CARE_PROVIDER_SITE_OTHER): Payer: Managed Care, Other (non HMO)

## 2023-05-30 DIAGNOSIS — N939 Abnormal uterine and vaginal bleeding, unspecified: Secondary | ICD-10-CM

## 2023-05-30 DIAGNOSIS — D75839 Thrombocytosis, unspecified: Secondary | ICD-10-CM

## 2023-05-30 DIAGNOSIS — D509 Iron deficiency anemia, unspecified: Secondary | ICD-10-CM

## 2023-05-31 LAB — CBC
Hematocrit: 40 % (ref 34.0–46.6)
Hemoglobin: 12.6 g/dL (ref 11.1–15.9)
MCH: 27.8 pg (ref 26.6–33.0)
MCHC: 31.5 g/dL (ref 31.5–35.7)
MCV: 88 fL (ref 79–97)
Platelets: 182 10*3/uL (ref 150–450)
RBC: 4.53 x10E6/uL (ref 3.77–5.28)
RDW: 17.7 % — ABNORMAL HIGH (ref 11.7–15.4)
WBC: 6.3 10*3/uL (ref 3.4–10.8)

## 2023-05-31 LAB — IRON,TIBC AND FERRITIN PANEL
Ferritin: 129 ng/mL (ref 15–150)
Iron Saturation: 12 % — ABNORMAL LOW (ref 15–55)
Iron: 43 ug/dL (ref 27–159)
Total Iron Binding Capacity: 350 ug/dL (ref 250–450)
UIBC: 307 ug/dL (ref 131–425)

## 2023-06-17 ENCOUNTER — Encounter: Payer: Self-pay | Admitting: Obstetrics and Gynecology

## 2023-06-17 ENCOUNTER — Ambulatory Visit: Payer: Managed Care, Other (non HMO) | Admitting: Obstetrics and Gynecology

## 2023-06-17 ENCOUNTER — Other Ambulatory Visit: Payer: Self-pay

## 2023-06-17 VITALS — BP 122/86 | HR 82 | Ht 64.0 in | Wt 158.7 lb

## 2023-06-17 DIAGNOSIS — D250 Submucous leiomyoma of uterus: Secondary | ICD-10-CM

## 2023-06-17 DIAGNOSIS — D251 Intramural leiomyoma of uterus: Secondary | ICD-10-CM | POA: Diagnosis not present

## 2023-06-17 DIAGNOSIS — N939 Abnormal uterine and vaginal bleeding, unspecified: Secondary | ICD-10-CM

## 2023-06-17 NOTE — Progress Notes (Signed)
11/22 LMP 5 days  CC: post uterine embolization Subjective:    Patient ID: SANDEEP FORESTIER, female    DOB: 30-Dec-1980, 42 y.o.   MRN: [id number]  HPI 42 yo G4P4 seen for discussion of symptoms s/p UFE.  Pt had virtual visit with interventional radiologist who noted excellent treatment prognosis.  Per pt she had 5 day light LMP on 06/07/23.  Discussed with patient fibroids are currently treated and usually bleeding responds quickly.   Review of Systems     Objective:   Physical Exam Vitals:   06/17/23 1101  BP: 122/86  Pulse: 82         Assessment & Plan:   1. Intramural and submucous leiomyoma of uterus S/p UFE with apparent good results  2. Abnormal uterine bleeding Expectant management for now since uterine bleeding has improved.  I spent 10 minutes dedicated to the care of this patient including previsit review of records, face to face time with the patient discussing current treatment plan and post visit testing.     Warden Fillers, MD Faculty Attending, [facility]

## 2023-12-11 ENCOUNTER — Encounter: Payer: Self-pay | Admitting: Student

## 2023-12-11 ENCOUNTER — Emergency Department (EMERGENCY_DEPARTMENT_HOSPITAL)
Admit: 2023-12-11 | Discharge: 2023-12-11 | Disposition: A | Discharge status: Home / Self Care | Attending: Emergency Medicine | Admitting: Emergency Medicine

## 2023-12-11 ENCOUNTER — Ambulatory Visit (INDEPENDENT_AMBULATORY_CARE_PROVIDER_SITE_OTHER): Admitting: Student

## 2023-12-11 ENCOUNTER — Emergency Department (HOSPITAL_COMMUNITY)

## 2023-12-11 ENCOUNTER — Other Ambulatory Visit: Payer: Self-pay

## 2023-12-11 ENCOUNTER — Encounter (HOSPITAL_COMMUNITY): Payer: Self-pay

## 2023-12-11 ENCOUNTER — Emergency Department (HOSPITAL_COMMUNITY)
Admission: EM | Admit: 2023-12-11 | Discharge: 2023-12-11 | Disposition: A | Discharge status: Home / Self Care | Attending: Emergency Medicine | Admitting: Emergency Medicine

## 2023-12-11 VITALS — BP 128/80 | HR 88 | Temp 98.1°F | Ht 64.0 in | Wt 173.6 lb

## 2023-12-11 DIAGNOSIS — I824Y2 Acute embolism and thrombosis of unspecified deep veins of left proximal lower extremity: Secondary | ICD-10-CM | POA: Diagnosis not present

## 2023-12-11 DIAGNOSIS — Z789 Other specified health status: Secondary | ICD-10-CM

## 2023-12-11 DIAGNOSIS — R079 Chest pain, unspecified: Secondary | ICD-10-CM | POA: Insufficient documentation

## 2023-12-11 DIAGNOSIS — M79605 Pain in left leg: Secondary | ICD-10-CM | POA: Diagnosis not present

## 2023-12-11 DIAGNOSIS — M79662 Pain in left lower leg: Secondary | ICD-10-CM

## 2023-12-11 DIAGNOSIS — Z7901 Long term (current) use of anticoagulants: Secondary | ICD-10-CM | POA: Insufficient documentation

## 2023-12-11 DIAGNOSIS — R0602 Shortness of breath: Secondary | ICD-10-CM | POA: Insufficient documentation

## 2023-12-11 LAB — CBC
HCT: 36.9 % (ref 36.0–46.0)
Hemoglobin: 12.2 g/dL (ref 12.0–15.0)
MCH: 27.3 pg (ref 26.0–34.0)
MCHC: 33.1 g/dL (ref 30.0–36.0)
MCV: 82.6 fL (ref 80.0–100.0)
Platelets: 249 10*3/uL (ref 150–400)
RBC: 4.47 MIL/uL (ref 3.87–5.11)
RDW: 15.7 % — ABNORMAL HIGH (ref 11.5–15.5)
WBC: 4.1 10*3/uL (ref 4.0–10.5)
nRBC: 0 % (ref 0.0–0.2)

## 2023-12-11 LAB — BASIC METABOLIC PANEL WITH GFR
Anion gap: 7 (ref 5–15)
BUN: 9 mg/dL (ref 6–20)
CO2: 25 mmol/L (ref 22–32)
Calcium: 9.3 mg/dL (ref 8.9–10.3)
Chloride: 105 mmol/L (ref 98–111)
Creatinine, Ser: 0.8 mg/dL (ref 0.44–1.00)
GFR, Estimated: >60 mL/min (ref >60)
Glucose, Bld: 100 mg/dL — ABNORMAL HIGH (ref 70–99)
Potassium: 3.6 mmol/L (ref 3.5–5.1)
Sodium: 137 mmol/L (ref 135–145)

## 2023-12-11 LAB — HCG, SERUM, QUALITATIVE: Preg, Serum: NEGATIVE

## 2023-12-11 LAB — TROPONIN I (HIGH SENSITIVITY)
Troponin I (High Sensitivity): <2 ng/L (ref <18)
Troponin I (High Sensitivity): <2 ng/L (ref <18)

## 2023-12-11 MED ORDER — APIXABAN 2.5 MG PO TABS: 5.0000 mg | ORAL_TABLET | Freq: Two times a day (BID) | ORAL | Status: DC

## 2023-12-11 MED ORDER — APIXABAN (ELIQUIS) VTE STARTER PACK (10MG AND 5MG): ORAL_TABLET | ORAL | 0 refills | Status: DC

## 2023-12-11 MED ORDER — MEDROXYPROGESTERONE ACETATE 104 MG/0.65ML ~~LOC~~ SUSY
104.0000 mg | PREFILLED_SYRINGE | Freq: Once | SUBCUTANEOUS | Status: AC
Start: 2023-12-11 — End: 2023-12-11
  Administered 2023-12-11: 104 mg via SUBCUTANEOUS

## 2023-12-11 MED ORDER — IOHEXOL 350 MG/ML SOLN
75.0000 mL | Freq: Once | INTRAVENOUS | Status: AC | PRN
Start: 2023-12-11 — End: 2023-12-11
  Administered 2023-12-11: 75 mL via INTRAVENOUS

## 2023-12-11 MED ORDER — APIXABAN 2.5 MG PO TABS
10.0000 mg | ORAL_TABLET | Freq: Two times a day (BID) | ORAL | Status: DC
  Administered 2023-12-11: 10 mg via ORAL
  Filled 2023-12-11: qty 4

## 2023-12-11 NOTE — Progress Notes (Signed)
 Left lower extremity venous duplex has been completed. Preliminary results can be found in CV Proc through chart review.  Results were given to Valentina Gasman PA.  12/11/23 5:20 PM Birda Buffy RVT

## 2023-12-11 NOTE — Progress Notes (Signed)
   CC: Left leg pain  HPI:  [name] is a 43 y.o. female with past medical history of prior DVT, abnormal uterine bleeding, iron  deficiency anemia who presents for concerns of swelling and mobility.  Please see assessment and plan for full HPI.  Past Medical History:  Diagnosis Date   Chronic kidney disease    kidney stone   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   Qualifier: Diagnosis of   By: WATT, JOANNE       Mild concussion 12/05/2016     Current Outpatient Medications:    acetaminophen  (TYLENOL ) 500 MG tablet, Take 500-1,000 mg by mouth as needed for moderate pain. (Patient not taking: Reported on 06/17/2023), Disp: , Rfl:    ferrous sulfate  (FEROSUL) 325 (65 FE) MG tablet, Take 1 tablet (325 mg total) by mouth daily. (Patient not taking: Reported on 06/17/2023), Disp: 100 tablet, Rfl: 0   senna-docusate (SENOKOT-S) 8.6-50 MG tablet, Take 1 tablet by mouth daily as needed for mild constipation. (Patient not taking: Reported on 06/17/2023), Disp: 5 tablet, Rfl: 0  Review of Systems:   Negative except for what is stated in HPI  Physical Exam:  Vitals:   12/11/23 1421  BP: 128/80  Pulse: 88  Temp: 98.1 F (36.7 C)  TempSrc: Oral  SpO2: 100%  Weight: 173 lb 9.6 oz (78.7 kg)  Height: 5\' 4"  (1.626 m)   General: Patient is sitting comfortably in the room  Head: Normocephalic, atraumatic  Cardio: Regular rate and rhythm, no murmurs, rubs or gallops Pulmonary: Clear to ausculation bilaterally with no rales, rhonchi, and crackles  MSK: Bilateral lower extremities with no obvious swelling noted.  Warmth noted to bilateral lower extremities.  Left lower extremity with tenderness to palpation at calf.  Assessment & Plan:   Left leg pain This is a 43 year old female with a past medical history of a previous DVT and previously had a PE with concerns of left leg pain over the past few weeks that has been more intense.  She also reports having shortness of breath with exertion.   She also had reported intermittent chest pain however resolves very quickly.  She states it has become more intense and therefore presents today.  She has not had any recent car rides or plane rides.  She does endorse that her leg does get swollen at times.  She endorses that this pain feels like the same pain she had when she had her first DVT.  Previously, patient was on Eliquis  but due to uterine fibroids and abnormal uterine bleeding, patient had to stop Eliquis .  Patient had uterine fibroid embolization and has had normal periods since.  On my exam, patient does not have any obvious swelling, but does have left calf tenderness.  I do wonder if patient is developing another DVT.  With her shortness of breath and chest pain I do wonder if she has had clot that has been lodged into her pulmonary arterial system.  Will send patient to emergency department for expedited workup.  Plan: - Sent patient to the emergency department for expedited workup for concern for PE or DVT - Recommend ED provider get ultrasound of bilateral lower extremities to rule out DVT and get CTA chest for PE rule out - I did write the patient for a Depo-Provera  shot and administered it in the clinic today in anticipation of potentially restarting anticoagulation  Patient discussed with Dr. Sonia Durand, DO PGY-2 Internal Medicine Resident

## 2023-12-11 NOTE — ED Triage Notes (Signed)
 Left leg pain for 2 weeks and sob when walking for 2 weeks. Pt has hx of DVT and PE, is not on blood thinners.

## 2023-12-11 NOTE — Discharge Instructions (Addendum)
 Evaluation revealed that you do have a DVT and it appears to be acute in your left lower leg.  I am starting you back on Eliquis .  Please follow-up with your PCP.  If you develop chest pain shortness of breath or any other concerning symptom please return to the ED for further evaluation.  Information on my medicine - ELIQUIS  (apixaban )  Why was Eliquis  prescribed for you? Eliquis  was prescribed to treat blood clots that may have been found in the veins of your legs (deep vein thrombosis) or in your lungs (pulmonary embolism) and to reduce the risk of them occurring again.  What do You need to know about Eliquis  ? The starting dose is 10 mg (two 5 mg tablets) taken TWICE daily for the FIRST SEVEN (7) DAYS, then on 12/19/23 (or following the instructions in your starter pack)  the dose is reduced to ONE 5 mg tablet taken TWICE daily.  Eliquis  may be taken with or without food.   Try to take the dose about the same time in the morning and in the evening. If you have difficulty swallowing the tablet whole please discuss with your pharmacist how to take the medication safely.  Take Eliquis  exactly as prescribed and DO NOT stop taking Eliquis  without talking to the doctor who prescribed the medication.  Stopping may increase your risk of developing a new blood clot.  Refill your prescription before you run out.  After discharge, you should have regular check-up appointments with your healthcare provider that is prescribing your Eliquis .    What do you do if you miss a dose? If a dose of ELIQUIS  is not taken at the scheduled time, take it as soon as possible on the same day and twice-daily administration should be resumed. The dose should not be doubled to make up for a missed dose.  Important Safety Information A possible side effect of Eliquis  is bleeding. You should call your healthcare provider right away if you experience any of the following: Bleeding from an injury or your nose that  does not stop. Unusual colored urine (red or dark brown) or unusual colored stools (red or black). Unusual bruising for unknown reasons. A serious fall or if you hit your head (even if there is no bleeding).  Some medicines may interact with Eliquis  and might increase your risk of bleeding or clotting while on Eliquis . To help avoid this, consult your healthcare provider or pharmacist prior to using any new prescription or non-prescription medications, including herbals, vitamins, non-steroidal anti-inflammatory drugs (NSAIDs) and supplements.  This website has more information on Eliquis  (apixaban ): [email or web address] .com/eliquis Romaine Closs

## 2023-12-11 NOTE — ED Provider Notes (Signed)
 [place] EMERGENCY DEPARTMENT AT [facility] Provider Note   CSN: [id number] Arrival date & time: 12/11/23  1546     History  Chief Complaint  Patient presents with   Leg Pain   HPI Sonya Garner is a 43 y.o. 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 Pain is in the lower left calf and started about 2 weeks ago.  She states this feels very similar to when she has had a DVT in the past.  Also endorsing exertional shortness of breath and chest pain in the center of her chest that feels sharp and not necessarily worse with deep breathing.  She reports she was taken off Eliquis  due to abnormal uterine bleeding.  She also reports that the PE and DVT were unprovoked to her knowledge.   Leg Pain      Home Medications Prior to Admission medications   Medication Sig Start Date End Date Taking? Authorizing Provider  APIXABAN  (ELIQUIS ) VTE STARTER PACK (10MG  AND 5MG ) Take as directed on package: start with two-5mg  tablets twice daily for 7 days. On day 8, switch to one-5mg  tablet twice daily. 12/11/23  Yes Janalee Mcmurray, PA-C      Allergies    Feraheme [ferumoxytol ]    Review of Systems   See HPI   Physical Exam   Vitals:   12/11/23 1945 12/11/23 2011  BP: 119/81   Pulse: 79   Resp: 18   Temp:  98.1 F (36.7 C)  SpO2: 97%     CONSTITUTIONAL:  well-appearing, NAD NEURO:  Alert and oriented x 3, CN 3-12 grossly intact EYES:  eyes equal and reactive ENT/NECK:  Supple, no stridor  CARDIO: Regular rate and rhythm, appears well-perfused, radial and pedal pulses are 2+ bilaterally PULM:  No respiratory distress, CTAB GI/GU:  non-distended, soft MSK/SPINE:  No gross deformities, no edema, moves all extremities.  Left calf tenderness.  No ecchymosis or erythema noted. SKIN:  no rash, atraumatic  *Additional and/or pertinent findings included in MDM  below   ED Results / Procedures / Treatments   Labs (all labs ordered are listed, but only abnormal results are displayed) Labs Reviewed  BASIC METABOLIC PANEL WITH GFR - Abnormal; Notable for the following components:      Result Value   Glucose, Bld 100 (*)    All other components within normal limits  CBC - Abnormal; Notable for the following components:   RDW 15.7 (*)    All other components within normal limits  HCG, SERUM, QUALITATIVE  TROPONIN I (HIGH SENSITIVITY)  TROPONIN I (HIGH SENSITIVITY)    EKG None  Radiology CT Angio Chest Pulmonary Embolism (PE) W or WO Contrast Result Date: 12/11/2023 CLINICAL DATA:  Left leg pain x2 weeks. EXAM: CT ANGIOGRAPHY CHEST WITH CONTRAST TECHNIQUE: Multidetector CT imaging of the chest was performed using the standard protocol during bolus administration of intravenous contrast. Multiplanar CT image reconstructions and MIPs were obtained to evaluate the vascular anatomy. RADIATION DOSE REDUCTION: This exam was performed according to the departmental dose-optimization program which includes automated exposure control, adjustment of the mA and/or kV according to patient size and/or use of iterative reconstruction technique. CONTRAST:  75mL OMNIPAQUE  IOHEXOL  350 MG/ML SOLN COMPARISON:  April 23, 2023 FINDINGS: Cardiovascular: The thoracic aorta is unremarkable. Satisfactory opacification of the pulmonary arteries to the segmental level. No evidence of pulmonary embolism. Normal heart size. No pericardial  effusion. Mediastinum/Nodes: No enlarged mediastinal, hilar, or axillary lymph nodes. Thyroid gland, trachea, and esophagus demonstrate no significant findings. Lungs/Pleura: Very mild anteromedial right basilar scarring and/or atelectasis is noted. There is no evidence of acute infiltrate, pleural effusion or pneumothorax. Upper Abdomen: No acute abnormality. Musculoskeletal: No chest wall abnormality. No acute or significant osseous findings.  Review of the MIP images confirms the above findings. IMPRESSION: 1. No evidence of pulmonary embolism or other acute intrathoracic process. Electronically Signed   By: Virgle Grime M.D.   On: 12/11/2023 19:14   VAS US  LOWER EXTREMITY VENOUS (DVT) (7a-7p) Result Date: 12/11/2023  Lower Venous DVT Study Patient Name:  Sonya Garner  Date of Exam:   12/11/2023 Medical Rec #: [id number]         Accession #:    [id number] Date of Birth: 04-11-1981         Patient Gender: F Patient Age:   59 years Exam Location:  [facility] Procedure:      VAS US  LOWER EXTREMITY VENOUS (DVT) Referring Phys: Valentina Gasman --------------------------------------------------------------------------------  Indications: Pain.  Risk Factors: DVT. Comparison Study: 11/20/2022 - RIGHT:                   - No evidence of common femoral vein obstruction.                    LEFT:                   - Findings consistent with acute deep vein thrombosis                   involving the left                   popliteal vein, left gastrocnemius veins, left posterior                   tibial veins, and                   left peroneal veins.                   - No cystic structure found in the popliteal fossa.                    04/23/2023 - RIGHT:                   - No evidence of common femoral vein obstruction.                    LEFT:                    - There is recanalized thrombus in the left popliteal vein(s).                   There is recanalized thrombus in the left popliteal vein(s).                    - No cystic structure found in the popliteal fossa. Performing Technologist: Lerry Ransom RVT  Examination Guidelines: A complete evaluation includes B-mode imaging, spectral Doppler, color Doppler, and power Doppler as needed of all accessible portions of each vessel. Bilateral testing is considered an integral part of a complete examination. Limited examinations for reoccurring indications may be performed as noted. The  reflux portion of the exam is performed with the patient in  reverse Trendelenburg.  +-----+---------------+---------+-----------+----------+--------------+ RIGHTCompressibilityPhasicitySpontaneityPropertiesThrombus Aging +-----+---------------+---------+-----------+----------+--------------+ CFV  Full           Yes      Yes                                 +-----+---------------+---------+-----------+----------+--------------+   +---------+---------------+---------+-----------+----------+--------------+ LEFT     CompressibilityPhasicitySpontaneityPropertiesThrombus Aging +---------+---------------+---------+-----------+----------+--------------+ CFV      Full           Yes      Yes                                 +---------+---------------+---------+-----------+----------+--------------+ SFJ      Full                                                        +---------+---------------+---------+-----------+----------+--------------+ FV Prox  Full                                                        +---------+---------------+---------+-----------+----------+--------------+ FV Mid   Full                                                        +---------+---------------+---------+-----------+----------+--------------+ FV DistalFull                                                        +---------+---------------+---------+-----------+----------+--------------+ PFV      Full                                                        +---------+---------------+---------+-----------+----------+--------------+ POP      Partial        Yes      Yes                  Chronic        +---------+---------------+---------+-----------+----------+--------------+ PTV      Full                                                        +---------+---------------+---------+-----------+----------+--------------+ PERO     Full                                                         +---------+---------------+---------+-----------+----------+--------------+  Summary: RIGHT: - No evidence of common femoral vein obstruction.   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  - No cystic structure found in the popliteal fossa.  *See table(s) above for measurements and observations. Electronically signed by Genny Kid MD on 12/11/2023 at 6:05:55 PM.    Final    DG Chest Port 1 View Result Date: 12/11/2023 CLINICAL DATA:  Chest pain.  Shortness of breath. EXAM: PORTABLE CHEST 1 VIEW COMPARISON:  04/23/2023 FINDINGS: The cardiomediastinal contours are normal. Minimal hazy opacity at the right costophrenic angle. Pulmonary vasculature is normal. No pneumothorax. No significant pleural effusion. No acute osseous abnormalities are seen. IMPRESSION: Minimal hazy opacity at the right costophrenic angle, may represent atelectasis/airspace disease or overlapping soft tissues. Electronically Signed   By: Chadwick Colonel M.D.   On: 12/11/2023 17:40    Procedures Procedures    Medications Ordered in ED Medications  apixaban  (ELIQUIS ) tablet 10 mg (has no administration in time range)    Followed by  apixaban  (ELIQUIS ) tablet 5 mg (has no administration in time range)  iohexol  (OMNIPAQUE ) 350 MG/ML injection 75 mL (75 mLs Intravenous Contrast Given 12/11/23 1850)    ED Course/ Medical Decision Making/ A&P                                 Medical Decision Making Amount and/or Complexity of Data Reviewed Labs: ordered. Radiology: ordered.  Risk Prescription drug management.   Initial Impression and Ddx 43 year old female presenting for left leg pain and swelling, chest pain shortness of breath.  Exam notable for left calf tenderness but otherwise unremarkable.  DDx includes PE, DVT, cellulitis, ACS, pneumothorax, aortic dissection, symptomatic anemia, other. Patient PMH that increases complexity of ED encounter:   history of bilateral PEs and left lower extremity DVT last year not on anticoagulation, iron  deficient anemia   Interpretation of Diagnostics - I independent reviewed and interpreted the labs as followed: none  - I independently visualized the following imaging with scope of interpretation limited to determining acute life threatening conditions related to emergency care: CT angio PE, which revealed negative for PE, LLE US  revealed findings consistent with acute DVT. See details above. Shared findings with patient  - I personally reviewed and interpreted EKG which revealed sinus rhythm  Patient Reassessment and Ultimate Disposition/Management Workup suggestive of acute DVT in the left lower extremity but no PE.  Per chart review, Eliquis  was discontinued due to abnormal uterine bleeding in October of last year which was treated by uterine artery embolization.  Given her history of DVT and bilateral PEs, feel it is indicated to restart her on Eliquis .  She looks well, no acute distress and hemoglobin stable.  Chest pain resolved on reassessment.  Feel she is safe to go home.  Sent Eliquis  to her pharmacy.  Discussed return precautions.  Discharged good condition.  Patient management required discussion with the following services or consulting groups:  None  Complexity of Problems Addressed Acute complicated illness or Injury  Additional Data Reviewed and Analyzed Further history obtained from: Past medical history and medications listed in the EMR and Prior ED visit notes  Patient Encounter Risk Assessment Prescriptions and Consideration of hospitalization         Final Clinical Impression(s) / ED Diagnoses Final diagnoses:  Acute deep vein thrombosis (DVT) of proximal vein of left lower extremity (HCC)    Rx /  DC Orders ED Discharge Orders          Ordered    APIXABAN  (ELIQUIS ) VTE STARTER PACK (10MG  AND 5MG )       Note to Pharmacy: If starter pack unavailable, substitute  with seventy-four 5 mg apixaban  tabs following the above SIG directions.   12/11/23 2028              Janalee Mcmurray, PA-C 12/11/23 2029    Tegeler, Marine Sia, MD 12/11/23 [phone]

## 2023-12-11 NOTE — Addendum Note (Signed)
 Addended by: Aarsh Fristoe on: 12/11/2023 03:51 PM   Modules accepted: Orders

## 2023-12-11 NOTE — Assessment & Plan Note (Addendum)
 This is a 43 year old female with a past medical history of a previous DVT and previously had a PE with concerns of left leg pain over the past few weeks that has been more intense.  She also reports having shortness of breath with exertion.  She also had reported intermittent chest pain however resolves very quickly.  She states it has become more intense and therefore presents today.  She has not had any recent car rides or plane rides.  She does endorse that her leg does get swollen at times.  She endorses that this pain feels like the same pain she had when she had her first DVT.  Previously, patient was on Eliquis  but due to uterine fibroids and abnormal uterine bleeding, patient had to stop Eliquis .  Patient had uterine fibroid embolization and has had normal periods since.  On my exam, patient does not have any obvious swelling, but does have left calf tenderness.  I do wonder if patient is developing another DVT.  With her shortness of breath and chest pain I do wonder if she has had clot that has been lodged into her pulmonary arterial system.  Will send patient to emergency department for expedited workup.  Plan: - Sent patient to the emergency department for expedited workup for concern for PE or DVT - Recommend ED provider get ultrasound of bilateral lower extremities to rule out DVT and get CTA chest for PE rule out - I did write the patient for a Depo-Provera shot and administered it in the clinic today in anticipation of potentially restarting anticoagulation

## 2023-12-11 NOTE — Assessment & Plan Note (Signed)
>>  ASSESSMENT AND PLAN FOR LEFT LEG PAIN WRITTEN ON 12/11/2023  3:20 PM BY PATEL, AMAR, DO  This is a 43 year old female with a past medical history of a previous DVT and previously had a PE with concerns of left leg pain over the past few weeks that has been more intense.  She also reports having shortness of breath with exertion.  She also had reported intermittent chest pain however resolves very quickly.  She states it has become more intense and therefore presents today.  She has not had any recent car rides or plane rides.  She does endorse that her leg does get swollen at times.  She endorses that this pain feels like the same pain she had when she had her first DVT.  Previously, patient was on Eliquis  but due to uterine fibroids and abnormal uterine bleeding, patient had to stop Eliquis .  Patient had uterine fibroid embolization and has had normal periods since.  On my exam, patient does not have any obvious swelling, but does have left calf tenderness.  I do wonder if patient is developing another DVT.  With her shortness of breath and chest pain I do wonder if she has had clot that has been lodged into her pulmonary arterial system.  Will send patient to emergency department for expedited workup.  Plan: - Sent patient to the emergency department for expedited workup for concern for PE or DVT - Recommend ED provider get ultrasound of bilateral lower extremities to rule out DVT and get CTA chest for PE rule out - I did write the patient for a Depo-Provera  shot and administered it in the clinic today in anticipation of potentially restarting anticoagulation

## 2023-12-11 NOTE — Patient Instructions (Addendum)
 Sonya Garner [facility] you for allowing me to take part in your care today.  Here are your instructions.  1.  Please go to the emergency department.  They will do all of your imaging there.  I will send a note to them.  Let them know your primary care provider sent you.  2.  We have given you your Depo shot.   PLEASE BRING YOUR MEDICATIONS TO EVERY APPOINTMENT  Thank you, Dr. Lydia Sams  If you have any other questions please contact the internal medicine clinic at [phone] If it is after hours, please call the [place] [facility] at [phone] and then ask the person who picks up for the resident on call.

## 2023-12-12 ENCOUNTER — Encounter (HOSPITAL_COMMUNITY): Payer: Self-pay

## 2023-12-12 ENCOUNTER — Inpatient Hospital Stay: Payer: Self-pay | Admitting: Student

## 2023-12-12 ENCOUNTER — Ambulatory Visit: Payer: Self-pay

## 2023-12-12 ENCOUNTER — Other Ambulatory Visit: Payer: Self-pay

## 2023-12-12 ENCOUNTER — Emergency Department (HOSPITAL_COMMUNITY)
Admission: EM | Admit: 2023-12-12 | Discharge: 2023-12-12 | Disposition: A | Discharge status: Home / Self Care | Attending: Emergency Medicine | Admitting: Emergency Medicine

## 2023-12-12 DIAGNOSIS — N189 Chronic kidney disease, unspecified: Secondary | ICD-10-CM | POA: Insufficient documentation

## 2023-12-12 DIAGNOSIS — Z87442 Personal history of urinary calculi: Secondary | ICD-10-CM | POA: Diagnosis not present

## 2023-12-12 DIAGNOSIS — Z7901 Long term (current) use of anticoagulants: Secondary | ICD-10-CM | POA: Diagnosis not present

## 2023-12-12 DIAGNOSIS — K625 Hemorrhage of anus and rectum: Secondary | ICD-10-CM | POA: Diagnosis present

## 2023-12-12 LAB — COMPREHENSIVE METABOLIC PANEL WITH GFR
ALT: 13 U/L (ref 0–44)
AST: 16 U/L (ref 15–41)
Albumin: 3.9 g/dL (ref 3.5–5.0)
Alkaline Phosphatase: 42 U/L (ref 38–126)
Anion gap: 7 (ref 5–15)
BUN: 9 mg/dL (ref 6–20)
CO2: 25 mmol/L (ref 22–32)
Calcium: 9.3 mg/dL (ref 8.9–10.3)
Chloride: 107 mmol/L (ref 98–111)
Creatinine, Ser: 0.84 mg/dL (ref 0.44–1.00)
GFR, Estimated: >60 mL/min (ref >60)
Glucose, Bld: 111 mg/dL — ABNORMAL HIGH (ref 70–99)
Potassium: 3.6 mmol/L (ref 3.5–5.1)
Sodium: 139 mmol/L (ref 135–145)
Total Bilirubin: 0.4 mg/dL (ref 0.0–1.2)
Total Protein: 7.1 g/dL (ref 6.5–8.1)

## 2023-12-12 LAB — CBC
HCT: 36.9 % (ref 36.0–46.0)
Hemoglobin: 12.3 g/dL (ref 12.0–15.0)
MCH: 27.3 pg (ref 26.0–34.0)
MCHC: 33.3 g/dL (ref 30.0–36.0)
MCV: 82 fL (ref 80.0–100.0)
Platelets: 273 10*3/uL (ref 150–400)
RBC: 4.5 MIL/uL (ref 3.87–5.11)
RDW: 15.5 % (ref 11.5–15.5)
WBC: 5 10*3/uL (ref 4.0–10.5)
nRBC: 0 % (ref 0.0–0.2)

## 2023-12-12 LAB — TYPE AND SCREEN
ABO/RH(D): O POS
Antibody Screen: NEGATIVE

## 2023-12-12 LAB — HCG, SERUM, QUALITATIVE: Preg, Serum: NEGATIVE

## 2023-12-12 LAB — POC OCCULT BLOOD, ED: Fecal Occult Bld: POSITIVE — AB

## 2023-12-12 NOTE — Progress Notes (Deleted)
   CC: ***  HPI:  [name] is a 43 y.o. female with a past medical history of prior DVT, prior pulmonary embolism who presents for concerns of bloody stools and abdominal pain.  Please see assessment and plan for full HPI.  Patient was seen in clinic by me yesterday for concerns of left lower leg pain and I sent her to the emergency department with concerns of potential DVT.  Patient went to the emergency department found to have chronic left DVT.  Fortunately no obvious PE on CT chest.  She was started on Sonya Garner .  Patient now presenting with concerns of bloody stools and abdominal pain.  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 On day 8, switch to one-5mg  tablet twice daily., Disp: 74 each, Rfl: 0  Review of Systems:  ***  Constitutional: Eye: Respiratory: Cardiovascular: GI: MSK: GU: Skin: Neuro: Endocrine:   Physical Exam:  There were no vitals filed for this visit. *** General: Patient is sitting comfortably in the room  Eyes: Pupils equal and reactive to light, EOM intact  Head: Normocephalic, atraumatic  Neck: Supple, nontender, full range of motion, No JVD Cardio: Regular rate and rhythm, no murmurs, rubs or gallops. 2+ pulses to bilateral upper and lower extremities  Chest: No chest tenderness Pulmonary: Clear to ausculation bilaterally with no rales, rhonchi, and crackles  Abdomen: Soft, nontender with normoactive bowel sounds with no rebound or guarding  Neuro: Alert and orientated x3. CN II-XII intact. Sensation intact to upper and lower  extremities. 2+ patellar reflex.  Back: No midline tenderness, no step off or deformities noted. No paraspinal muscle tenderness.  Skin: No rashes noted  MSK: 5/5 strength to upper and lower extremities.    Assessment & Plan:   No problem-specific Assessment & Plan notes found for this encounter.    Patient {GC/GE:[id number]::"discussed with","seen with"} Dr. {NAMES:[id number]::"Guilloud","Hoffman","Mullen","Narendra","Williams","Vincent"}  Jonelle Neri, DO PGY-2 Internal Medicine Resident

## 2023-12-12 NOTE — Discharge Instructions (Signed)
 Thank you for coming to [facility] Emergency Department. You were seen for bright red blood per rectum. We did an exam, labs, and these showed no acute findings. Please continue to take your Eliquis  as prescribed. You can call [place] [facility] to make a follow up appointment for within 2 weeks.   Do not hesitate to return to the ED or call 911 if you experience: -Worsening symptoms -Severe or worsening abdominal pain -Severe or worsening rectal bleeding -Lightheadedness, passing out -Shortness of breath -Fevers/chills -Anything else that concerns you

## 2023-12-12 NOTE — Telephone Encounter (Signed)
 Chief Complaint: bloody stools bright red mixed in then went to BR nd bright red stool. Not vainal blood Symptoms: 7/10 abd cramping Frequency: 0400 Pertinent Negatives: Patient denies vomiting Disposition: [] ED /[] Urgent Care (no appt availability in office) / [] Appointment(In office/virtual)/ []  [facility]/ [] Home Care/ [] Refused Recommended Disposition /[] [place] [facility]/ [x]  Follow-up with PCP Additional Notes: called [facility] and spoke with PCP regarding sx. Asked if pt should go to ED and PCP stated he will discuss with his attending and call me or pt back.  Copied from CRM [id number]. Topic: Clinical - Red Word Triage >> Dec 12, 2023  9:38 AM Danelle Dunning F wrote: Red Word that prompted transfer to Nurse Triage:   bloody stools; started at 4am; stomach pain Reason for Disposition  Taking Coumadin (warfarin) or other strong blood thinner, or known bleeding disorder (e.g., thrombocytopenia)  Answer Assessment - Initial Assessment Questions 1. SYMPTOM:  "What's the main symptom you're concerned about?" (e.g., pain, itching, swelling, rash)     Blood and  2. ONSET: "When did the this pain  start?"     0400 3. RECTAL PAIN: "Do you have any pain around your rectum?" "How bad is the pain?"  (Scale 0-10; or mild, moderate, severe)   - NONE (0): no pain   - MILD (1-3): doesn't interfere with normal activities    - MODERATE (4-7): interferes with normal activities or awakens from sleep, limping    - SEVERE (8-10): excruciating pain, unable to have a bowel movement      7 4. RECTAL ITCHING: "Do you have any itching in this area?" "How bad is the itching?"  (Scale 0-10; or mild, moderate, severe)   - NONE: no itching   - MILD: doesn't interfere with normal activities    - MODERATE-SEVERE: interferes with normal activities or awakens from sleep     none 5. CONSTIPATION: "Do you have constipation?" If Yes, ask: "How bad is it?"     no 6. CAUSE: "What do you think is causing the  anus symptoms?"     none 7. OTHER SYMPTOMS: "Do you have any other symptoms?"  (e.g., abdomen pain, fever, rectal bleeding, vomiting)     Cramping mid abd near navel  Answer Assessment - Initial Assessment Questions 1. APPEARANCE of BLOOD: "What color is it?" "Is it passed separately, on the surface of the stool, or mixed in with the stool?"      Bright red 2. AMOUNT: "How much blood was passed?"      Enough to make the water turn red 3. FREQUENCY: "How many times has blood been passed with the stools?"      Every time 4. ONSET: "When was the blood first seen in the stools?" (Days or weeks)      0400 this am  5. DIARRHEA: "Is there also some diarrhea?" If Yes, ask: "How many diarrhea stools in the past 24 hours?"      no 6. CONSTIPATION: "Do you have constipation?" If Yes, ask: "How bad is it?"     no 7. RECURRENT SYMPTOMS: "Have you had blood in your stools before?" If Yes, ask: "When was the last time?" and "What happened that time?"      no 8. BLOOD THINNERS: "Do you take any blood thinners?" (e.g., Coumadin/warfarin, Pradaxa/dabigatran, aspirin)     yes 9. OTHER SYMPTOMS: "Do you have any other symptoms?"  (e.g., abdomen pain, vomiting, dizziness, fever)     Constant abd cramping 7/10, nausea  Protocols  used: Rectal Symptoms-A-AH, Rectal Bleeding-A-AH

## 2023-12-12 NOTE — Telephone Encounter (Signed)
 Received a call from E2C2-[name] Stating that the patient called stating she is having abdominal pain and blood in her stool. Patient reports her pain is a 7/10 and reported that she has a lot of blood in her stool . Sonya Garner spoke with Dr.Patel and he agrees that she needs to go to the ED. I called the patient to let her know that her Doctor wants her to go to the ED. Patient agrees and will go to the ED.

## 2023-12-12 NOTE — Progress Notes (Signed)
 Internal Medicine Clinic Attending  Case discussed with the resident at the time of the visit.  We reviewed the resident's history and exam and pertinent patient test results.  I agree with the assessment, diagnosis, and plan of care documented in the resident's note.

## 2023-12-12 NOTE — ED Provider Notes (Signed)
 [place] EMERGENCY DEPARTMENT AT [facility] Provider Note   CSN: [id number] Arrival date & time: 12/12/23  1044     History  Chief Complaint  Patient presents with   Rectal Bleeding    Sonya Garner is a 43 y.o. female with PMH as listed below who presents with rectal bleeding. Per chart review yesterday was diagnosed with acute DVT in the left lower extremity and started on Eliquis . Had neg CT PE as well yesterday.  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 She endorses mild central abdominal pain, nonradiating. Had three episodes of BRBPR earlier, first two were mixed in with stool, then the third was just blood but small volume. No h/o similar.  Never had colonoscopy. No fam hx that she knows of IBD. No f/c,  urinary or vaginal sxs. Endorses nausea when she is having a bowel movement but no vomiting and no nausea now. Not chronically constipated, no straining to have BM. Had hemorrhoid years ago with pregnancy but none since then. No bleeding from elsewhere, no easy bruising noted.   Past Medical History:  Diagnosis Date   Chronic kidney disease    kidney stone   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   Qualifier: Diagnosis of   By: WATT, JOANNE       Mild concussion 12/05/2016       Home Medications Prior to Admission medications   Medication Sig Start Date End Date Taking? Authorizing Provider  APIXABAN  (ELIQUIS ) VTE STARTER PACK (10MG  AND 5MG ) Take as directed on package: start with two-5mg  tablets twice daily for 7 days. On day 8, switch to one-5mg  tablet twice daily. 12/11/23  Yes Robinson, John K, PA-C      Allergies    Feraheme [ferumoxytol ]    Review of Systems   Review of Systems A 10 point review of systems was performed and is negative unless otherwise reported in HPI.  Physical Exam Updated Vital Signs BP 116/87 (BP Location: Left Arm)   Pulse  63   Temp 97.9 F (36.6 C) (Oral)   Resp 16   Ht 5\' 4"  (1.626 m)   Wt 78.5 kg   SpO2 100%   BMI 29.70 kg/m  Physical Exam General: Normal appearing female, lying in bed.  HEENT: PERRLA, Sclera anicteric, MMM, trachea midline.  Cardiology: RRR, no murmurs/rubs/gallops.  Resp: Normal respiratory rate and effort. CTAB, no wheezes, rhonchi, crackles.  Abd: Soft, Mild tenderness in central abdomen, non-distended. No rebound tenderness or guarding.  Rectal: Performed with nurse chaperone.  Normal-appearing external anal sphincter.  No masses, internal hemorrhoids, or rectal foreign bodies palpated on digital rectal exam.  No brown stool or gross blood on glove. MSK: No peripheral edema or signs of trauma. Extremities without deformity or TTP.  Skin: warm, dry. Back: No CVA tenderness Neuro: A&Ox4, CNs II-XII grossly intact. MAEs. Sensation grossly intact.  Psych: Normal mood and affect.   ED Results / Procedures / Treatments   Labs (all labs ordered are listed, but only abnormal results are displayed) Labs Reviewed  COMPREHENSIVE METABOLIC PANEL WITH GFR - Abnormal; Notable for the following components:      Result Value   Glucose, Bld 111 (*)    All other components within normal limits  POC OCCULT BLOOD, ED - Abnormal; Notable for the following components:   Fecal Occult Bld POSITIVE (*)    All other components within normal limits  CBC  HCG, SERUM, QUALITATIVE  TYPE AND SCREEN    EKG None  Radiology CT Angio Chest Pulmonary Embolism (PE) W or WO Contrast Result Date: 12/11/2023 CLINICAL DATA:  Left leg pain x2 weeks. EXAM: CT ANGIOGRAPHY CHEST WITH CONTRAST TECHNIQUE: Multidetector CT imaging of the chest was performed using the standard protocol during bolus administration of intravenous contrast. Multiplanar CT image reconstructions and MIPs were obtained to evaluate the vascular anatomy. RADIATION DOSE REDUCTION: This exam was performed according to the departmental  dose-optimization program which includes automated exposure control, adjustment of the mA and/or kV according to patient size and/or use of iterative reconstruction technique. CONTRAST:  75mL OMNIPAQUE  IOHEXOL  350 MG/ML SOLN COMPARISON:  April 23, 2023 FINDINGS: Cardiovascular: The thoracic aorta is unremarkable. Satisfactory opacification of the pulmonary arteries to the segmental level. No evidence of pulmonary embolism. Normal heart size. No pericardial effusion. Mediastinum/Nodes: No enlarged mediastinal, hilar, or axillary lymph nodes. Thyroid gland, trachea, and esophagus demonstrate no significant findings. Lungs/Pleura: Very mild anteromedial right basilar scarring and/or atelectasis is noted. There is no evidence of acute infiltrate, pleural effusion or pneumothorax. Upper Abdomen: No acute abnormality. Musculoskeletal: No chest wall abnormality. No acute or significant osseous findings. Review of the MIP images confirms the above findings. IMPRESSION: 1. No evidence of pulmonary embolism or other acute intrathoracic process. Electronically Signed   By: Virgle Grime M.D.   On: 12/11/2023 19:14   VAS US  LOWER EXTREMITY VENOUS (DVT) (7a-7p) Result Date: 12/11/2023  Lower Venous DVT Study Patient Name:  Sonya Garner  Date of Exam:   12/11/2023 Medical Rec #: [id number]         Accession #:    [id number] Date of Birth: [birth year]         Patient Gender: F Patient Age:   37 years Exam [facility]  [facility] Procedure:      VAS US  LOWER EXTREMITY VENOUS (DVT) Referring Phys: Valentina Gasman --------------------------------------------------------------------------------  Indications: Pain.  Risk Factors: DVT. Comparison Study: 11/20/2022 - RIGHT:                   - No evidence of common femoral vein obstruction.                    LEFT:                   - Findings consistent with acute deep vein thrombosis                   involving the left                   popliteal vein, left gastrocnemius  veins, left posterior                   tibial veins, and                   left peroneal veins.                   - No cystic structure found in the popliteal fossa.                    04/23/2023 - RIGHT:                   - No evidence of common femoral vein obstruction.  LEFT:                    - There is recanalized thrombus in the left popliteal vein(s).                   There is recanalized thrombus in the left popliteal vein(s).                    - No cystic structure found in the popliteal fossa. Performing Technologist: Lerry Ransom RVT  Examination Guidelines: A complete evaluation includes B-mode imaging, spectral Doppler, color Doppler, and power Doppler as needed of all accessible portions of each vessel. Bilateral testing is considered an integral part of a complete examination. Limited examinations for reoccurring indications may be performed as noted. The reflux portion of the exam is performed with the patient in reverse Trendelenburg.  +-----+---------------+---------+-----------+----------+--------------+ RIGHTCompressibilityPhasicitySpontaneityPropertiesThrombus Aging +-----+---------------+---------+-----------+----------+--------------+ CFV  Full           Yes      Yes                                 +-----+---------------+---------+-----------+----------+--------------+   +---------+---------------+---------+-----------+----------+--------------+ LEFT     CompressibilityPhasicitySpontaneityPropertiesThrombus Aging +---------+---------------+---------+-----------+----------+--------------+ CFV      Full           Yes      Yes                                 +---------+---------------+---------+-----------+----------+--------------+ SFJ      Full                                                        +---------+---------------+---------+-----------+----------+--------------+ FV Prox  Full                                                         +---------+---------------+---------+-----------+----------+--------------+ FV Mid   Full                                                        +---------+---------------+---------+-----------+----------+--------------+ FV DistalFull                                                        +---------+---------------+---------+-----------+----------+--------------+ PFV      Full                                                        +---------+---------------+---------+-----------+----------+--------------+ POP      Partial        Yes  Yes                  Chronic        +---------+---------------+---------+-----------+----------+--------------+ PTV      Full                                                        +---------+---------------+---------+-----------+----------+--------------+ PERO     Full                                                        +---------+---------------+---------+-----------+----------+--------------+     Summary: RIGHT: - No evidence of common femoral vein obstruction.   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  - No cystic structure found in the popliteal fossa.  *See table(s) above for measurements and observations. Electronically signed by Genny Kid MD on 12/11/2023 at 6:05:55 PM.    Final    DG Chest Port 1 View Result Date: 12/11/2023 CLINICAL DATA:  Chest pain.  Shortness of breath. EXAM: PORTABLE CHEST 1 VIEW COMPARISON:  04/23/2023 FINDINGS: The cardiomediastinal contours are normal. Minimal hazy opacity at the right costophrenic angle. Pulmonary vasculature is normal. No pneumothorax. No significant pleural effusion. No acute osseous abnormalities are seen. IMPRESSION: Minimal hazy opacity at the right costophrenic angle, may represent atelectasis/airspace disease or overlapping soft tissues. Electronically Signed   By: Chadwick Colonel M.D.   On: 12/11/2023  17:40    Procedures Procedures    Medications Ordered in ED Medications - No data to display  ED Course/ Medical Decision Making/ A&P                          Medical Decision Making Amount and/or Complexity of Data Reviewed Labs: ordered. Decision-making details documented in ED Course.    This patient presents to the ED for concern of LGIB, this involves an extensive number of treatment options, and is a complaint that carries with it a high risk of complications and morbidity.  I considered the following differential and admission for this acute, potentially life threatening condition.   MDM:    DDx for LGIB includes but is not limited to: Hemorrhoids, anal fissure, rectal foreign body, diverticulitis/diverticulosis, ischemic colitis, IBD, AVMs or Dieulafoy lesions, infectious colitis, rmalignancy.   Based on clinical presentation, hematochezia reported and clinical exam history and findings, most concerned about LGIB. She has mild abdominal pain in the center but no signs of peritonitis. No f/c, nausea/vomiting, focal RLQ or RUQ TTP. Doubt hepatobiliary disease, LFTs normal as well. No leukocytosis or fever to indicate sepsis or severe life threatening intraabdominal infection. No hemorrhoids on exam or palpated internally. Also highly doubt ischemic colitis. Will need GI f/u with likely colonoscopy. She is HDS and normotensive, no tachycardia, Hgb stable, very low c/f acute GI hemorrhage. She did just restart eliquis  yesterday and I recommended that she continue it with known DVT, and if her bleeding continues or gets worse we can consider stopping it. Discussed specific return precautions/discharge instructions. She is given referral to [facility].     Clinical Course as of 12/12/23 2005  Thu Dec 12, 2023  1612 Hemoglobin: 12.3 Stable Hgb [HN]  1612 HDS [HN]  1740 Fecal Occult Blood, POC(!): POSITIVE Fecal occult positive. HDS, no gross blood on glove, no active bleeding in  ED. Has been in ED waiting room and room for nearly 7 hours with no recurrence of bleeding. She is well -appaering. Discussed specific return precautions/dischrage instructions. Her bleeding is not life threatening or severe, believe she should continue the eliquis  and follow up with GI in clinic. DC w/ discharge instructions/return precautions. All questions answered to patient's satisfaction.   [HN]    Clinical Course User Index [HN] Merdis Stalling, MD    Labs: I Ordered, and personally interpreted labs.  The pertinent results include:  those listed above  Additional history obtained from chart review, husband at bedside.   Reevaluation: I reevaluated the patient and found that they have :stayed the same  Social Determinants of Health: Lives independently  Disposition:  DC w/ discharge instructions/return precautions. All questions answered to patient's satisfaction.    Co morbidities that complicate the patient evaluation  Past Medical History:  Diagnosis Date   Chronic kidney disease    kidney stone   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   Qualifier: Diagnosis of   By: WATT, JOANNE       Mild concussion 12/05/2016     Medicines No orders of the defined types were placed in this encounter.   I have reviewed the patients home medicines and have made adjustments as needed  Problem List / ED Course: Problem List Items Addressed This Visit   None Visit Diagnoses       Rectal bleeding    -  Primary                   This note was created using dictation software, which may contain spelling or grammatical errors.    Merdis Stalling, MD 12/12/23 2020

## 2023-12-12 NOTE — ED Triage Notes (Signed)
 Patient was seen yesterday and dx with DVT in leg and started on blood thinner last night.  Reports started having bloody stools around 0400 this morning.

## 2023-12-13 ENCOUNTER — Telehealth: Payer: Self-pay

## 2023-12-13 NOTE — Telephone Encounter (Signed)
 I contacted pt to let her know that her FMLA and disability placard has been completed and ready for pick up. Pt will be here 12/13/2023. Both forms will be at the front desk.

## 2023-12-16 NOTE — Progress Notes (Signed)
 Chief Complaint: rectal bleeding Primary GI MD: Para Bold  HPI: Discussed the use of AI scribe software for clinical note transcription with the patient, who gave verbal consent to proceed.  History of Present Illness Sonya Garner is a 43 year old female with a history of DVTs and PEs who presents with rectal bleeding.  She was recently diagnosed with a deep vein thrombosis (DVT) in her leg on May 28th and was started on Eliquis . She has a history of DVTs and pulmonary embolisms (PEs) and had been off Eliquis  for approximately six and a half to seven months before this recent event.  Following the initiation of Eliquis , she experienced three episodes of rectal bleeding, prompting a visit to the emergency department. The bleeding was noticeable but not excessive, appearing both on tissue paper and in the toilet. Since the ED visit, she has not observed any further blood in her stool, and her bowel movements have returned to being solid. She did not undergo a colonoscopy at that time.  Her bowel habits are irregular, with occasional constipation, noting that she sometimes has to push during bowel movements. No significant weight loss, instead noting weight gain.  Her past medical history includes a PE and DVT in May of the previous year, for which she was treated with Eliquis . She was not on Depo-Provera  at the time of her previous clotting events. She is currently on Depo-Provera  to manage menstrual bleeding, which has been regular since undergoing a uterine fibroid embolization (UFE). No use of estrogen or birth control pills other than Depo-Provera . Her menstrual cycle was continuous starting in May of last year until the UFE procedure, after which it became regular.   Past Medical History:  Diagnosis Date   Chronic kidney disease    kidney stone   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   Qualifier: Diagnosis of   By: WATT, JOANNE       Mild concussion 12/05/2016    Past Surgical  History:  Procedure Laterality Date   IR ANGIOGRAM PELVIS SELECTIVE OR SUPRASELECTIVE  04/25/2023   IR ANGIOGRAM SELECTIVE EACH ADDITIONAL VESSEL  04/25/2023   IR ANGIOGRAM SELECTIVE EACH ADDITIONAL VESSEL  04/25/2023   IR ANGIOGRAM SELECTIVE EACH ADDITIONAL VESSEL  04/25/2023   IR EMBO ART  VEN HEMORR LYMPH EXTRAV  INC GUIDE ROADMAPPING  04/25/2023   IR RADIOLOGIST EVAL & MGMT  05/24/2023   IR US  GUIDE VASC ACCESS LEFT  04/25/2023   TUBAL LIGATION      Current Outpatient Medications  Medication Sig Dispense Refill   APIXABAN  (ELIQUIS ) VTE STARTER PACK (10MG  AND 5MG ) Take as directed on package: start with two-5mg  tablets twice daily for 7 days. On day 8, switch to one-5mg  tablet twice daily. 74 each 0   No current facility-administered medications for this visit.    Allergies as of 12/17/2023 - Review Complete 12/17/2023  Allergen Reaction Noted   Feraheme [ferumoxytol ] Shortness Of Breath 02/07/2023    Family History  Problem Relation Age of Onset   Breast cancer Maternal Grandmother    Cancer Maternal Grandmother    Breast cancer Maternal Aunt    Cancer Maternal Aunt        breast   Cancer Maternal Uncle    Hearing loss Neg Hx    Colon cancer Neg Hx    Esophageal cancer Neg Hx     Social History   Socioeconomic History   Marital status: Single    Spouse name: Not on file   Number of children:  Not on file   Years of education: Not on file   Highest education level: Not on file  Occupational History   Not on file  Tobacco Use   Smoking status: Former    Average packs/day: 0.3 packs/day for 10.0 years (2.5 ttl pk-yrs)    Types: Cigarettes    Start date: 11/14/2022   Smokeless tobacco: Never   Tobacco comments:    Stopped 11/19/2022   Vaping Use   Vaping status: Every Day   Substances: Nicotine  Substance and Sexual Activity   Alcohol use: No   Drug use: No   Sexual activity: Yes    Birth control/protection: Surgical    Comment: BTL  Other Topics Concern    Not on file  Social History Narrative   Not on file   Social Drivers of Health   Financial Resource Strain: Not on file  Food Insecurity: No Food Insecurity (04/24/2023)   Hunger Vital Sign    Worried About Running Out of Food in the Last Year: Never true    Ran Out of Food in the Last Year: Never true  Transportation Needs: No Transportation Needs (04/24/2023)   PRAPARE - Administrator, Civil Service (Medical): No    Lack of Transportation (Non-Medical): No  Physical Activity: Not on file  Stress: Not on file  Social Connections: Moderately Isolated (11/26/2022)   Social Connection and Isolation Panel [NHANES]    Frequency of Communication with Friends and Family: More than three times a week    Frequency of Social Gatherings with Friends and Family: More than three times a week    Attends Religious Services: Never    Database administrator or Organizations: No    Attends Banker Meetings: Never    Marital Status: Living with partner  Intimate Partner Violence: Not At Risk (04/24/2023)   Humiliation, Afraid, Rape, and Kick questionnaire    Fear of Current or Ex-Partner: No    Emotionally Abused: No    Physically Abused: No    Sexually Abused: No    Review of Systems:    Constitutional: No weight loss, fever, chills, weakness or fatigue HEENT: Eyes: No change in vision               Ears, Nose, Throat:  No change in hearing or congestion Skin: No rash or itching Cardiovascular: No chest pain, chest pressure or palpitations   Respiratory: No SOB or cough Gastrointestinal: See HPI and otherwise negative Genitourinary: No dysuria or change in urinary frequency Neurological: No headache, dizziness or syncope Musculoskeletal: No new muscle or joint pain Hematologic: No bleeding or bruising Psychiatric: No history of depression or anxiety    Physical Exam:  Vital signs: BP 112/84   Ht 5\' 4"  (1.626 m)   Wt 173 lb (78.5 kg)   BMI 29.70 kg/m    Constitutional: NAD, alert and cooperative Head:  Normocephalic and atraumatic. Eyes:   PEERL, EOMI. No icterus. Conjunctiva pink. Respiratory: Respirations even and unlabored. Lungs clear to auscultation bilaterally.   No wheezes, crackles, or rhonchi.  Cardiovascular:  Regular rate and rhythm. No peripheral edema, cyanosis or pallor.  Gastrointestinal:  Soft, nondistended, nontender. No rebound or guarding. Normal bowel sounds. No appreciable masses or hepatomegaly. Rectal:  Declines Msk:  Symmetrical without gross deformities. Without edema, no deformity or joint abnormality.  Neurologic:  Alert and  oriented x4;  grossly normal neurologically.  Skin:   Dry and intact without significant lesions or rashes.  Psychiatric: Oriented to person, place and time. Demonstrates good judgement and reason without abnormal affect or behaviors.   RELEVANT LABS AND IMAGING: CBC    Component Value Date/Time   WBC 5.0 12/12/2023 1055   RBC 4.50 12/12/2023 1055   HGB 12.3 12/12/2023 1055   HGB 12.6 05/30/2023 1043   HCT 36.9 12/12/2023 1055   HCT 40.0 05/30/2023 1043   PLT 273 12/12/2023 1055   PLT 182 05/30/2023 1043   MCV 82.0 12/12/2023 1055   MCV 88 05/30/2023 1043   MCH 27.3 12/12/2023 1055   MCHC 33.3 12/12/2023 1055   RDW 15.5 12/12/2023 1055   RDW 17.7 (H) 05/30/2023 1043   LYMPHSABS 1.3 02/25/2023 1032   MONOABS 1.4 (H) 11/20/2022 0820   EOSABS 0.1 02/25/2023 1032   BASOSABS 0.0 02/25/2023 1032    CMP     Component Value Date/Time   NA 139 12/12/2023 1055   K 3.6 12/12/2023 1055   CL 107 12/12/2023 1055   CO2 25 12/12/2023 1055   GLUCOSE 111 (H) 12/12/2023 1055   BUN 9 12/12/2023 1055   CREATININE 0.84 12/12/2023 1055   CALCIUM 9.3 12/12/2023 1055   PROT 7.1 12/12/2023 1055   ALBUMIN 3.9 12/12/2023 1055   AST 16 12/12/2023 1055   ALT 13 12/12/2023 1055   ALKPHOS 42 12/12/2023 1055   BILITOT 0.4 12/12/2023 1055   GFRNONAA >60 12/12/2023 1055     Assessment/Plan:    Rectal Bleeding Seen in ED 5/29 after 3 episodes BRBPR. Occult positive. No anemia. No previous colonoscopy. Recent DVT 12/11/23 put on Eliquis  (previous history of DVT and bilateral PE). Rectal bleeding could've been hemorrhoids with her history of straining. With recent DVT now started on anticoagulation she would be high risk for endoscopic evaluation with need to stop Eliquis . 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  Has not seen hematology. 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  Depo-Provera  not recommended in patient with hypercoagulable tendencies. Follow with Gyn to discuss.  Intermittent constipation with straining Overall solid soft formed stools but she does have occasional straining likely leading to hemorrhoids - Increase fiber, can add in Benefiber/Citrucel - Squatty potty - Increase water and increase exercise  Assigned to Dr. General Kenner today (Tuesday)  Lemoyne Scarpati [facility] 12/17/2023, 10:16 AM  Cc: Jonelle Neri, DO

## 2023-12-17 ENCOUNTER — Ambulatory Visit (INDEPENDENT_AMBULATORY_CARE_PROVIDER_SITE_OTHER): Admitting: Gastroenterology

## 2023-12-17 ENCOUNTER — Encounter: Payer: Self-pay | Admitting: Gastroenterology

## 2023-12-17 VITALS — BP 112/84 | Ht 64.0 in | Wt 173.0 lb

## 2023-12-17 DIAGNOSIS — I2699 Other pulmonary embolism without acute cor pulmonale: Secondary | ICD-10-CM

## 2023-12-17 DIAGNOSIS — I82409 Acute embolism and thrombosis of unspecified deep veins of unspecified lower extremity: Secondary | ICD-10-CM

## 2023-12-17 DIAGNOSIS — K5900 Constipation, unspecified: Secondary | ICD-10-CM | POA: Diagnosis not present

## 2023-12-17 DIAGNOSIS — N939 Abnormal uterine and vaginal bleeding, unspecified: Secondary | ICD-10-CM | POA: Diagnosis not present

## 2023-12-17 DIAGNOSIS — K625 Hemorrhage of anus and rectum: Secondary | ICD-10-CM

## 2023-12-17 DIAGNOSIS — Z7901 Long term (current) use of anticoagulants: Secondary | ICD-10-CM

## 2023-12-17 DIAGNOSIS — Z86711 Personal history of pulmonary embolism: Secondary | ICD-10-CM

## 2023-12-17 DIAGNOSIS — I824Y9 Acute embolism and thrombosis of unspecified deep veins of unspecified proximal lower extremity: Secondary | ICD-10-CM

## 2023-12-17 NOTE — Progress Notes (Signed)
 Agree with assessment with the following thoughts:  Patient does warrant a colonoscopy, question is timing and when she would be able to hold anticoagulation.   [facility] may be best to reach out directly to her hematologist to determine if / when she will be able to hold anticoagulation. If she is not going to be able to hold it anytime soon and her bleeding symptoms persist or should she develop IDA, could do a diagnostic exam on anticoagulation just to ensure no high risk polyp / lesion in her colon.   Can you keep me posted on timeline per hematology? If okay to hold it in 3 months or so could do it at that time. Will only need a 2 day hold for an exam

## 2023-12-17 NOTE — Patient Instructions (Signed)
 We placed a referral to to hematology, their office will call you to schedule an appointment.   _______________________________________________________  If your blood pressure at your visit was 140/90 or greater, please contact your primary care physician to follow up on this.  _______________________________________________________  If you are age 41 or older, your body mass index should be between 23-30. Your Body mass index is 29.7 kg/m. If this is out of the aforementioned range listed, please consider follow up with your Primary Care Provider.  If you are age 93 or younger, your body mass index should be between 19-25. Your Body mass index is 29.7 kg/m. If this is out of the aformentioned range listed, please consider follow up with your Primary Care Provider.   ________________________________________________________  The [facility] providers would like to encourage you to use MYCHART to communicate with providers for non-urgent requests or questions.  Due to long hold times on the telephone, sending your provider a message by [facility] may be a faster and more efficient way to get a response.  Please allow 48 business hours for a response.  Please remember that this is for non-urgent requests.  _______________________________________________________

## 2024-01-08 ENCOUNTER — Ambulatory Visit: Admitting: Student

## 2024-01-08 VITALS — BP 118/79 | HR 103 | Temp 98.6°F | Ht 64.0 in | Wt 172.0 lb

## 2024-01-08 DIAGNOSIS — F17290 Nicotine dependence, other tobacco product, uncomplicated: Secondary | ICD-10-CM

## 2024-01-08 DIAGNOSIS — I824Z2 Acute embolism and thrombosis of unspecified deep veins of left distal lower extremity: Secondary | ICD-10-CM | POA: Diagnosis not present

## 2024-01-08 DIAGNOSIS — N939 Abnormal uterine and vaginal bleeding, unspecified: Secondary | ICD-10-CM

## 2024-01-08 DIAGNOSIS — Z0000 Encounter for general adult medical examination without abnormal findings: Secondary | ICD-10-CM

## 2024-01-08 DIAGNOSIS — D509 Iron deficiency anemia, unspecified: Secondary | ICD-10-CM

## 2024-01-08 DIAGNOSIS — F17200 Nicotine dependence, unspecified, uncomplicated: Secondary | ICD-10-CM

## 2024-01-08 DIAGNOSIS — K625 Hemorrhage of anus and rectum: Secondary | ICD-10-CM

## 2024-01-08 DIAGNOSIS — Z1159 Encounter for screening for other viral diseases: Secondary | ICD-10-CM

## 2024-01-08 MED ORDER — APIXABAN 5 MG PO TABS: 5.0000 mg | ORAL_TABLET | Freq: Two times a day (BID) | ORAL | 3 refills | Status: DC

## 2024-01-08 NOTE — Patient Instructions (Signed)
 Thank you, Ms.Shaima Sardinas Feltz for allowing us  to provide your care today. Today we discussed:  -Continue Eliquis  twice a day -Glad the blood in stool has resolved. -Continue follow up with GI, hematology and OBGYN -Continue Depo injection either here or GYN -Blood work today to check blood counts and iron  levels  I have ordered the following medication/changed the following medications:  Start the following medications: Meds ordered this encounter  Medications   apixaban  (ELIQUIS ) 5 MG TABS tablet    Sig: Take 1 tablet (5 mg total) by mouth 2 (two) times daily.    Dispense:  180 tablet    Refill:  3     Follow up: 3 months   Should you have any questions or concerns please call the internal medicine clinic at [phone].    Norbert Malkin, D.O. [facility]

## 2024-01-08 NOTE — Assessment & Plan Note (Signed)
 Vaping daily still. Encouraged cessation.

## 2024-01-08 NOTE — Progress Notes (Unsigned)
 CC: Left Leg Pain f/u  HPI:  Sonya Garner is a 43 y.o. female living with a history stated below and presents today for left leg pain f/u. Please see problem based assessment and plan for additional details.  Past Medical History:  Diagnosis Date   Chronic kidney disease    kidney stone   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   Qualifier: Diagnosis of   By: WATT, JOANNE       Mild concussion 12/05/2016    Current Outpatient Medications on File Prior to Visit  Medication Sig Dispense Refill   APIXABAN  (ELIQUIS ) VTE STARTER PACK (10MG  AND 5MG ) Take as directed on package: start with two-5mg  tablets twice daily for 7 days. On day 8, switch to one-5mg  tablet twice daily. 74 each 0   No current facility-administered medications on file prior to visit.    Family History  Problem Relation Age of Onset   Breast cancer Maternal Grandmother    Cancer Maternal Grandmother    Breast cancer Maternal Aunt    Cancer Maternal Aunt        breast   Cancer Maternal Uncle    Hearing loss Neg Hx    Colon cancer Neg Hx    Esophageal cancer Neg Hx     Social History   Socioeconomic History   Marital status: Single    Spouse name: Not on file   Number of children: Not on file   Years of education: Not on file   Highest education level: Not on file  Occupational History   Not on file  Tobacco Use   Smoking status: Former    Average packs/day: 0.3 packs/day for 10.0 years (2.5 ttl pk-yrs)    Types: Cigarettes    Start date: 11/14/2022   Smokeless tobacco: Never   Tobacco comments:    Stopped 11/19/2022   Vaping Use   Vaping status: Every Day   Substances: Nicotine  Substance and Sexual Activity   Alcohol use: No   Drug use: No   Sexual activity: Yes    Birth control/protection: Surgical    Comment: BTL  Other Topics Concern   Not on file  Social History Narrative   Not on file   Social Drivers of Health   Financial Resource Strain: Not on file  Food Insecurity: No Food  Insecurity (04/24/2023)   Hunger Vital Sign    Worried About Running Out of Food in the Last Year: Never true    Ran Out of Food in the Last Year: Never true  Transportation Needs: No Transportation Needs (04/24/2023)   PRAPARE - Administrator, Civil Service (Medical): No    Lack of Transportation (Non-Medical): No  Physical Activity: Not on file  Stress: Not on file  Social Connections: Moderately Isolated (11/26/2022)   Social Connection and Isolation Panel    Frequency of Communication with Friends and Family: More than three times a week    Frequency of Social Gatherings with Friends and Family: More than three times a week    Attends Religious Services: Never    Database administrator or Organizations: No    Attends Banker Meetings: Never    Marital Status: Living with partner  Intimate Partner Violence: Not At Risk (04/24/2023)   Humiliation, Afraid, Rape, and Kick questionnaire    Fear of Current or Ex-Partner: No    Emotionally Abused: No    Physically Abused: No    Sexually Abused: No    Review  of Systems: ROS negative except for what is noted on the assessment and plan.  Vitals:   01/08/24 1409  BP: 118/79  Pulse: (!) 103  Temp: 98.6 F (37 C)  TempSrc: Oral  SpO2: 98%  Weight: 172 lb (78 kg)  Height: 5' 4 (1.626 m)   Physical Exam: Constitutional: well-appearing female sitting in chair comfortably, in no acute distress Cardiovascular: regular rhythm, mild tachycardia Pulmonary/Chest: normal work of breathing on room air, lungs clear to auscultation bilaterally, able to speak in full sentences MSK: mild LLE swelling but no tenderness, pain or erythema Neurological: alert & oriented x 3  Assessment & Plan:   Tobacco use Vaping daily still. Encouraged cessation which patient verbalizes understanding. Holding off Chantix and NRT in setting of recurrent VTE.    Time spent for smoking/tobacco cessation counseling: 4 minutes   Abnormal  uterine bleeding Reports less heavy bleeding with cycles. Menstrual cycle monthly and lasts about 7 days with 4-5 pads on heavy days. S/p Depo shot and uterine artery embolization. Overdue for f/u with OBGYN. Advised patient to schedule f/u appt soon.   DVT, lower extremity, distal, acute, left (HCC) F/u 1 month. Had left leg pain, tenderness and swelling. DVT US  done in ED with chronic DVT noted. Negative for acute PE. Restarted on Eliquis  which was stopped in setting of AUB and worsening anemia. Shortly after developed BRBPR and second ED visit and subsequently GI f/u. No further blood in stools today which is reassuring. Plan for scoping per GI after hematology f/u regarding her anticoagulation. She has f/u with hematology on 7/3. LLE symptoms improved with no pain or TTP. Mild swelling but no warmth. No SOB at rest but some with climbing stairs but improved from past.   Plan -Continue Eliquis  5 mg BID -Monitor for DVT symptoms and bleeding  -F/u with hematology   Iron  deficiency anemia Hx of IDA in setting of AUB. S/p Depo shot and COLOMBIA. Needs f/u with GYN soon. Last CBC appear stable. No acute worsening of vaginal bleeding. Back on Eliquis .   Plan -Repeat CBC and iron  studies -Restart oral iron  supplementation if needed (every other day)   BRBPR (bright red blood per rectum) See above problem regarding this. Followed with GI with plans for colonoscopy, awaiting f/u with hematology. Denies any current signs/symptoms of GI bleed.   Healthcare maintenance -Agreed to HCV screening today. Denies concerns for hepatitis.  -Declined Tdap today.    Patient discussed with Dr. Francesco Ozell Nearing, D.O. [facility], PGY-2 Phone: [phone] Date 01/08/2024 Time 8:27 AM

## 2024-01-09 ENCOUNTER — Other Ambulatory Visit: Payer: Self-pay | Admitting: Student

## 2024-01-09 ENCOUNTER — Encounter: Payer: Self-pay | Admitting: Student

## 2024-01-09 ENCOUNTER — Ambulatory Visit: Payer: Self-pay | Admitting: Student

## 2024-01-09 DIAGNOSIS — Z0000 Encounter for general adult medical examination without abnormal findings: Secondary | ICD-10-CM | POA: Insufficient documentation

## 2024-01-09 DIAGNOSIS — K625 Hemorrhage of anus and rectum: Secondary | ICD-10-CM | POA: Insufficient documentation

## 2024-01-09 LAB — CBC
Hematocrit: 39.3 % (ref 34.0–46.6)
Hemoglobin: 12.6 g/dL (ref 11.1–15.9)
MCH: 26.9 pg (ref 26.6–33.0)
MCHC: 32.1 g/dL (ref 31.5–35.7)
MCV: 84 fL (ref 79–97)
Platelets: 217 10*3/uL (ref 150–450)
RBC: 4.69 x10E6/uL (ref 3.77–5.28)
RDW: 15.3 % (ref 11.7–15.4)
WBC: 4.3 10*3/uL (ref 3.4–10.8)

## 2024-01-09 LAB — IRON AND TIBC
Iron Saturation: 20 % (ref 15–55)
Iron: 92 ug/dL (ref 27–159)
Total Iron Binding Capacity: 462 ug/dL — ABNORMAL HIGH (ref 250–450)
UIBC: 370 ug/dL (ref 131–425)

## 2024-01-09 LAB — HCV INTERPRETATION

## 2024-01-09 LAB — FERRITIN: Ferritin: 17 ng/mL (ref 15–150)

## 2024-01-09 LAB — HCV AB W REFLEX TO QUANT PCR: HCV Ab: NONREACTIVE

## 2024-01-09 MED ORDER — FERROUS SULFATE 325 (65 FE) MG PO TABS: 325.0000 mg | ORAL_TABLET | Freq: Every day | ORAL | Status: DC

## 2024-01-09 MED ORDER — FERROUS SULFATE 325 (65 FE) MG PO TABS: 325.0000 mg | ORAL_TABLET | ORAL | Status: AC

## 2024-01-09 NOTE — Assessment & Plan Note (Signed)
 Hx of IDA in setting of AUB. S/p Depo shot and COLOMBIA. Needs f/u with GYN soon. Last CBC appear stable. No acute worsening of vaginal bleeding. Back on Eliquis .   Plan -Repeat CBC and iron  studies -Restart oral iron  supplementation if needed (every other day)

## 2024-01-09 NOTE — Assessment & Plan Note (Signed)
 See above problem regarding this. Followed with GI with plans for colonoscopy, awaiting f/u with hematology. Denies any current signs/symptoms of GI bleed.

## 2024-01-09 NOTE — Addendum Note (Signed)
 Addended by: ELICIA SHARPER on: 01/09/2024 04:00 PM   Modules accepted: Orders

## 2024-01-09 NOTE — Assessment & Plan Note (Signed)
-  Agreed to HCV screening today. Denies concerns for hepatitis.  -Declined Tdap today.

## 2024-01-09 NOTE — Assessment & Plan Note (Addendum)
 F/u 1 month. Had left leg pain, tenderness and swelling. DVT US  done in ED with chronic DVT noted. Negative for acute PE. Restarted on Eliquis  which was stopped in setting of AUB and worsening anemia. Shortly after developed BRBPR and second ED visit and subsequently GI f/u. No further blood in stools today which is reassuring. Plan for scoping per GI after hematology f/u regarding her anticoagulation. She has f/u with hematology on 7/3. LLE symptoms improved with no pain or TTP. Mild swelling but no warmth. No SOB at rest but some with climbing stairs but improved from past.   Plan -Continue Eliquis  5 mg BID -Monitor for DVT symptoms and bleeding  -F/u with hematology

## 2024-01-09 NOTE — Assessment & Plan Note (Addendum)
 Reports less heavy bleeding with cycles. Menstrual cycle monthly and lasts about 7 days with 4-5 pads on heavy days. S/p Depo shot and uterine artery embolization. Overdue for f/u with OBGYN. Advised patient to schedule f/u appt soon.

## 2024-01-09 NOTE — Progress Notes (Signed)
 Opened in error

## 2024-01-09 NOTE — Addendum Note (Signed)
 Addended by: ELICIA SHARPER on: 01/09/2024 03:59 PM   Modules accepted: Orders

## 2024-01-10 NOTE — Progress Notes (Signed)
 Internal Medicine Clinic Attending  Case discussed with the resident at the time of the visit.  We reviewed the resident's history and exam and pertinent patient test results.  I agree with the assessment, diagnosis, and plan of care documented in the resident's note.

## 2024-01-16 ENCOUNTER — Inpatient Hospital Stay

## 2024-01-16 ENCOUNTER — Inpatient Hospital Stay: Attending: Nurse Practitioner | Admitting: Nurse Practitioner

## 2024-01-16 VITALS — BP 112/78 | HR 73 | Temp 98.4°F | Resp 17 | Ht 64.0 in | Wt 175.6 lb

## 2024-01-16 DIAGNOSIS — Z87891 Personal history of nicotine dependence: Secondary | ICD-10-CM | POA: Insufficient documentation

## 2024-01-16 DIAGNOSIS — I82532 Chronic embolism and thrombosis of left popliteal vein: Secondary | ICD-10-CM | POA: Diagnosis not present

## 2024-01-16 DIAGNOSIS — Z803 Family history of malignant neoplasm of breast: Secondary | ICD-10-CM | POA: Diagnosis not present

## 2024-01-16 DIAGNOSIS — I824Z2 Acute embolism and thrombosis of unspecified deep veins of left distal lower extremity: Secondary | ICD-10-CM | POA: Diagnosis not present

## 2024-01-16 DIAGNOSIS — K625 Hemorrhage of anus and rectum: Secondary | ICD-10-CM | POA: Insufficient documentation

## 2024-01-16 DIAGNOSIS — Z7901 Long term (current) use of anticoagulants: Secondary | ICD-10-CM | POA: Insufficient documentation

## 2024-01-16 DIAGNOSIS — Z8639 Personal history of other endocrine, nutritional and metabolic disease: Secondary | ICD-10-CM | POA: Insufficient documentation

## 2024-01-16 NOTE — Assessment & Plan Note (Signed)
 Initial diagnosis of bilateral pulmonary emboli on 11/20/2022.  The largest thrombosis was on the left extending into the upper and lower lobe branches of the lungs.  Blood clots considered to be unprovoked. She was seen in the emergency department on that date with shortness of breath.  She was started on Eliquis  5 mg twice daily upon discharge from the hospital on 11/21/2022.  In addition to bilateral pulmonary emboli, she had an acute DVT in her left lower extremity, and significant symptomatic anemia.  On arrival to the ED, her hemoglobin was 5.2.  She had low MCV of 57 and low ferritin, low iron , and elevated TIBC.  Her platelet count was normal and safe smear is unremarkable.  Her B12 and folate were normal.  Hemolysis labs were negative.  She received 2 units of RBCs and IV iron  of 500 mg. Eliquis  had to be stopped in October 2024 due to significant abnormal uterine bleeding.  She did have embolization of uterine fibroids.  Menstrual bleeding have become more normal after fibroid embolization. In May, 2025, she developed left lower extremity pain.  She also developed some shortness of breath.  She was sent to the ED for evaluation of DVT and possible PE.  CT angio chest was negative for evidence of pulmonary embolism. LE doppler was positive for left DVT in left popliteal vein. She was restarted on eliquis .  She was also given injection of depo-provera  to help manage abnormal uterine bleeding. The day after restarting on eliquis , she returned to the ED due to blood in stool. No changes were made to the medications. She has seen GI and her primary care since then. GI plans to proceed with colonoscopy after care established with hematology.  Her most recent CBC was drawn 01/08/2024.  Her Hgb was 12.6, HCT 39.3, MCV 84,  and platelets were 217.  TIBC was 462, iron  92, and iron  sat was 20%.  Her ferritin was 17. It does not appear that she has had workup for coagulopathy. Advised patient she would likely be on  anticoagulants for the rest of her life pending no problems with bleeding occur.  Both episodes of clot formation have been unprovoked.  With uterine bleeding now controlled, plan to stay on Eliquis  or other anticoagulant.  Due to episode of rectal bleeding right after starting back on Eliquis , GI would like to perform colonoscopy which is reasonable.  Eliquis  restarted on 12/12/2023.  We would like to have her back on Eliquis  for at least 3 months before holding this medication for procedure.  Ultimately, we would like to have her back on it for 6 months if GI is agreeable to this.  I did reach out to them to see their preference.  Will arrange for her to remain off blood thinner following colonoscopy for additional 2 to 3 weeks to have coagulopathy workup performed.

## 2024-01-16 NOTE — Progress Notes (Addendum)
 [facility]  [phone]   HEMATOLOGY ONCOLOGY CONSULTATION   Sonya Garner  DOB: [birth year]  MR#: [id number]  CSN#: [id number]    Patient Care Team: Tobie Gaines, DO as PCP - General (Internal Medicine) Elicia Sharper, DO as PCP - Internal Medicine Armbruster, Elspeth SQUIBB, MD as Consulting Physician (Gastroenterology) Sonya Garner (Gastroenterology) Lanny Callander, MD as Consulting Physician (Hematology)  Reason for consult: Chronic DVT  History of present illness:   Initial diagnosis of bilateral pulmonary emboli on 11/20/2022.  The largest thrombosis was on the left extending into the upper and lower lobe branches of the lungs.  Blood clots considered to be unprovoked. She was seen in the emergency department on that date with shortness of breath.  She was started on Eliquis  5 mg twice daily upon discharge from the hospital on 11/21/2022.  In addition to bilateral pulmonary emboli, she had an acute DVT in her left lower extremity, and significant symptomatic anemia.  On arrival to the ED, her hemoglobin was 5.2.  She had low MCV of 57 and low ferritin, low iron , and elevated TIBC.  Her platelet count was normal and safe smear is unremarkable.  Her B12 and folate were normal.  Hemolysis labs were negative.  She received 2 units of RBCs and IV iron  of 500 mg. Eliquis  had to be stopped in October 2024 due to significant abnormal uterine bleeding.  She did have embolization of uterine fibroids.  Menstrual bleeding have become more normal after fibroid embolization. - In May, 2025, she developed left lower extremity pain.  She also developed some shortness of breath.  She was sent to the ED for evaluation of DVT and possible PE.  CT angio chest was negative for evidence of pulmonary embolism. LE doppler was positive for left DVT in left popliteal vein. She was restarted on eliquis .  She was also given injection of depo-provera  to help manage abnormal uterine bleeding.  The day after restarting on eliquis , she returned to the ED due to blood in stool. No changes were made to the medications. She has seen GI and her primary care since then. GI plans to proceed with colonoscopy after care established with hematology.  Her most recent CBC was drawn 01/08/2024.  Her Hgb was 12.6, HCT 39.3, MCV 84,  and platelets were 217.  TIBC was 462, iron  92, and iron  sat was 20%.  Her ferritin was 17. It does not appear that she has had workup for coagulopathy. Today, the patient has persistent left lower leg swelling and some tenderness.  States it is improving with time. She denies fevers, chills, or night sweats.  She denies unintentional weight loss.  She has had some generalized joint and muscle pain which started approximately 1 month ago.  She states this is most uncomfortable first thing in the morning.  States she has to get up and get moving and then she feels much better. She denies chest pain, chest pressure, or shortness of breath. She denies headaches or visual disturbances. She denies abdominal pain, nausea, vomiting, or changes in bowel or bladder habits.  She reports no further episodes of rectal bleeding.  She has had no episodes of epistaxis, hematemesis, or hemoptysis.  She states menstrual periods have been normal since she had fibroid embolization procedure.  Socially, the patient has a significant other with whom she lives.  She has 4 children.  They are aged 41, 39, 69, and 65.  She also has a 19-year-old  granddaughter.  She works full-time.  She is a Engineer, water at [employer].  She currently does not smoke.  States she stopped smoking in May 2024.  She states she quit smoking until February 2025, at which time she started using a vape pen.  She denies alcohol or drug use.  She reports no family history of DVT or PE.  She does have some family history of cancer.  She states several men in her family have been diagnosed with prostate cancer.  She  states her maternal aunt and grandmother both passed away from breast cancer.   MEDICAL HISTORY:  Past Medical History:  Diagnosis Date   Chronic kidney disease    kidney stone   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   Qualifier: Diagnosis of   By: WATT, JOANNE       Mild concussion 12/05/2016    SURGICAL HISTORY: Past Surgical History:  Procedure Laterality Date   IR ANGIOGRAM PELVIS SELECTIVE OR SUPRASELECTIVE  04/25/2023   IR ANGIOGRAM SELECTIVE EACH ADDITIONAL VESSEL  04/25/2023   IR ANGIOGRAM SELECTIVE EACH ADDITIONAL VESSEL  04/25/2023   IR ANGIOGRAM SELECTIVE EACH ADDITIONAL VESSEL  04/25/2023   IR EMBO ART  VEN HEMORR LYMPH EXTRAV  INC GUIDE ROADMAPPING  04/25/2023   IR RADIOLOGIST EVAL & MGMT  05/24/2023   IR US  GUIDE VASC ACCESS LEFT  04/25/2023   TUBAL LIGATION      SOCIAL HISTORY: Social History   Socioeconomic History   Marital status: Single    Spouse name: Not on file   Number of children: Not on file   Years of education: Not on file   Highest education level: Not on file  Occupational History   Not on file  Tobacco Use   Smoking status: Former    Average packs/day: 0.3 packs/day for 10.0 years (2.5 ttl pk-yrs)    Types: Cigarettes    Start date: 11/14/2022   Smokeless tobacco: Never   Tobacco comments:    Stopped 11/19/2022   Vaping Use   Vaping status: Every Day   Substances: Nicotine  Substance and Sexual Activity   Alcohol use: No   Drug use: No   Sexual activity: Yes    Birth control/protection: Surgical    Comment: BTL  Other Topics Concern   Not on file  Social History Narrative   Not on file   Social Drivers of Health   Financial Resource Strain: Not on file  Food Insecurity: No Food Insecurity (01/16/2024)   Hunger Vital Sign    Worried About Running Out of Food in the Last Year: Never true    Ran Out of Food in the Last Year: Never true  Transportation Needs: No Transportation Needs (01/16/2024)   PRAPARE - Scientist, research (physical sciences) (Medical): No    Lack of Transportation (Non-Medical): No  Physical Activity: Not on file  Stress: Not on file  Social Connections: Moderately Isolated (11/26/2022)   Social Connection and Isolation Panel    Frequency of Communication with Friends and Family: More than three times a week    Frequency of Social Gatherings with Friends and Family: More than three times a week    Attends Religious Services: Never    Database administrator or Organizations: No    Attends Banker Meetings: Never    Marital Status: Living with partner  Intimate Partner Violence: Not At Risk (01/16/2024)   Humiliation, Afraid, Rape, and Kick questionnaire    Fear of  Current or Ex-Partner: No    Emotionally Abused: No    Physically Abused: No    Sexually Abused: No    FAMILY HISTORY: Family History  Problem Relation Age of Onset   Breast cancer Maternal Grandmother    Cancer Maternal Grandmother    Breast cancer Maternal Aunt    Cancer Maternal Aunt        breast   Cancer Maternal Uncle    Hearing loss Neg Hx    Colon cancer Neg Hx    Esophageal cancer Neg Hx     ALLERGIES:  is allergic to feraheme [ferumoxytol ].  MEDICATIONS:  Current Outpatient Medications  Medication Sig Dispense Refill   apixaban  (ELIQUIS ) 5 MG TABS tablet Take 1 tablet (5 mg total) by mouth 2 (two) times daily. 180 tablet 3   APIXABAN  (ELIQUIS ) VTE STARTER PACK (10MG  AND 5MG ) Take as directed on package: start with two-5mg  tablets twice daily for 7 days. On day 8, switch to one-5mg  tablet twice daily. 74 each 0   ferrous sulfate  325 (65 FE) MG tablet Take 1 tablet (325 mg total) by mouth every other day.     No current facility-administered medications for this visit.    REVIEW OF SYSTEMS:   Constitutional: Denies fevers, chills or abnormal night sweats.  Mild fatigue. Eyes: Denies blurriness of vision, double vision or watery eyes Ears, nose, mouth, throat, and face: Denies mucositis or sore  throat Respiratory: Denies cough, dyspnea or wheezes Cardiovascular: Denies palpitation or chest discomfort.  She states she did have pretty significant left lower extremity swelling which is gradually improving with time. Gastrointestinal:  Denies nausea, heartburn or change in bowel habits.  Had single episode of rectal bleeding on 12/12/2023. Skin: Denies abnormal skin rashes Lymphatics: Denies new lymphadenopathy or easy bruising Neurological:Denies numbness, tingling or new weaknesses Behavioral/Psych: Mood is stable, no new changes  All other systems were reviewed with the patient and are negative.  PHYSICAL EXAMINATION: ECOG PERFORMANCE STATUS: 1 - Symptomatic but completely ambulatory  Vitals:   01/16/24 1504  BP: 112/78  Pulse: 73  Resp: 17  Temp: 98.4 F (36.9 C)  SpO2: 100%   Filed Weights   01/16/24 1504  Weight: 175 lb 9.6 oz (79.7 kg)    GENERAL:alert, no distress and comfortable SKIN: skin color, texture, turgor are normal, no rashes or significant lesions EYES: normal, conjunctiva are pink and non-injected, sclera clear OROPHARYNX:no exudate, no erythema and lips, buccal mucosa, and tongue normal  NECK: supple, thyroid normal size, non-tender, without nodularity LYMPH:  no palpable lymphadenopathy in the cervical, axillary or inguinal LUNGS: clear to auscultation and percussion with normal breathing effort HEART: regular rate & rhythm and no murmurs.  There is very mild left lower extremity swelling without tenderness.  Left pedal pulse is palpable but slightly diminished when compared to the right.   ABDOMEN:abdomen soft, non-tender and normal bowel sounds Musculoskeletal:no cyanosis of digits and no clubbing  PSYCH: alert & oriented x 3 with fluent speech NEURO: no focal motor/sensory deficits  LABORATORY DATA:  I have reviewed the data as listed Lab Results  Component Value Date   WBC 4.3 01/08/2024   HGB 12.6 01/08/2024   HCT 39.3 01/08/2024   MCV 84  01/08/2024   PLT 217 01/08/2024   Recent Labs    04/23/23 0829 04/24/23 0434 04/26/23 0436 12/11/23 1703 12/12/23 1055  NA 136   < > 139 137 139  K 3.6   < > 3.6 3.6 3.6  CL 104   < > 109 105 107  CO2 19*   < > 22 25 25   GLUCOSE 118*   < > 104* 100* 111*  BUN 8   < > 8 9 9   CREATININE 0.80   < > 0.79 0.80 0.84  CALCIUM 8.4*   < > 8.6* 9.3 9.3  GFRNONAA >60   < > >60 >60 >60  PROT 6.1*  --   --   --  7.1  ALBUMIN 3.3*  --   --   --  3.9  AST 29  --   --   --  16  ALT 25  --   --   --  13  ALKPHOS 32*  --   --   --  42  BILITOT 0.7  --   --   --  0.4   < > = values in this interval not displayed.    ASSESSMENT & PLAN:  DVT, lower extremity, distal, acute, left [facility]) Assessment & Plan: Initial diagnosis of bilateral pulmonary emboli on 11/20/2022.  The largest thrombosis was on the left extending into the upper and lower lobe branches of the lungs.  Blood clots considered to be unprovoked. She was seen in the emergency department on that date with shortness of breath.  She was started on Eliquis  5 mg twice daily upon discharge from the hospital on 11/21/2022.  In addition to bilateral pulmonary emboli, she had an acute DVT in her left lower extremity, and significant symptomatic anemia.  On arrival to the ED, her hemoglobin was 5.2.  She had low MCV of 57 and low ferritin, low iron , and elevated TIBC.  Her platelet count was normal and safe smear is unremarkable.  Her B12 and folate were normal.  Hemolysis labs were negative.  She received 2 units of RBCs and IV iron  of 500 mg. Eliquis  had to be stopped in October 2024 due to significant abnormal uterine bleeding.  She did have embolization of uterine fibroids.  Menstrual bleeding have become more normal after fibroid embolization. In May, 2025, she developed left lower extremity pain.  She also developed some shortness of breath.  She was sent to the ED for evaluation of DVT and possible PE.  CT angio chest was negative for evidence of  pulmonary embolism. LE doppler was positive for left DVT in left popliteal vein. She was restarted on eliquis .  She was also given injection of depo-provera  to help manage abnormal uterine bleeding. The day after restarting on eliquis , she returned to the ED due to blood in stool. No changes were made to the medications. She has seen GI and her primary care since then. GI plans to proceed with colonoscopy after care established with hematology.  Her most recent CBC was drawn 01/08/2024.  Her Hgb was 12.6, HCT 39.3, MCV 84,  and platelets were 217.  TIBC was 462, iron  92, and iron  sat was 20%.  Her ferritin was 17. It does not appear that she has had workup for coagulopathy. Advised patient she would likely be on anticoagulants for the rest of her life pending no problems with bleeding occur.  Both episodes of clot formation have been unprovoked.  With uterine bleeding now controlled, plan to stay on Eliquis  or other anticoagulant.  Due to episode of rectal bleeding right after starting back on Eliquis , GI would like to perform colonoscopy which is reasonable.  Eliquis  restarted on 12/12/2023.  We would like to have her back on Eliquis   for at least 3 months before holding this medication for procedure.  Ultimately, we would like to have her back on it for 6 months if GI is agreeable to this.  I did reach out to them to see their preference.  Will arrange for her to remain off blood thinner following colonoscopy for additional 2 to 3 weeks to have coagulopathy workup performed.      The patient was seen along with Dr.Feng today. Time spent with the patient was approximately 30 minutes. This time included reviewing progress notes, labs, imaging studies, and discussing plan for follow up.  All questions were answered. The patient knows to call the clinic with any problems, questions or concerns.      Powell FORBES Lessen, NP 01/16/2024 5:40 PM  ADDENDUM I have seen the patient, examined her. I agree with the  assessment and and plan and have edited the notes.   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  Her first episode of thrombosis was unprovoked, treated with 6 months of Eliquis .  She presents to the emergency room in May 2025 with left low extremity pain, Doppler showed chronic popliteal vein thrombosis.  I recommend lifelong anticoagulation.  I also recommend hypercoagulopathy workup, probably after 3 to 6 months of anticoagulation when she holds Eliquis .  Her anemia has resolved after endometrial ablation.  She needs colonoscopy due to her recent episode of rectal bleeding, I recommend at least 3 months uninterrupted anticoagulation before colonoscopy.  Will communicate with her GI.  All questions were answered, I spent a total of 30 minutes for her visit today.  Will see her back after her hypercoagulopathy workup.  Onita Mattock MD 01/16/2024

## 2024-01-22 ENCOUNTER — Telehealth: Payer: Self-pay | Admitting: *Deleted

## 2024-01-22 DIAGNOSIS — K625 Hemorrhage of anus and rectum: Secondary | ICD-10-CM

## 2024-01-22 DIAGNOSIS — D509 Iron deficiency anemia, unspecified: Secondary | ICD-10-CM

## 2024-01-22 MED ORDER — NA SULFATE-K SULFATE-MG SULF 17.5-3.13-1.6 GM/177ML PO SOLN: ORAL | 0 refills | Status: DC

## 2024-01-22 NOTE — Telephone Encounter (Signed)
 Left message for patient to call back

## 2024-01-22 NOTE — Telephone Encounter (Signed)
-----   Message from Elspeth SHAUNNA Naval sent at 01/22/2024  7:29 AM EDT ----- Hi - can someone help coordinate EGD and colonoscopy for this patient last week of August or first 2 weeks of Sept. Dx is IDA and rectal bleeding. She will need to hold eliquis  for 48 hours prior to the exam. Thank you!  SA ----- Message ----- From: Hanford Powell BRAVO, NP Sent: 01/20/2024  10:09 AM EDT To: Nestor CHRISTELLA Blower, PA-C; Onita Mattock, MD; Stev#  Good morning.  I hope you had a lovely 4th of July. A colonoscopy being done in 3 months after restarting eliquis  would be end of August, early September. Would that work ok? Her blood count and iron  studies are being closely monitored by her primary care also. She should hold eliquis  for 48 hours prior to colonoscopy, and can restart it the next day. If anything happens int the interim to alert us  of bleeding, a diagnostic procedure would be warranted. We,will hold off hypercoagulability work up until she has been back on the eliquis  for 6 months, as that requires her being off the medication for a few weeks. Thank you so much for your time.  -Heather ----- Message ----- From: Naval Elspeth SHAUNNA, MD Sent: 01/16/2024   7:28 PM EDT To: Powell BRAVO Hanford, NP; Nestor CHRISTELLA Blower, PA#  Hi. Thanks for reaching out. Well, I guess with her bleeding symptoms and history of iron  deficiency, would normally do it as soon as she is cleared to hold it, so 3 months sounds like earliest. If you want us  to wait longer we can, but if she had something concerning causing this we are delaying the diagnosis. I guess we can see how she does over the next few months. If no symptoms and labs stable we could push to 6 months. If she has recurrent bleeding I may have to do a diagnostic exam sooner on the blood thinners just to make sure we take anything concerning off the ddx. Can you keep us  posted if you are following up with her? Thanks  Marcey ----- Message ----- From: Hanford Powell BRAVO, NP Sent:  01/16/2024   4:18 PM EDT To: Nestor CHRISTELLA Blower, PA-C; Onita Mattock, MD; Stev#  Good afternoon.  Dr. Mattock and I have just seen this patient. She is about a month into restart of eliquis . She has a new left popliteal DVT which is unprovoked. She developed some rectal bleeding right after she started the elquis and was referred to you. I see that the plan is for her to have a colonoscopy. The question being, how long to wait to do the test. We would like to wait at least 3 months after restarting eliquis  before eliquis  is held to perform the colonoscopy, which would be 03/13/2024. We would really like to wait closer to 6 months. She reports no further rectal bleeding at this time. Would you be agreeable to waiting that long, pending no other bleeding concerns come up? We will schedule a full set of coagulopathy labs to coincide with the timing of the colonoscopy, to limit the time she is off the eliquis .  I appreciate your time. Happy 4th of July.  Warm regards, Powell Hanford, NP [facility]

## 2024-01-22 NOTE — Telephone Encounter (Signed)
 Patient f/u. Please advise.

## 2024-01-22 NOTE — Telephone Encounter (Signed)
 Patient has scheduled endoscopy/colonoscopy on 03/18/24 at 1230 pm. Patient has been advised of time/date/location for upcoming procedure and has been given generalized verbal prep instructions. Discussed that a care partner 18 years or older should bring her, stay for the procedure and drive home due to sedation. Written instructions have been made available to the patient for additional review via mychart. Patient is also instructed that she should hold her Eliquis  48 hours prior to her procedure. She verbalizes understanding.

## 2024-01-27 ENCOUNTER — Telehealth: Payer: Self-pay

## 2024-01-27 NOTE — Telephone Encounter (Signed)
 Patient called stating that page 3 is the only one that needs to be corrected, the rest of the pages were okay.

## 2024-01-27 NOTE — Telephone Encounter (Signed)
 Please see message below in reference to [facility] paperwork  Copied from CRM [id number]. Topic: General - Other >> Jan 27, 2024 11:27 AM Cherylann RAMAN wrote: Reason for CRM: Patient requesting an update on FMLA paperwork. Alisa Kirks faxed over sheet that was not completed and sent back in. Please contact patient at [phone] for additional.

## 2024-01-27 NOTE — Telephone Encounter (Signed)
 I contacted back regarding her fmla paperwork, but no answer. I did received her fmla paperwork today, however I only received page 3. Page 1 & 2 is missing from the [employer].I left message for pt to call the clinic back.

## 2024-02-03 NOTE — Telephone Encounter (Signed)
 I called pt back regarding fmla paperwork, but no answer. I left message to call the clinic back.

## 2024-02-04 NOTE — Telephone Encounter (Signed)
 I contacted pt to let her know that her fmla form has been completed and ready for pick up. Form will be at the front desk for pick up. Pt requesting the form to be faxed @ [phone].

## 2024-02-11 ENCOUNTER — Other Ambulatory Visit: Payer: Self-pay | Admitting: Student

## 2024-02-11 DIAGNOSIS — Z1231 Encounter for screening mammogram for malignant neoplasm of breast: Secondary | ICD-10-CM

## 2024-02-20 ENCOUNTER — Ambulatory Visit
Admission: RE | Admit: 2024-02-20 | Discharge: 2024-02-20 | Disposition: A | Discharge status: Home / Self Care | Source: Ambulatory Visit

## 2024-02-20 DIAGNOSIS — Z1231 Encounter for screening mammogram for malignant neoplasm of breast: Secondary | ICD-10-CM

## 2024-02-26 ENCOUNTER — Encounter (HOSPITAL_COMMUNITY): Payer: Self-pay | Admitting: *Deleted

## 2024-02-26 ENCOUNTER — Emergency Department (HOSPITAL_COMMUNITY)
Admission: EM | Admit: 2024-02-26 | Discharge: 2024-02-27 | Disposition: A | Discharge status: Home / Self Care | Attending: Emergency Medicine | Admitting: Emergency Medicine

## 2024-02-26 ENCOUNTER — Emergency Department (HOSPITAL_COMMUNITY)

## 2024-02-26 ENCOUNTER — Other Ambulatory Visit: Payer: Self-pay

## 2024-02-26 DIAGNOSIS — Z7901 Long term (current) use of anticoagulants: Secondary | ICD-10-CM | POA: Insufficient documentation

## 2024-02-26 DIAGNOSIS — R079 Chest pain, unspecified: Secondary | ICD-10-CM | POA: Diagnosis present

## 2024-02-26 DIAGNOSIS — R0789 Other chest pain: Secondary | ICD-10-CM | POA: Diagnosis not present

## 2024-02-26 DIAGNOSIS — N189 Chronic kidney disease, unspecified: Secondary | ICD-10-CM | POA: Insufficient documentation

## 2024-02-26 LAB — BASIC METABOLIC PANEL WITH GFR
Anion gap: 12 (ref 5–15)
BUN: 12 mg/dL (ref 6–20)
CO2: 19 mmol/L — ABNORMAL LOW (ref 22–32)
Calcium: 9.4 mg/dL (ref 8.9–10.3)
Chloride: 107 mmol/L (ref 98–111)
Creatinine, Ser: 0.78 mg/dL (ref 0.44–1.00)
GFR, Estimated: >60 mL/min (ref >60)
Glucose, Bld: 95 mg/dL (ref 70–99)
Potassium: 3.7 mmol/L (ref 3.5–5.1)
Sodium: 138 mmol/L (ref 135–145)

## 2024-02-26 LAB — CBC
HCT: 40.1 % (ref 36.0–46.0)
Hemoglobin: 13.8 g/dL (ref 12.0–15.0)
MCH: 28 pg (ref 26.0–34.0)
MCHC: 34.4 g/dL (ref 30.0–36.0)
MCV: 81.5 fL (ref 80.0–100.0)
Platelets: 256 K/uL (ref 150–400)
RBC: 4.92 MIL/uL (ref 3.87–5.11)
RDW: 17.4 % — ABNORMAL HIGH (ref 11.5–15.5)
WBC: 5.5 K/uL (ref 4.0–10.5)
nRBC: 0 % (ref 0.0–0.2)

## 2024-02-26 LAB — TROPONIN I (HIGH SENSITIVITY)
Troponin I (High Sensitivity): <2 ng/L (ref <18)
Troponin I (High Sensitivity): <2 ng/L (ref <18)

## 2024-02-26 LAB — HCG, SERUM, QUALITATIVE: Preg, Serum: NEGATIVE

## 2024-02-26 NOTE — ED Provider Triage Note (Signed)
 Emergency Medicine Provider Triage Evaluation Note  Sonya Garner , a 43 y.o. female  was evaluated in triage.  Pt complains of CP that is sharp and squeezing. No agg/mitigating factors for pain. No new SOB. No cough or hemoptysis. CP started 1 week ago and has been intermittent. Constant since 10am this AM.   Endorses dizziness which she describes as a light headedness when she goes from sitting to standing. No fever or chills.   Hx of PE on eliquis .  No head injuries.     Review of Systems  Positive: CP Negative: Fever   Physical Exam  BP 131/84   Temp 99.2 F (37.3 C)   Resp 17   Ht 5' 4 (1.626 m)   Wt 79.7 kg   SpO2 100%   BMI 30.16 kg/m  Gen:   Awake, no distress   Resp:  Normal effort  MSK:   Moves extremities without difficulty  Other:  No leg swelling, lungs clear, HR is 94 RRR no murmurs  Medical Decision Making  Medically screening exam initiated at 4:21 PM.  Appropriate orders placed.  Sonya Garner was informed that the remainder of the evaluation will be completed by another provider, this initial triage assessment does not replace that evaluation, and the importance of remaining in the ED until their evaluation is complete.  Labs, CXR, ekg   Sonya Garner, Sonya Garner 02/26/24 1627

## 2024-02-26 NOTE — ED Triage Notes (Signed)
 C/O dizziness/CP/headache/numbness in right arm got 9 hours. Axox4 Denies n/v/ vision changes.

## 2024-02-26 NOTE — ED Triage Notes (Signed)
 The pt  has had a headache since this am she has not taken anything for her headache  hx migraines

## 2024-02-27 ENCOUNTER — Emergency Department (HOSPITAL_COMMUNITY)

## 2024-02-27 MED ORDER — IOHEXOL 350 MG/ML SOLN
75.0000 mL | Freq: Once | INTRAVENOUS | Status: AC | PRN
  Administered 2024-02-27: 75 mL via INTRAVENOUS

## 2024-02-27 MED ORDER — ACETAMINOPHEN 325 MG PO TABS
650.0000 mg | ORAL_TABLET | Freq: Once | ORAL | Status: AC
  Administered 2024-02-27: 650 mg via ORAL
  Filled 2024-02-27: qty 2

## 2024-02-27 NOTE — Discharge Instructions (Addendum)
 Thank you for allowing us  to care for you today.  Your workup was negative for pulmonary embolism.  At this time we believe your chest pain is musculoskeletal in origin.  You may take Tylenol  as needed for pain.  Please be sure to follow-up with your primary care provider and return to the emergency department if you experience worsening pain, worsening chest pain, shortness of breath, passout or believe need emergent medical care.

## 2024-02-27 NOTE — ED Provider Notes (Signed)
 [facility] Provider Note   CSN: [id number] Arrival date & time: 02/26/24  1555     Patient presents with: Dizziness   Sonya Garner is a 43 y.o. 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  Patient states that she has experienced chest pain and lightheadedness for approximately 1 week however the chest pain has acutely worsened over the past 24 hours causing her to present to the emergency department.  Patient states chest pain is substernal and associated with lightheadedness however denies associated shortness of breath, nausea, vomiting, fever, positional chest pain, syncope, trauma or falls.  Patient is compliant with her Eliquis  and does not have new unilateral swelling on exam however does state her lower extremities frequently swell after standing.    Dizziness      Prior to Admission medications   Medication Sig Start Date End Date Taking? Authorizing Provider  apixaban  (ELIQUIS ) 5 MG TABS tablet Take 1 tablet (5 mg total) by mouth 2 (two) times daily. 01/14/24   Zheng, Michael, DO  APIXABAN  (ELIQUIS ) VTE STARTER PACK (10MG  AND 5MG ) Take as directed on package: start with two-5mg  tablets twice daily for 7 days. On day 8, switch to one-5mg  tablet twice daily. 12/11/23   Lang Norleen POUR, PA-C  ferrous sulfate  325 (65 FE) MG tablet Take 1 tablet (325 mg total) by mouth every other day. 01/09/24   Elicia Sharper, DO  Na Sulfate-K Sulfate-Mg Sulfate concentrate (SUPREP) 17.5-3.13-1.6 GM/177ML SOLN Use as directed; may use generic; goodrx card if insurance will not cover generic 01/22/24   Armbruster, Elspeth SQUIBB, MD    Allergies: Allegra Kitten ]    Review of Systems  Neurological:  Positive for dizziness.    Updated Vital Signs BP 115/81   Pulse 63   Temp 97.7 F (36.5 C) (Oral)   Resp  16   Ht 5' 4 (1.626 m)   Wt 79.7 kg   SpO2 100%   BMI 30.16 kg/m   Physical Exam Vitals reviewed.  Constitutional:      General: She is not in acute distress.    Appearance: She is not ill-appearing.  HENT:     Head: Normocephalic and atraumatic.  Neck:     Vascular: No JVD.     Trachea: Trachea normal.  Cardiovascular:     Rate and Rhythm: Normal rate and regular rhythm.     Heart sounds: Normal heart sounds. No murmur heard. Pulmonary:     Effort: Pulmonary effort is normal. No tachypnea.     Breath sounds: Normal breath sounds.  Chest:     Comments: Reproducible anterior chest wall tenderness to palpation Musculoskeletal:     Right lower leg: No edema.     Left lower leg: No edema.     Comments: Spontaneous movement of bilateral upper and lower extremities  Skin:    General: Skin is warm.     Capillary Refill: Capillary refill takes less than 2 seconds.  Neurological:     Mental Status: She is alert.  Psychiatric:        Behavior: Behavior is cooperative.     (all labs ordered are listed, but only abnormal results are displayed) Labs Reviewed  BASIC METABOLIC PANEL WITH GFR - Abnormal; Notable for the following components:      Result Value   CO2 19 (*)  All other components within normal limits  CBC - Abnormal; Notable for the following components:   RDW 17.4 (*)    All other components within normal limits  HCG, SERUM, QUALITATIVE  TROPONIN I (HIGH SENSITIVITY)  TROPONIN I (HIGH SENSITIVITY)    EKG: EKG Interpretation Date/Time:  Wednesday February 26 2024 16:14:23 EDT Ventricular Rate:  94 PR Interval:  124 QRS Duration:  82 QT Interval:  354 QTC Calculation: 442 R Axis:   54  Text Interpretation: Normal sinus rhythm T wave abnormality, consider inferior ischemia Abnormal ECG When compared with ECG of 11-Dec-2023 16:35, PREVIOUS ECG IS PRESENT Confirmed by Ruthe Cornet [phone]) on 02/27/2024 8:11:26 AM  Radiology: CT Angio Chest PE W and/or Wo  Contrast Result Date: 02/27/2024 CLINICAL DATA:  Chest pain, headache, right arm numbness for 9 hours EXAM: CT ANGIOGRAPHY CHEST WITH CONTRAST TECHNIQUE: Multidetector CT imaging of the chest was performed using the standard protocol during bolus administration of intravenous contrast. Multiplanar CT image reconstructions and MIPs were obtained to evaluate the vascular anatomy. RADIATION DOSE REDUCTION: This exam was performed according to the departmental dose-optimization program which includes automated exposure control, adjustment of the mA and/or kV according to patient size and/or use of iterative reconstruction technique. CONTRAST:  75mL OMNIPAQUE  IOHEXOL  350 MG/ML SOLN COMPARISON:  02/26/2024, 12/11/2023 FINDINGS: Cardiovascular: This is a technically adequate evaluation of the pulmonary vasculature. There are no filling defects or pulmonary emboli. The heart is unremarkable without pericardial effusion. No evidence of thoracic aortic aneurysm or dissection. Mediastinum/Nodes: No enlarged mediastinal, hilar, or axillary lymph nodes. Thyroid gland, trachea, and esophagus demonstrate no significant findings. Lungs/Pleura: No acute airspace disease, effusion, or pneumothorax. Central airways are patent. Upper Abdomen: No acute abnormality. Musculoskeletal: No acute or destructive bony abnormalities. Reconstructed images demonstrate no additional findings. Review of the MIP images confirms the above findings. IMPRESSION: 1. No evidence of pulmonary embolus. 2. No acute intrathoracic process. Electronically Signed   By: Ozell Daring M.D.   On: 02/27/2024 10:35   DG Chest 2 View Result Date: 02/26/2024 CLINICAL DATA:  Chest pain. EXAM: CHEST - 2 VIEW COMPARISON:  Radiograph and CT 12/11/2023 FINDINGS: The cardiomediastinal contours are normal. The lungs are clear. Pulmonary vasculature is normal. No consolidation, pleural effusion, or pneumothorax. No acute osseous abnormalities are seen. IMPRESSION: No  active cardiopulmonary disease. Electronically Signed   By: Andrea Gasman M.D.   On: 02/26/2024 19:08     Procedures   Medications Ordered in the ED  acetaminophen  (TYLENOL ) tablet 650 mg (650 mg Oral Given 02/27/24 1018)  iohexol  (OMNIPAQUE ) 350 MG/ML injection 75 mL (75 mLs Intravenous Contrast Given 02/27/24 1005)    Clinical Course as of 02/27/24 1516  Thu Feb 27, 2024  0835 EKG reviewed by me: Normal sinus rhythm with normal axis and intervals.  T wave inversions in lead II and aVF.  Upon review of prior EKG, T wave inversions present in lead II however not in aVF  [AG]  0836 Chest x-ray reviewed by me: Trachea is midline.  No evidence of pneumothorax, cardiomegaly, pleural effusion, pulmonary edema or focal consolidation. [AG]  0851 Wells 1.5 [AG]  0856 HEART score 2 [AG]    Clinical Course User Index [AG] Nada Chroman, DO             HEART Score: 2                    Medical Decision Making Amount and/or Complexity of Data Reviewed Radiology: ordered.  Risk OTC drugs. Prescription drug management.   On initial evaluation patient is hemodynamically stable, afebrile, no acute distress.  Based upon patient's history and physical examination findings differential diagnosis include ACS/MI, pneumonia, pneumothorax, myocarditis/pericarditis, pulmonary embolism, musculoskeletal chest pain.  EKG and CTA will obtain laboratory studies as well as PE.  CTA PE ordered secondary to history of DVT and PE with Wells score 1.5.  Shared decision making with patient at bedside who wished to forego D-dimer and continue with CTA PE scan which I believe is reasonable at this time secondary to patient's medical history.  Laboratory studies without evidence of infection, anemia, electrolyte abnormalities.  Troponin within normal limits.  At this time believe patient's presentation is less likely ACS/MI as patient has no acute ischemia on EKG, troponins are within normal limits and patient has  reproducible chest wall tenderness to palpation.  Chest x-ray without evidence of pneumonia or pneumothorax.  CTA PE study without evidence of pulmonary embolism on exam.  At time believe patient's chest pain is secondary to musculoskeletal pain as she has reproducible tenderness on palpation.  I believe that the patient is safe to be discharged home with primary care follow-up, strict return precautions.  At the time of discharge patient was agreeable to plan of care and had no further questions     Final diagnoses:  Musculoskeletal chest pain    ED Discharge Orders     None       Lavanda Bolster DO Emergency Medicine PGY2    Bolster Lavanda, DO 02/27/24 1516    Tegeler, Lonni PARAS, MD 02/28/24 1251

## 2024-03-09 ENCOUNTER — Ambulatory Visit: Payer: Self-pay | Admitting: Student

## 2024-03-18 ENCOUNTER — Encounter: Payer: Self-pay | Admitting: Gastroenterology

## 2024-03-18 ENCOUNTER — Ambulatory Visit: Admitting: Gastroenterology

## 2024-03-18 VITALS — BP 118/69 | HR 96 | Temp 97.5°F | Resp 17 | Ht 64.0 in | Wt 175.9 lb

## 2024-03-18 DIAGNOSIS — D509 Iron deficiency anemia, unspecified: Secondary | ICD-10-CM | POA: Diagnosis not present

## 2024-03-18 DIAGNOSIS — K625 Hemorrhage of anus and rectum: Secondary | ICD-10-CM

## 2024-03-18 DIAGNOSIS — D120 Benign neoplasm of cecum: Secondary | ICD-10-CM

## 2024-03-18 DIAGNOSIS — K648 Other hemorrhoids: Secondary | ICD-10-CM

## 2024-03-18 DIAGNOSIS — K222 Esophageal obstruction: Secondary | ICD-10-CM | POA: Diagnosis not present

## 2024-03-18 MED ORDER — SODIUM CHLORIDE 0.9 % IV SOLN: 500.0000 mL | INTRAVENOUS | Status: DC

## 2024-03-18 NOTE — Progress Notes (Signed)
 Called to room to assist during endoscopic procedure.  Patient ID and intended procedure confirmed with present staff. Received instructions for my participation in the procedure from the performing physician.

## 2024-03-18 NOTE — Progress Notes (Signed)
 Sedate, gd SR, tolerated procedure well, VSS, report to RN

## 2024-03-18 NOTE — Op Note (Addendum)
 [facility] Patient Name: Sonya Garner Procedure Date: 03/18/2024 1:20 PM MRN: [id number] Endoscopist: Elspeth P. Leigh , MD, [id number] Age: 43 Referring MD:  Date of Birth: 1981/01/23 Gender: Female Account #: [id number] Procedure:                Colonoscopy Indications:              Rectal bleeding, Iron  deficiency anemia - now on                            Eliquis , history of DVT / PE Medicines:                Monitored Anesthesia Care Procedure:                Pre-Anesthesia Assessment:                           - Prior to the procedure, a History and Physical                            was performed, and patient medications and                            allergies were reviewed. The patient's tolerance of                            previous anesthesia was also reviewed. The risks                            and benefits of the procedure and the sedation                            options and risks were discussed with the patient.                            All questions were answered, and informed consent                            was obtained. Prior Anticoagulants: The patient has                            taken Eliquis  (apixaban ), last dose was 2 days                            prior to procedure. ASA Grade Assessment: III - A                            patient with severe systemic disease. After                            reviewing the risks and benefits, the patient was                            deemed in satisfactory condition to undergo the  procedure.                           After obtaining informed consent, the colonoscope                            was passed under direct vision. Throughout the                            procedure, the patient's blood pressure, pulse, and                            oxygen saturations were monitored continuously. The                            Olympus Scope SN [id number] was introduced through  the                            anus and advanced to the the terminal ileum, with                            identification of the appendiceal orifice and IC                            valve. The colonoscopy was performed without                            difficulty. The patient tolerated the procedure                            well. The quality of the bowel preparation was                            good. The terminal ileum, ileocecal valve,                            appendiceal orifice, and rectum were photographed. Scope In: 1:35:42 PM Scope Out: 1:46:18 PM Scope Withdrawal Time: 0 hours 7 minutes 54 seconds  Total Procedure Duration: 0 hours 10 minutes 36 seconds  Findings:                 The perianal and digital rectal examinations were                            normal.                           The terminal ileum appeared normal.                           A 3 mm polyp was found in the cecum. The polyp was                            sessile. The polyp was removed with a cold snare.  Resection and retrieval were complete.                           Internal hemorrhoids were found. The hemorrhoids                            were small.                           The exam was otherwise without abnormality. Of                            note, due to small size of rectum, retroflexed                            views not obtained (were very limited) Complications:            No immediate complications. Estimated blood loss:                            Minimal. Estimated Blood Loss:     Estimated blood loss was minimal. Impression:               - The examined portion of the ileum was normal.                           - One 3 mm polyp in the cecum, removed with a cold                            snare. Resected and retrieved.                           - Internal hemorrhoids.                           - The examination was otherwise normal.                            Suspect prior rectal bleeding could have been due                            to hemorrhoids, no other concerning pathology on                            this exam in regards to iron  deficiency. Recommendation:           - Patient has a contact number available for                            emergencies. The signs and symptoms of potential                            delayed complications were discussed with the                            patient. Return to normal activities  tomorrow.                            Written discharge instructions were provided to the                            patient.                           - Resume previous diet.                           - Continue present medications.                           - Resume Eliquis  tomrorow.                           - Await pathology results. Elspeth P. Peyson Delao, MD 03/18/2024 1:56:50 PM This report has been signed electronically.

## 2024-03-18 NOTE — Progress Notes (Signed)
 [facility] History and Physical   Primary Care Physician:  Tobie Gaines, DO   Reason for Procedure:   Iron  deficiency, rectal bleeding  Plan:    EGD and colonoscopy     HPI: Sonya Garner is a 43 y.o. female  here for EGD and colonoscopy  - prior history of IDA, has had rectal bleeding in the past. On Eliquis  for history of DVT / PE - cleared to hold anticoagulation at this time. Has been off it for 2 days.   Patient denies any bowel symptoms at this time. Denies further rectal bleeding since office visit. No prior endoscopic evaluation.  Otherwise feels well without any cardiopulmonary symptoms.   I have discussed risks / benefits of anesthesia and endoscopic procedure with Sonya Garner and they wish to proceed with the exams as outlined today.    Past Medical History:  Diagnosis Date   Chronic kidney disease    kidney stone   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   Qualifier: Diagnosis of   By: WATT, JOANNE       Mild concussion 12/05/2016   PE (pulmonary thromboembolism) (HCC) 11/14/2022    Past Surgical History:  Procedure Laterality Date   IR ANGIOGRAM PELVIS SELECTIVE OR SUPRASELECTIVE  04/25/2023   IR ANGIOGRAM SELECTIVE EACH ADDITIONAL VESSEL  04/25/2023   IR ANGIOGRAM SELECTIVE EACH ADDITIONAL VESSEL  04/25/2023   IR ANGIOGRAM SELECTIVE EACH ADDITIONAL VESSEL  04/25/2023   IR EMBO ART  VEN HEMORR LYMPH EXTRAV  INC GUIDE ROADMAPPING  04/25/2023   IR RADIOLOGIST EVAL & MGMT  05/24/2023   IR US  GUIDE VASC ACCESS LEFT  04/25/2023   TUBAL LIGATION      Prior to Admission medications   Medication Sig Start Date End Date Taking? Authorizing Provider  apixaban  (ELIQUIS ) 5 MG TABS tablet Take 1 tablet (5 mg total) by mouth 2 (two) times daily. 01/14/24   Zheng, Michael, DO  ferrous sulfate  325 (65 FE) MG tablet Take 1 tablet (325 mg total) by mouth every other day. 01/09/24   Zheng, Michael, DO    Current Outpatient Medications  Medication Sig Dispense  Refill   apixaban  (ELIQUIS ) 5 MG TABS tablet Take 1 tablet (5 mg total) by mouth 2 (two) times daily. 180 tablet 3   ferrous sulfate  325 (65 FE) MG tablet Take 1 tablet (325 mg total) by mouth every other day.     Current Facility-Administered Medications  Medication Dose Route Frequency Provider Last Rate Last Admin   0.9 %  sodium chloride  infusion  500 mL Intravenous Continuous Amantha Sklar, Elspeth SQUIBB, MD        Allergies as of 03/18/2024 - Review Complete 03/18/2024  Allergen Reaction Noted   Feraheme [ferumoxytol ] Shortness Of Breath 02/07/2023    Family History  Problem Relation Age of Onset   Breast cancer Maternal Grandmother    Cancer Maternal Grandmother    Breast cancer Maternal Aunt    Cancer Maternal Aunt        breast   Cancer Maternal Uncle    Hearing loss Neg Hx    Colon cancer Neg Hx    Esophageal cancer Neg Hx     Social History   Socioeconomic History   Marital status: Single    Spouse name: Not on file   Number of children: Not on file   Years of education: Not on file   Highest education level: Not on file  Occupational History   Not on file  Tobacco Use  Smoking status: Former    Average packs/day: 0.3 packs/day for 10.0 years (2.5 ttl pk-yrs)    Types: Cigarettes    Start date: 11/14/2022   Smokeless tobacco: Never   Tobacco comments:    Stopped 11/19/2022   Vaping Use   Vaping status: Every Day   Substances: Nicotine  Substance and Sexual Activity   Alcohol use: No   Drug use: No   Sexual activity: Yes    Birth control/protection: Surgical    Comment: BTL  Other Topics Concern   Not on file  Social History Narrative   Not on file   Social Drivers of Health   Financial Resource Strain: Not on file  Food Insecurity: No Food Insecurity (01/16/2024)   Hunger Vital Sign    Worried About Running Out of Food in the Last Year: Never true    Ran Out of Food in the Last Year: Never true  Transportation Needs: No Transportation Needs (01/16/2024)    PRAPARE - Administrator, Civil Service (Medical): No    Lack of Transportation (Non-Medical): No  Physical Activity: Not on file  Stress: Not on file  Social Connections: Moderately Isolated (11/26/2022)   Social Connection and Isolation Panel    Frequency of Communication with Friends and Family: More than three times a week    Frequency of Social Gatherings with Friends and Family: More than three times a week    Attends Religious Services: Never    Database administrator or Organizations: No    Attends Banker Meetings: Never    Marital Status: Living with partner  Intimate Partner Violence: Not At Risk (01/16/2024)   Humiliation, Afraid, Rape, and Kick questionnaire    Fear of Current or Ex-Partner: No    Emotionally Abused: No    Physically Abused: No    Sexually Abused: No    Review of Systems: All other review of systems negative except as mentioned in the HPI.  Physical Exam: Vital signs BP 126/77   Pulse 78   Temp (!) 97.5 F (36.4 C)   Ht 5' 4 (1.626 m)   Wt 175 lb 14.4 oz (79.8 kg)   SpO2 97%   BMI 30.19 kg/m   General:   Alert,  Well-developed, pleasant and cooperative in NAD Lungs:  Clear throughout to auscultation.   Heart:  Regular rate and rhythm Abdomen:  Soft, nontender and nondistended.   Neuro/Psych:  Alert and cooperative. Normal mood and affect. A and O x 3  Marcey Naval, MD [facility]

## 2024-03-18 NOTE — Patient Instructions (Addendum)
 Educational handout provided to patient related to Hemorrhoids & Polyps  Resume previous diet  Continue present medications  Awaiting pathology results  RESUME ELIQUIS  TOMORROW  YOU HAD AN ENDOSCOPIC PROCEDURE TODAY AT THE [facility]:   Refer to the procedure report that was given to you for any specific questions about what was found during the examination.  If the procedure report does not answer your questions, please call your gastroenterologist to clarify.  If you requested that your care partner not be given the details of your procedure findings, then the procedure report has been included in a sealed envelope for you to review at your convenience later.  YOU SHOULD EXPECT: Some feelings of bloating in the abdomen. Passage of more gas than usual.  Walking can help get rid of the air that was put into your GI tract during the procedure and reduce the bloating. If you had a lower endoscopy (such as a colonoscopy or flexible sigmoidoscopy) you may notice spotting of blood in your stool or on the toilet paper. If you underwent a bowel prep for your procedure, you may not have a normal bowel movement for a few days.  Please Note:  You might notice some irritation and congestion in your nose or some drainage.  This is from the oxygen used during your procedure.  There is no need for concern and it should clear up in a day or so.  SYMPTOMS TO REPORT IMMEDIATELY:  Following lower endoscopy (colonoscopy or flexible sigmoidoscopy):  Excessive amounts of blood in the stool  Significant tenderness or worsening of abdominal pains  Swelling of the abdomen that is new, acute  Fever of 100F or higher  Following upper endoscopy (EGD)  Vomiting of blood or coffee ground material  New chest pain or pain under the shoulder blades  Painful or persistently difficult swallowing  New shortness of breath  Fever of 100F or higher  Black, tarry-looking stools  For urgent or emergent  issues, a gastroenterologist can be reached at any hour by calling [phone]. Do not use MyChart messaging for urgent concerns.    DIET:  We do recommend a small meal at first, but then you may proceed to your regular diet.  Drink plenty of fluids but you should avoid alcoholic beverages for 24 hours.  ACTIVITY:  You should plan to take it easy for the rest of today and you should NOT DRIVE or use heavy machinery until tomorrow (because of the sedation medicines used during the test).    FOLLOW UP: Our staff will call the number listed on your records the next business day following your procedure.  We will call around 7:15- 8:00 am to check on you and address any questions or concerns that you may have regarding the information given to you following your procedure. If we do not reach you, we will leave a message.     If any biopsies were taken you will be contacted by phone or by letter within the next 1-3 weeks.  Please call us  at [phone] if you have not heard about the biopsies in 3 weeks.    SIGNATURES/CONFIDENTIALITY: You and/or your care partner have signed paperwork which will be entered into your electronic medical record.  These signatures attest to the fact that that the information above on your After Visit Summary has been reviewed and is understood.  Full responsibility of the confidentiality of this discharge information lies with you and/or your care-partner.

## 2024-03-18 NOTE — Op Note (Signed)
 [facility] Patient Name: Sonya Garner Procedure Date: 03/18/2024 1:20 PM MRN: [id number] Endoscopist: Elspeth P. Leigh , MD, [id number] Age: 43 Referring MD:  Date of Birth: November 12, 1980 Gender: Female Account #: [id number] Procedure:                Upper GI endoscopy Indications:              Iron  deficiency anemia Medicines:                Monitored Anesthesia Care Procedure:                Pre-Anesthesia Assessment:                           - Prior to the procedure, a History and Physical                            was performed, and patient medications and                            allergies were reviewed. The patient's tolerance of                            previous anesthesia was also reviewed. The risks                            and benefits of the procedure and the sedation                            options and risks were discussed with the patient.                            All questions were answered, and informed consent                            was obtained. Prior Anticoagulants: The patient has                            taken Eliquis  (apixaban ), last dose was 2 days                            prior to procedure. ASA Grade Assessment: III - A                            patient with severe systemic disease. After                            reviewing the risks and benefits, the patient was                            deemed in satisfactory condition to undergo the                            procedure.  After obtaining informed consent, the endoscope was                            passed under direct vision. Throughout the                            procedure, the patient's blood pressure, pulse, and                            oxygen saturations were monitored continuously. The                            Olympus Scope SN [id number] was introduced through the                            mouth, and advanced to the second part of  duodenum.                            The upper GI endoscopy was accomplished without                            difficulty. The patient tolerated the procedure                            well. Scope In: Scope Out: Findings:                 Esophagogastric landmarks were identified: the                            Z-line was found at 36 cm, the gastroesophageal                            junction was found at 36 cm and the upper extent of                            the gastric folds was found at 36 cm from the                            incisors.                           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                           The exam of the esophagus was otherwise normal.                           The  entire examined stomach was normal. Biopsies                            were taken with a cold forceps for Helicobacter                            pylori testing.                           The examined duodenum was normal. Complications:            No immediate complications. Estimated blood loss:                            Minimal. Estimated Blood Loss:     Estimated blood loss was minimal. Impression:               - Esophagogastric landmarks identified.                           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                           - Normal stomach. Biopsied.                           - Normal examined duodenum.                           No overt cause for iron  deficiency on this exam.                            Will await biopsy results. Recommendation:           - Patient has a contact number available for                            emergencies. The signs and symptoms of potential                            delayed complications were discussed with the                             patient. Return to normal activities tomorrow.                            Written discharge instructions were provided to the                            patient.                           - Resume previous diet.                           -  Continue present medications.                           - Resume Eliquis  tomorrow per colonoscopy note.                           - Await pathology results.                           - Await course on iron . If IDA persists despite                            iron  consider capsule endoscopy. However iron                             deficiency could also be due to menstrual loss. Elspeth P. Mariann Palo, MD 03/18/2024 1:59:35 PM This report has been signed electronically.

## 2024-03-19 ENCOUNTER — Telehealth: Payer: Self-pay

## 2024-03-19 NOTE — Telephone Encounter (Signed)
  Follow up Call-     03/18/2024   12:46 PM  Call back number  Post procedure Call Back phone  # [phone]  Permission to leave phone message Yes     Patient questions:  Do you have a fever, pain , or abdominal swelling? No. Pain Score  0 *  Have you tolerated food without any problems? Yes.    Have you been able to return to your normal activities? Yes.    Do you have any questions about your discharge instructions: Diet   No. Medications  No. Follow up visit  No.  Do you have questions or concerns about your Care? No.  Actions: * If pain score is 4 or above: No action needed, pain <4.

## 2024-03-23 ENCOUNTER — Ambulatory Visit: Payer: Self-pay | Admitting: Gastroenterology

## 2024-03-23 LAB — SURGICAL PATHOLOGY

## 2024-03-24 ENCOUNTER — Other Ambulatory Visit: Payer: Self-pay | Admitting: Student

## 2024-03-24 ENCOUNTER — Ambulatory Visit: Payer: Self-pay

## 2024-03-24 DIAGNOSIS — I824Z2 Acute embolism and thrombosis of unspecified deep veins of left distal lower extremity: Secondary | ICD-10-CM

## 2024-03-24 NOTE — Telephone Encounter (Signed)
 Called / informed pt Sonya Garner  was refilled today and sent to [facility].

## 2024-03-24 NOTE — Telephone Encounter (Signed)
 Medication sent to pharmacy

## 2024-03-24 NOTE — Telephone Encounter (Signed)
 FYI Only or Action Required?: FYI only for provider.  Patient was last seen in primary care on 01/08/2024 by Elicia Sharper, DO.  Called Nurse Triage reporting Advice Only.  Symptoms began n/a  Interventions attempted: Nothing.  Symptoms are: n/a.  Triage Disposition: Information or Advice Only Call  Patient/caregiver understands and will follow disposition?: Yes  Message from [facility] S sent at 03/24/2024  1:28 PM EDT  Patient is calling regarding a prescription refill for ELIQUIS  5 MG TABS tablet and it was denied by the provider. Please contact patient at [phone].   Reason for Disposition  Caller has medicine question only, adult not sick, AND triager answers question  Answer Assessment - Initial Assessment Questions 1. NAME of MEDICINE: What medicine(s) are you calling about?     Eliquis  5mg  tab 2. QUESTION: What is your question? (e.g., double dose of medicine, side effect)     Patient called to determine if anything is needed to have Eliquis  refilled, explained that authorization was sent to the pharmacy earlier in the hour. She verbalizes understanding  3. PRESCRIBER: Who prescribed the medicine? Reason: if prescribed by specialist, call should be referred to that group.     Dr. Tobie 4. SYMPTOMS: Do you have any symptoms? If Yes, ask: What symptoms are you having?  How bad are the symptoms (e.g., mild, moderate, severe)     N/A 5. PREGNANCY:  Is there any chance that you are pregnant? When was your last menstrual period?     N/A  Protocols used: Medication Question Call-A-AH

## 2024-03-30 ENCOUNTER — Encounter: Payer: Self-pay | Admitting: Gastroenterology

## 2024-04-06 ENCOUNTER — Encounter: Payer: Self-pay | Admitting: Obstetrics and Gynecology

## 2024-04-06 ENCOUNTER — Ambulatory Visit: Admitting: Obstetrics and Gynecology

## 2024-04-06 ENCOUNTER — Other Ambulatory Visit (HOSPITAL_COMMUNITY)
Admission: RE | Admit: 2024-04-06 | Discharge: 2024-04-06 | Disposition: A | Discharge status: Home / Self Care | Source: Ambulatory Visit | Attending: Obstetrics and Gynecology | Admitting: Obstetrics and Gynecology

## 2024-04-06 ENCOUNTER — Other Ambulatory Visit: Payer: Self-pay

## 2024-04-06 VITALS — BP 129/90 | HR 123 | Wt 180.0 lb

## 2024-04-06 DIAGNOSIS — Z3202 Encounter for pregnancy test, result negative: Secondary | ICD-10-CM | POA: Diagnosis not present

## 2024-04-06 DIAGNOSIS — I2699 Other pulmonary embolism without acute cor pulmonale: Secondary | ICD-10-CM

## 2024-04-06 DIAGNOSIS — Z113 Encounter for screening for infections with a predominantly sexual mode of transmission: Secondary | ICD-10-CM

## 2024-04-06 DIAGNOSIS — I824Z2 Acute embolism and thrombosis of unspecified deep veins of left distal lower extremity: Secondary | ICD-10-CM

## 2024-04-06 LAB — POCT PREGNANCY, URINE: Preg Test, Ur: NEGATIVE

## 2024-04-06 NOTE — Progress Notes (Signed)
  CC: follow up Subjective:    Patient ID: Sonya Garner, female    DOB: 1981-04-13, 43 y.o.   MRN: [id number]  HPI Pt seen for follow up and possible depo shot.  She has had another DVT in May/June.  She continues eliquis  but also received  depo provera .  As before, pt is s/p UFE last year and she denies heavy bleeding.  She had follow up with GI but did not get follow up with heme/onc.     Review of Systems     Objective:   Physical Exam Vitals:   04/06/24 0939 04/06/24 0957  BP: (!) 126/90 (!) 129/90  Pulse: (!) 125 (!) 123         Assessment & Plan:   1. Screening examination for STD (sexually transmitted disease) (Primary) Per pt request  - Cervicovaginal ancillary only( [place]) - RPR+HBsAg+HCVAb+...  2. Hx of Bilateral pulmonary embolism (HCC) Will refer to hematology for recommendations regarding future depo provera  with her history of DVT x 2.  Hold depo provera  for now - Ambulatory referral to Hematology / Oncology  3. DVT, lower extremity, distal, acute, left (HCC) See above - Ambulatory referral to Hematology / Oncology   I spent 20 minutes dedicated to the care of this patient including previsit review of records, face to face time with the patient discussing history, treatment and post visit testing.   Virtual visit in 4 months for follow up  Sonya DELENA Buddle, MD Faculty Attending, [facility]

## 2024-04-07 LAB — RPR+HBSAG+HCVAB+...
HIV Screen 4th Generation wRfx: NONREACTIVE
Hep C Virus Ab: NONREACTIVE
Hepatitis B Surface Ag: NEGATIVE
RPR Ser Ql: NONREACTIVE

## 2024-04-08 ENCOUNTER — Ambulatory Visit: Payer: Self-pay | Admitting: Obstetrics and Gynecology

## 2024-04-08 LAB — CERVICOVAGINAL ANCILLARY ONLY
Chlamydia: NEGATIVE
Comment: NEGATIVE
Comment: NEGATIVE
Comment: NORMAL
Neisseria Gonorrhea: NEGATIVE

## 2024-04-14 ENCOUNTER — Other Ambulatory Visit: Payer: Self-pay

## 2024-04-14 ENCOUNTER — Other Ambulatory Visit: Payer: Self-pay | Admitting: Nurse Practitioner

## 2024-04-14 DIAGNOSIS — I824Z2 Acute embolism and thrombosis of unspecified deep veins of left distal lower extremity: Secondary | ICD-10-CM

## 2024-04-15 ENCOUNTER — Telehealth: Payer: Self-pay | Admitting: Hematology

## 2024-04-15 ENCOUNTER — Telehealth: Payer: Self-pay

## 2024-04-15 ENCOUNTER — Inpatient Hospital Stay: Admitting: Hematology

## 2024-04-15 ENCOUNTER — Inpatient Hospital Stay: Attending: Nurse Practitioner

## 2024-04-15 DIAGNOSIS — I2699 Other pulmonary embolism without acute cor pulmonale: Secondary | ICD-10-CM | POA: Diagnosis not present

## 2024-04-15 DIAGNOSIS — I82512 Chronic embolism and thrombosis of left femoral vein: Secondary | ICD-10-CM | POA: Insufficient documentation

## 2024-04-15 DIAGNOSIS — R0902 Hypoxemia: Secondary | ICD-10-CM | POA: Diagnosis not present

## 2024-04-15 DIAGNOSIS — I824Z2 Acute embolism and thrombosis of unspecified deep veins of left distal lower extremity: Secondary | ICD-10-CM | POA: Diagnosis not present

## 2024-04-15 DIAGNOSIS — Z86711 Personal history of pulmonary embolism: Secondary | ICD-10-CM | POA: Insufficient documentation

## 2024-04-15 DIAGNOSIS — Z7901 Long term (current) use of anticoagulants: Secondary | ICD-10-CM | POA: Insufficient documentation

## 2024-04-15 DIAGNOSIS — R0600 Dyspnea, unspecified: Secondary | ICD-10-CM | POA: Insufficient documentation

## 2024-04-15 DIAGNOSIS — I82532 Chronic embolism and thrombosis of left popliteal vein: Secondary | ICD-10-CM | POA: Insufficient documentation

## 2024-04-15 LAB — CMP (CANCER CENTER ONLY)
ALT: 11 U/L (ref 0–44)
AST: 13 U/L — ABNORMAL LOW (ref 15–41)
Albumin: 4.4 g/dL (ref 3.5–5.0)
Alkaline Phosphatase: 54 U/L (ref 38–126)
Anion gap: 8 (ref 5–15)
BUN: 9 mg/dL (ref 6–20)
CO2: 24 mmol/L (ref 22–32)
Calcium: 9.2 mg/dL (ref 8.9–10.3)
Chloride: 106 mmol/L (ref 98–111)
Creatinine: 0.72 mg/dL (ref 0.44–1.00)
GFR, Estimated: >60 mL/min (ref >60)
Glucose, Bld: 119 mg/dL — ABNORMAL HIGH (ref 70–99)
Potassium: 3.4 mmol/L — ABNORMAL LOW (ref 3.5–5.1)
Sodium: 138 mmol/L (ref 135–145)
Total Bilirubin: 0.4 mg/dL (ref 0.0–1.2)
Total Protein: 7.2 g/dL (ref 6.5–8.1)

## 2024-04-15 LAB — CBC WITH DIFFERENTIAL (CANCER CENTER ONLY)
Abs Immature Granulocytes: 0.03 K/uL (ref 0.00–0.07)
Basophils Absolute: 0 K/uL (ref 0.0–0.1)
Basophils Relative: 1 %
Eosinophils Absolute: 0.1 K/uL (ref 0.0–0.5)
Eosinophils Relative: 2 %
HCT: 40.1 % (ref 36.0–46.0)
Hemoglobin: 14.1 g/dL (ref 12.0–15.0)
Immature Granulocytes: 1 %
Lymphocytes Relative: 42 %
Lymphs Abs: 2.2 K/uL (ref 0.7–4.0)
MCH: 28.8 pg (ref 26.0–34.0)
MCHC: 35.2 g/dL (ref 30.0–36.0)
MCV: 82 fL (ref 80.0–100.0)
Monocytes Absolute: 0.2 K/uL (ref 0.1–1.0)
Monocytes Relative: 5 %
Neutro Abs: 2.7 K/uL (ref 1.7–7.7)
Neutrophils Relative %: 49 %
Platelet Count: 275 K/uL (ref 150–400)
RBC: 4.89 MIL/uL (ref 3.87–5.11)
RDW: 15.7 % — ABNORMAL HIGH (ref 11.5–15.5)
WBC Count: 5.3 K/uL (ref 4.0–10.5)
nRBC: 0 % (ref 0.0–0.2)

## 2024-04-15 LAB — ANTITHROMBIN III: AntiThromb III Func: 100 % (ref 75–120)

## 2024-04-15 NOTE — Telephone Encounter (Signed)
 LVM for pt stating that Dr. Lanny wants to confirm that the pt is off anticoagulation medication and that she would like for the pt to come in today to have labs drawn.  Stated that Dr. Lanny would like to move the pt's OV appt to 1-2 weeks from now so they can discuss the pt's labs.  Instructed pt to contact Dr. Demetra office should she have additional questions or concerns.

## 2024-04-15 NOTE — Telephone Encounter (Signed)
 I left a message informing [name] of the changes made to her appointment today for a lab only and to follow up with Dr. Lanny in 2 weeks to go over her results.

## 2024-04-16 LAB — HOMOCYSTEINE: Homocysteine: 8.6 umol/L (ref 0.0–14.5)

## 2024-04-17 LAB — PROTEIN S, TOTAL: Protein S Ag, Total: 94 % (ref 60–150)

## 2024-04-17 LAB — PROTEIN S ACTIVITY: Protein S Activity: 103 % (ref 63–140)

## 2024-04-17 LAB — PROTEIN C ACTIVITY: Protein C Activity: 109 % (ref 73–180)

## 2024-04-18 LAB — LUPUS ANTICOAGULANT PANEL
DRVVT: 49.3 s — ABNORMAL HIGH (ref 0.0–47.0)
PTT Lupus Anticoagulant: 39.1 s (ref 0.0–43.5)

## 2024-04-18 LAB — DRVVT CONFIRM: dRVVT Confirm: 1.1 ratio (ref 0.8–1.2)

## 2024-04-18 LAB — PROTEIN C, TOTAL: Protein C, Total: 102 % (ref 60–150)

## 2024-04-18 LAB — BETA-2-GLYCOPROTEIN I ABS, IGG/M/A
Beta-2 Glyco I IgG: <9 GPI IgG units (ref 0–20)
Beta-2-Glycoprotein I IgA: <9 GPI IgA units (ref 0–25)
Beta-2-Glycoprotein I IgM: <9 GPI IgM units (ref 0–32)

## 2024-04-18 LAB — DRVVT MIX: dRVVT Mix: 43.1 s — ABNORMAL HIGH (ref 0.0–40.4)

## 2024-04-19 LAB — CARDIOLIPIN ANTIBODIES, IGG, IGM, IGA
Anticardiolipin IgA: <9 U/mL (ref 0–11)
Anticardiolipin IgG: 16 GPL U/mL — ABNORMAL HIGH (ref 0–14)
Anticardiolipin IgM: <9 [MPL'U]/mL (ref 0–12)

## 2024-04-20 LAB — FACTOR 5 LEIDEN

## 2024-04-20 LAB — PROTHROMBIN GENE MUTATION

## 2024-04-28 DIAGNOSIS — Z86718 Personal history of other venous thrombosis and embolism: Secondary | ICD-10-CM | POA: Insufficient documentation

## 2024-04-28 NOTE — Assessment & Plan Note (Signed)
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 -Mended lifelong anticoagulation -Hypercoagulopathy workup was negative in October 2025

## 2024-04-29 ENCOUNTER — Other Ambulatory Visit: Payer: Self-pay

## 2024-04-29 ENCOUNTER — Emergency Department (HOSPITAL_COMMUNITY)

## 2024-04-29 ENCOUNTER — Encounter (HOSPITAL_COMMUNITY): Payer: Self-pay

## 2024-04-29 ENCOUNTER — Inpatient Hospital Stay: Admitting: Hematology

## 2024-04-29 ENCOUNTER — Emergency Department (HOSPITAL_COMMUNITY)
Admission: EM | Admit: 2024-04-29 | Discharge: 2024-04-29 | Disposition: A | Discharge status: Home / Self Care | Attending: Emergency Medicine | Admitting: Emergency Medicine

## 2024-04-29 VITALS — BP 136/82 | HR 123 | Temp 98.1°F | Resp 17 | Ht 64.0 in | Wt 186.6 lb

## 2024-04-29 DIAGNOSIS — Z86718 Personal history of other venous thrombosis and embolism: Secondary | ICD-10-CM

## 2024-04-29 DIAGNOSIS — R0602 Shortness of breath: Secondary | ICD-10-CM | POA: Diagnosis not present

## 2024-04-29 DIAGNOSIS — Z7901 Long term (current) use of anticoagulants: Secondary | ICD-10-CM | POA: Diagnosis not present

## 2024-04-29 DIAGNOSIS — M79605 Pain in left leg: Secondary | ICD-10-CM | POA: Insufficient documentation

## 2024-04-29 DIAGNOSIS — I82512 Chronic embolism and thrombosis of left femoral vein: Secondary | ICD-10-CM | POA: Diagnosis not present

## 2024-04-29 LAB — COMPREHENSIVE METABOLIC PANEL WITH GFR
ALT: 19 U/L (ref 0–44)
AST: 21 U/L (ref 15–41)
Albumin: 4.3 g/dL (ref 3.5–5.0)
Alkaline Phosphatase: 61 U/L (ref 38–126)
Anion gap: 10 (ref 5–15)
BUN: 10 mg/dL (ref 6–20)
CO2: 23 mmol/L (ref 22–32)
Calcium: 9.5 mg/dL (ref 8.9–10.3)
Chloride: 106 mmol/L (ref 98–111)
Creatinine, Ser: 0.84 mg/dL (ref 0.44–1.00)
GFR, Estimated: >60 mL/min (ref >60)
Glucose, Bld: 96 mg/dL (ref 70–99)
Potassium: 3.8 mmol/L (ref 3.5–5.1)
Sodium: 139 mmol/L (ref 135–145)
Total Bilirubin: 0.2 mg/dL (ref 0.0–1.2)
Total Protein: 7.1 g/dL (ref 6.5–8.1)

## 2024-04-29 LAB — CBC WITH DIFFERENTIAL/PLATELET
Abs Immature Granulocytes: 0.01 K/uL (ref 0.00–0.07)
Basophils Absolute: 0 K/uL (ref 0.0–0.1)
Basophils Relative: 1 %
Eosinophils Absolute: 0.1 K/uL (ref 0.0–0.5)
Eosinophils Relative: 2 %
HCT: 42.7 % (ref 36.0–46.0)
Hemoglobin: 14.2 g/dL (ref 12.0–15.0)
Immature Granulocytes: 0 %
Lymphocytes Relative: 43 %
Lymphs Abs: 2.1 K/uL (ref 0.7–4.0)
MCH: 28.6 pg (ref 26.0–34.0)
MCHC: 33.3 g/dL (ref 30.0–36.0)
MCV: 85.9 fL (ref 80.0–100.0)
Monocytes Absolute: 0.4 K/uL (ref 0.1–1.0)
Monocytes Relative: 7 %
Neutro Abs: 2.3 K/uL (ref 1.7–7.7)
Neutrophils Relative %: 47 %
Platelets: 276 K/uL (ref 150–400)
RBC: 4.97 MIL/uL (ref 3.87–5.11)
RDW: 15.5 % (ref 11.5–15.5)
WBC: 4.9 K/uL (ref 4.0–10.5)
nRBC: 0 % (ref 0.0–0.2)

## 2024-04-29 LAB — PROTIME-INR
INR: 1 (ref 0.8–1.2)
Prothrombin Time: 13.9 s (ref 11.4–15.2)

## 2024-04-29 MED ORDER — IOHEXOL 350 MG/ML SOLN
75.0000 mL | Freq: Once | INTRAVENOUS | Status: AC | PRN
  Administered 2024-04-29: 75 mL via INTRAVENOUS

## 2024-04-29 MED ORDER — HYDROCODONE-ACETAMINOPHEN 5-325 MG PO TABS: 1.0000 | ORAL_TABLET | ORAL | 0 refills | Status: AC | PRN

## 2024-04-29 MED ORDER — ENOXAPARIN SODIUM 120 MG/0.8ML IJ SOSY
120.0000 mg | PREFILLED_SYRINGE | Freq: Once | INTRAMUSCULAR | Status: AC
  Administered 2024-04-29: 120 mg via SUBCUTANEOUS
  Filled 2024-04-29: qty 0.8

## 2024-04-29 MED ORDER — SODIUM CHLORIDE (PF) 0.9 % IJ SOLN
INTRAMUSCULAR | Status: AC
  Filled 2024-04-29: qty 50

## 2024-04-29 NOTE — Discharge Instructions (Addendum)
 You were given a shot of Lovenox on today's visit, you will not need to take your Eliquis  until tomorrow night.  Please go to [facility] tomorrow morning in order to obtain an ultrasound of your left lower leg.  Return to the emergency department if any of your symptoms worsen.

## 2024-04-29 NOTE — Progress Notes (Addendum)
 [facility]   [phone] [phone]   Clinic Follow up Note   Patient Care Team: Tobie Gaines, DO as PCP - General (Internal Medicine) Elicia Sharper, DO as PCP - Internal Medicine Armbruster, Elspeth SQUIBB, MD as Consulting Physician (Gastroenterology) Mollie Nestor CHRISTELLA DEVONNA (Gastroenterology) Lanny Callander, MD as Consulting Physician (Hematology)  Date of Service:  04/29/2024  CHIEF COMPLAINT: f/u of current PE/DVT  CURRENT THERAPY:  Eliquis  5 mg twice daily  Oncology History   Personal history of venous thrombosis and embolism - Recurrent unprovoked PE and DVT, 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 Off Eliquis  briefly for colonoscopy and blood test. Recent tests show no genetic mutations or abnormalities. Potassium level slightly low. Current symptoms may indicate acute DVT, or acute on chronic DVT  - Order Doppler ultrasound of the leg to assess for acute DVT. - Advise wearing compression stockings, elevating the leg, and exercising to help resolve edema. - If leg swelling does not improve in a month, consider referral to a vascular surgeon. - Advise increasing dietary potassium intake to address low potassium levels.  Shortness of breath and hypoxia on exertion Shortness of breath on exertion, particularly when walking or climbing stairs. Needs to stop halfway up stairs due to breathing difficulties, even when leg is not swollen. Oxygen saturation is currently 99%. - Conduct a six-minute walk test in the office to assess for oxygen desaturation during exertion.  Her pulse ox dropped to 89% after walking, and she became moderately out of breath.  Plan - Her oxygen level dropped from 99%  to 89% on 6-minute walking in the office, she has moderate dyspnea on exertion, and worsening left lower extremity edema, concerning for new PE and a DVT, I will send her to [facility] emergency room.  Patient agrees. - If images confirm new thrombosis, this could be related to holding Eliquis  dose for 3-4 days about 2 weeks ago for the lab work, I still prefer changing her tx to lovenox or coumadin  - Will follow-up in the hospital if she gets admitted.  Addendum - Her CT of chest was negative for PE.  She was discharged from ED - She has to Doppler of lower extremity the next day morning, which showed chronic femoral and popliteal vein thrombosis. - I recommend her to increase Eliquis  to 10 mg twice daily for 7 days, then changed back to 5 mg twice daily - Will refer her to vascular surgeon for chronic DVT. -I will see her in 6 months   Discussed the use of AI scribe software for clinical note transcription with the patient, who gave verbal consent to proceed.  History of Present Illness Sonya Garner is a 43 year old female with recurrent deep vein thrombosis who presents with shortness of breath and leg swelling.  She experiences recurrent episodes of shortness of breath and leg pain, with current swelling and pain in the leg, especially when walking. The leg swelling has persisted for about a week, with a sensation of tightness in the calf.  She has a history of deep vein thrombosis with previous ultrasounds showing chronic blood clots. She recently underwent a colonoscopy and endoscopy, during which she temporarily discontinued Eliquis . She resumed Eliquis  shortly after the procedures and has been  taking it consistently at 5 mg twice daily. She was off Eliquis  for approximately three days before a blood test on April 15, 2024.  Her shortness of breath significantly impacts her ability to walk distances and climb stairs. She often needs to stop halfway up the stairs in her home due to  breathlessness, even when her leg is not swollen, and requires support to assist her when climbing stairs. No other symptoms are present.     All other systems were reviewed with the patient and are negative.  MEDICAL HISTORY:  Past Medical History:  Diagnosis Date   Chronic kidney disease    kidney stone   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   Qualifier: Diagnosis of   By: WATT, JOANNE       Mild concussion 12/05/2016   PE (pulmonary thromboembolism) (HCC) 11/14/2022    SURGICAL HISTORY: Past Surgical History:  Procedure Laterality Date   IR ANGIOGRAM PELVIS SELECTIVE OR SUPRASELECTIVE  04/25/2023   IR ANGIOGRAM SELECTIVE EACH ADDITIONAL VESSEL  04/25/2023   IR ANGIOGRAM SELECTIVE EACH ADDITIONAL VESSEL  04/25/2023   IR ANGIOGRAM SELECTIVE EACH ADDITIONAL VESSEL  04/25/2023   IR EMBO ART  VEN HEMORR LYMPH EXTRAV  INC GUIDE ROADMAPPING  04/25/2023   IR RADIOLOGIST EVAL & MGMT  05/24/2023   IR US  GUIDE VASC ACCESS LEFT  04/25/2023   TUBAL LIGATION      I have reviewed the social history and family history with the patient and they are unchanged from previous note.  ALLERGIES:  is allergic to feraheme [ferumoxytol ].  MEDICATIONS:  Current Outpatient Medications  Medication Sig Dispense Refill   ELIQUIS  5 MG TABS tablet TAKE 1 TABLET(5 MG) BY MOUTH TWICE DAILY 180 tablet 3   ferrous sulfate  325 (65 FE) MG tablet Take 1 tablet (325 mg total) by mouth every other day.     No current facility-administered medications for this visit.    PHYSICAL EXAMINATION: ECOG PERFORMANCE STATUS: 1 - Symptomatic but completely ambulatory  Vitals:   04/29/24 1354 04/29/24 1355  BP:    Pulse: (!) 119 (!) 121  Resp:    Temp:    SpO2: 94% 91%   Wt Readings from Last 3 Encounters:  04/29/24 186 lb 9.6 oz (84.6 kg)  04/06/24 180 lb (81.6 kg)  03/18/24 175 lb 14.4 oz (79.8 kg)     GENERAL:alert, no distress and comfortable SKIN: skin color, texture, turgor are normal, no rashes or  significant lesions EYES: normal, Conjunctiva are pink and non-injected, sclera clear NECK: supple, thyroid normal size, non-tender, without nodularity LYMPH:  no palpable lymphadenopathy in the cervical, axillary  LUNGS: clear to auscultation and percussion with normal breathing effort HEART: regular rate & rhythm and no murmurs and no lower extremity edema ABDOMEN:abdomen soft, non-tender and normal bowel sounds Musculoskeletal:no cyanosis of digits and no clubbing  NEURO: alert & oriented x 3 with fluent speech, no focal motor/sensory deficits  Physical Exam EXTREMITIES: Left leg swollen compared to right leg.  LABORATORY DATA:  I have reviewed the data as listed    Latest Ref Rng & Units 04/15/2024    1:32 PM 02/26/2024    4:40 PM 01/08/2024    3:27 PM  CBC  WBC 4.0 - 10.5 K/uL 5.3  5.5  4.3   Hemoglobin 12.0 - 15.0 g/dL 85.8  86.1  87.3   Hematocrit 36.0 - 46.0 % 40.1  40.1  39.3   Platelets 150 - 400 K/uL 275  256  217  Latest Ref Rng & Units 04/15/2024    1:32 PM 02/26/2024    4:40 PM 12/12/2023   10:55 AM  CMP  Glucose 70 - 99 mg/dL 880  95  888   BUN 6 - 20 mg/dL 9  12  9    Creatinine 0.44 - 1.00 mg/dL 9.27  9.21  9.15   Sodium 135 - 145 mmol/L 138  138  139   Potassium 3.5 - 5.1 mmol/L 3.4  3.7  3.6   Chloride 98 - 111 mmol/L 106  107  107   CO2 22 - 32 mmol/L 24  19  25    Calcium 8.9 - 10.3 mg/dL 9.2  9.4  9.3   Total Protein 6.5 - 8.1 g/dL 7.2   7.1   Total Bilirubin 0.0 - 1.2 mg/dL 0.4   0.4   Alkaline Phos 38 - 126 U/L 54   42   AST 15 - 41 U/L 13   16   ALT 0 - 44 U/L 11   13       RADIOGRAPHIC STUDIES: I have personally reviewed the radiological images as listed and agreed with the findings in the report. No results found.    No orders of the defined types were placed in this encounter.  All questions were answered. The patient knows to call the clinic with any problems, questions or concerns. No barriers to learning was detected. The total  time spent in the appointment was 40 minutes, including review of chart and various tests results, discussions about plan of care and coordination of care plan     Onita Mattock, MD 04/29/2024

## 2024-04-29 NOTE — ED Notes (Signed)
 Walked pt 35ft o2 stayed at 100% pulse went up to 105 said she felt a little shob

## 2024-04-29 NOTE — ED Triage Notes (Signed)
 [facility] from [facility] due [facility]B and leg pain with Hx of DVT, PE. Pt c/o pain in Left lower leg, SOB; pt failed walk and desated to 89% when ambulating; Tachy at 129. Pt currently on Eliquis .

## 2024-04-29 NOTE — ED Provider Notes (Signed)
 [facility] AT [facility] Provider Note   CSN: [id number] Arrival date & time: 04/29/24  1426     Patient presents with: Leg Pain and Shortness of Breath   Sonya Garner is a 43 y.o. female.   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 Ileana office for shortness of breath.  Patient reports about 2 weeks ago she was evaluated at cancer center, had study to rule out hypercoagulability.  She reports she went over to her office today to over results.  She states that she was noted to be limping, complaining of left leg pain along with swelling.  She had a 6-minute walking test which she felt with an O2 saturation of 89%.  Her first DVT and PE were around May 2024, she has been on Eliquis  since.  Dr. Ileana wanted her further evaluated to make sure that the medication was working, that she did not need to have a switch in medication.  She is short of breath with any type of movement along with any activity.  Has not had any coughs, fever, nausea, vomiting, sick contacts.  The history is provided by the spouse.  Leg Pain Location:  Leg Associated symptoms: no back pain and no fever   Shortness of Breath Associated symptoms: no abdominal pain, no chest pain, no fever, no headaches, no sore throat and no vomiting        Prior to Admission medications   Medication Sig Start Date End Date Taking? Authorizing Provider  HYDROcodone-acetaminophen  (NORCO/VICODIN) 5-325 MG tablet Take 1 tablet by mouth every 4 (four) hours as needed for up to 3 days. 04/29/24 05/02/24 Yes Tasnia Spegal, PA-C  ELIQUIS  5 MG TABS tablet TAKE 1 TABLET(5 MG) BY MOUTH TWICE DAILY 03/24/24   Tobie Gaines, DO  ferrous sulfate  325 (65 FE) MG tablet Take 1 tablet (325 mg total) by mouth every other day. 01/09/24   Zheng, Michael, DO    Allergies: Allegra Logan ]    Review of Systems  Constitutional:   Negative for chills and fever.  HENT:  Negative for sore throat.   Respiratory:  Positive for shortness of breath.   Cardiovascular:  Negative for chest pain.  Gastrointestinal:  Negative for abdominal pain, nausea and vomiting.  Genitourinary:  Negative for flank pain.  Musculoskeletal:  Negative for back pain.  Neurological:  Negative for light-headedness and headaches.  All other systems reviewed and are negative.   Updated Vital Signs BP (!) 123/96 (BP Location: Left Arm)   Pulse 83   Temp 97.8 F (36.6 C) (Oral)   Resp 19   SpO2 97%   Physical Exam Vitals and nursing note reviewed.  Constitutional:      Appearance: She is well-developed.  HENT:     Head: Normocephalic and atraumatic.  Cardiovascular:     Rate and Rhythm: Normal rate.  Pulmonary:     Effort: Pulmonary effort is normal.     Breath sounds: No decreased breath sounds or wheezing.  Chest:     Chest wall: No tenderness.  Abdominal:     Palpations: Abdomen is soft.  Musculoskeletal:     Cervical back: Normal range of motion and neck supple.     Right lower leg: No tenderness. No edema.     Left lower leg: Tenderness present. No edema.  Skin:    General: Skin is warm and dry.  Neurological:     Mental Status: She is alert and oriented to person, place, and time.     (all labs ordered are listed, but only abnormal results are displayed) Labs Reviewed  CBC WITH DIFFERENTIAL/PLATELET  PROTIME-INR  COMPREHENSIVE METABOLIC PANEL WITH GFR    EKG: EKG Interpretation Date/Time:  Wednesday April 29 2024 14:43:42 EDT Ventricular Rate:  94 PR Interval:  125 QRS Duration:  80 QT Interval:  355 QTC Calculation: 444 R Axis:   54  Text Interpretation: Sinus rhythm Borderline T abnormalities, anterior leads No significant change since last tracing Confirmed by Dreama Longs ([id number]) on 04/29/2024 6:38:25 PM  Radiology: CT Angio Chest PE W and/or Wo Contrast Result Date: 04/29/2024 CLINICAL DATA:   Shortness of breath.  History of DVT. EXAM: CT ANGIOGRAPHY CHEST WITH CONTRAST TECHNIQUE: Multidetector CT imaging of the chest was performed using the standard protocol during bolus administration of intravenous contrast. Multiplanar CT image reconstructions and MIPs were obtained to evaluate the vascular anatomy. RADIATION DOSE REDUCTION: This exam was performed according to the departmental dose-optimization program which includes automated exposure control, adjustment of the mA and/or kV according to patient size and/or use of iterative reconstruction technique. CONTRAST:  75mL OMNIPAQUE  IOHEXOL  350 MG/ML SOLN COMPARISON:  02/27/2024 FINDINGS: Cardiovascular: Heart is normal size. Aorta is normal caliber. No filling defects in the pulmonary arteries to suggest pulmonary emboli. Mediastinum/Nodes: No mediastinal, hilar, or axillary adenopathy. Trachea and esophagus are unremarkable. Thyroid unremarkable. Lungs/Pleura: Lungs are clear. No focal airspace opacities or suspicious nodules. No effusions. No pneumothorax. Upper Abdomen: No acute findings Musculoskeletal: Chest wall soft tissues are unremarkable. No acute bony abnormality. Review of the MIP images confirms the above findings. IMPRESSION: No evidence of pulmonary embolus. No acute cardiopulmonary disease. Electronically Signed   By: Franky Crease M.D.   On: 04/29/2024 19:14   DG Chest 2 View Result Date: 04/29/2024 EXAM: 2 VIEW(S) XRAY OF THE CHEST 04/29/2024 03:32:00 PM COMPARISON: 02/26/2024 CLINICAL HISTORY: SOB. FINDINGS: LUNGS AND PLEURA: No focal pulmonary opacity. No pulmonary edema. No pleural effusion. No pneumothorax. HEART AND MEDIASTINUM: No acute abnormality of the cardiac and mediastinal silhouettes. BONES AND SOFT TISSUES: No acute osseous abnormality. IMPRESSION: 1. No acute cardiopulmonary abnormality detected. Electronically signed by: Lynwood Seip MD 04/29/2024 03:38 PM EDT RP Workstation: [id number]     Procedures   Medications  Ordered in the ED  iohexol  (OMNIPAQUE ) 350 MG/ML injection 75 mL (75 mLs Intravenous Contrast Given 04/29/24 1856)  enoxaparin (LOVENOX) injection 120 mg (120 mg Subcutaneous Given 04/29/24 2042)                                    Medical Decision Making Amount and/or Complexity of Data Reviewed Labs: ordered. Radiology: ordered.  Risk Prescription drug management.   This patient presents to the ED for concern of shortness of breath, this involves a number of treatment options, and is a complaint that carries with it a high risk of complications and morbidity.  The differential diagnosis includes infection, pulmonary embolism, ACS.    Co morbidities: Discussed in HPI   Brief History:  SEE HPI.   EMR reviewed including pt PMHx, past surgical history and past visits to ER.   See HPI for more details   Lab Tests:  I ordered and independently interpreted labs.  The pertinent results include:    CBC with no leukocytosis, hemoglobin normal limits.  CMP with no  Electra derangement, creatinine levels unremarkable.  LFTs are within normal limits.  T and INR are normal.   Imaging Studies:  CT angio chest: IMPRESSION:  No evidence of pulmonary embolus.    No acute cardiopulmonary disease.   Chest x-ray without any infiltrates, or any signs of infection.  Cardiac Monitoring:  The patient was maintained on a cardiac monitor.  I personally viewed and interpreted the cardiac monitored which showed an underlying rhythm of:NSR 91 EKG non-ischemic   Medicines ordered:  I ordered medication including lovenox  for anticoagulation Reevaluation of the patient after these medicines showed that the patient stayed the same I have reviewed the patients home medicines and have made adjustments as needed  Consults:  I requested consultation with Dr. Loretha of Oncology,  and discussed lab and imaging findings as well as pertinent plan - they recommend: Patient can be discharged,  will need DVT study in the morning, will need to receive Lovenox tonight along with continue her Eliquis  after DVT study if negative tomorrow.   Reevaluation:  After the interventions noted above I re-evaluated patient and found that they have :stayed the same  Social Determinants of Health:  The patient's social determinants of health were a factor in the care of this patient  Problem List / ED Course:  Presented to the ED from oncology due to shortness of breath, followed by Dr. Wilmer, who noted patient had being left lower leg pain.  She is currently anticoagulated on Eliquis  5 mg twice daily, and states that she has not missed any doses since.  However, once speaking to oncology on-call it does look like she stopped taking this medication for a couple of days.  She is noticing a lot of pain whenever she walks.  She was referred here to rule out pulmonary embolism. CBC with no leukocytosis, CMP is unremarkable.  Does not appear volume overloaded on exam.  Lungs are clear to auscultation.  Chest x-ray without any signs of infection. CT angio chest did not show any acute findings such as a pulmonary embolism. I discussed this case with oncology on-call who recommended DVT study in the morning, will need Lovenox tonight, along with if negative DVT study tomorrow will need to continue her Eliquis  and appropriate follow-up. Patient has not had any medication for pain of her lower leg on today's visit.  Her vitals have been within normal limits.  She was noted to be hypoxic at the oncology office, here she has maintained her sats about 97%.  Her tachycardia has improved since arrival.  She was ambulated by her nursing staff without any signs of hypoxia. I discussed anticoagulation with pharmacy.  Patient would benefit from Lovenox injection at this time modified and will resume her Eliquis  tomorrow night.  She was instructed on this.  She was also given a prescription for pain control.  Patient  hemodynamically stable for discharge.   Dispostion:  After consideration of the diagnostic results and the patients response to treatment, I feel that the patent would benefit from an ultrasound study, continue Eliquis  tomorrow night.    Portions of this note were generated with Scientist, clinical (histocompatibility and immunogenetics). Dictation errors may occur despite best attempts at proofreading.   Final diagnoses:  Left leg pain    ED Discharge Orders          Ordered    LE Venous       Comments: IMPORTANT PATIENT INSTRUCTIONS: You have been scheduled for an Outpatient Vascular Study at [facility].  If tomorrow is a Saturday, Sunday or holiday, please go to the [facility] Registration Desk at 11 am tomorrow morning and tell them you are there for a vascular study.  If tomorrow is a weekday (Monday-Friday), please go to the [facility] (address [address], [place]) at 8 am and report to the 4th floor registration Zone A.  Inform registration that you are there for a vascular study.   04/29/24 2002    HYDROcodone-acetaminophen (NORCO/VICODIN) 5-325 MG tablet  Every 4 hours PRN        10 /15/25 2047               Findley Vi, PA-C 04/29/24 2047    Dreama Longs, MD 04/30/24 [phone]

## 2024-04-30 ENCOUNTER — Other Ambulatory Visit: Payer: Self-pay

## 2024-04-30 ENCOUNTER — Telehealth: Payer: Self-pay | Admitting: Hematology

## 2024-04-30 ENCOUNTER — Ambulatory Visit (HOSPITAL_COMMUNITY)
Admission: RE | Admit: 2024-04-30 | Discharge: 2024-04-30 | Disposition: A | Discharge status: Home / Self Care | Source: Ambulatory Visit | Attending: Hematology | Admitting: Hematology

## 2024-04-30 ENCOUNTER — Telehealth: Payer: Self-pay

## 2024-04-30 DIAGNOSIS — Z86718 Personal history of other venous thrombosis and embolism: Secondary | ICD-10-CM | POA: Insufficient documentation

## 2024-04-30 NOTE — Telephone Encounter (Signed)
 Sonya Garner has been contacted and made aware of her 6 month follow up appt.

## 2024-04-30 NOTE — Addendum Note (Signed)
 Addended by: LANNY CALLANDER on: 04/30/2024 11:44 AM   Modules accepted: Orders

## 2024-04-30 NOTE — Telephone Encounter (Signed)
 Report called regarding pt's doppler of LLE.  Notified Dr. Lanny of the results.    Per Dr. Demetra verbal orders, informed pt that Dr Lanny would like for the pt to continue taking the 10mg  of the Eliquis  daily and that Dr. Lanny will proceed with the referral to vascular surgery.  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  Instructed pt to go to the ER if the pain and SOB continues to get worse.  Also, instructed pt to purchase a Pulse Ox to measure pt's oxygenation & HR.  Pt verbalized understanding and had no further questions.

## 2024-05-04 ENCOUNTER — Encounter: Payer: Self-pay | Admitting: Student

## 2024-05-11 ENCOUNTER — Ambulatory Visit: Payer: Self-pay

## 2024-05-11 ENCOUNTER — Telehealth (INDEPENDENT_AMBULATORY_CARE_PROVIDER_SITE_OTHER): Payer: Self-pay | Admitting: Student

## 2024-05-11 DIAGNOSIS — Z7901 Long term (current) use of anticoagulants: Secondary | ICD-10-CM | POA: Diagnosis not present

## 2024-05-11 DIAGNOSIS — U071 COVID-19: Secondary | ICD-10-CM

## 2024-05-11 DIAGNOSIS — Z86711 Personal history of pulmonary embolism: Secondary | ICD-10-CM

## 2024-05-11 DIAGNOSIS — Z86718 Personal history of other venous thrombosis and embolism: Secondary | ICD-10-CM

## 2024-05-11 DIAGNOSIS — R0981 Nasal congestion: Secondary | ICD-10-CM

## 2024-05-11 MED ORDER — GUAIFENESIN ER 600 MG PO TB12: 600.0000 mg | ORAL_TABLET | Freq: Two times a day (BID) | ORAL | 0 refills | Status: AC

## 2024-05-11 MED ORDER — FLUTICASONE PROPIONATE 50 MCG/ACT NA SUSP: 1.0000 | Freq: Every day | NASAL | 2 refills | Status: AC

## 2024-05-11 NOTE — Progress Notes (Signed)
 Virtual Visit via Video Note  I connected with Sonya Garner on 05/11/24 at  1:15 PM EDT by a video enabled telemedicine application and verified that I am speaking with the correct person using two identifiers.  Patient Location: Home Provider Location: Office/Clinic  I discussed the limitations, risks, security, and privacy concerns of performing an evaluation and management service by video and the availability of in person appointments. I also discussed with the patient that there may be a patient responsible charge related to this service. The patient expressed understanding and agreed to proceed.  Subjective: PCP: Tobie Gaines, DO  Chief Complaint  Patient presents with   Covid Positive   Presents for telehealth acute visit today.  Reports testing positive for COVID today.  Reports symptom onset 3 days ago (Friday) with bodyaches, fatigue, and headache.  Progressed to nasal congestion and drainage along with shortness of breath, chest tightness.  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  SOB when walking to bathroom. Able to speak in full sentences but at times feels winded. Denies fever, chills, nausea, vomiting.  Tolerating p.o. intake and able to maintain adequate hydration.  States unable to taste food at this time.  Has taken Tylenol  for headache with some relief.  Denies any recent sick contacts.  Reports adherence to her Eliquis  without missed dosing.  Was recently evaluated earlier this month for concerns of recurrent VTE and seen in the ED, CT angio negative for PE and DVT study negative for acute DVT. No recent injuries/trauma/surgery (endoscopy in Sept). Denies any new symptoms for LLE.    ROS: Per HPI  Current Outpatient Medications:    fluticasone (FLONASE) 50 MCG/ACT nasal spray, Place 1 spray into both nostrils daily., Disp: 15.8 mL, Rfl: 2   guaiFENesin (MUCINEX) 600 MG 12 hr tablet, Take 1 tablet (600 mg total) by mouth 2 (two) times daily., Disp: 30  tablet, Rfl: 0   ELIQUIS  5 MG TABS tablet, TAKE 1 TABLET(5 MG) BY MOUTH TWICE DAILY, Disp: 180 tablet, Rfl: 3   ferrous sulfate  325 (65 FE) MG tablet, Take 1 tablet (325 mg total) by mouth every other day., Disp: , Rfl:   Observations/Objective: There were no vitals filed for this visit. Physical Exam Constitutional:      General: She is not in acute distress.    Appearance: She is not ill-appearing.  Pulmonary:     Comments: Non-labored in appearance. Able to speak in full sentences.  Neurological:     Mental Status: She is alert and oriented to person, place, and time.     Assessment and Plan: Assessment & Plan Positive self-administered antigen test for COVID-19 Nasal congestion Acute onset of 3-4 days.  Recent CTA w/o new PE, LE u/s w/o new DVT. Adherent w/ AC. Denies fever and tolerating po fluids w/o issue. Increased SOB from baseline but does not appear in distress at this visit. Not candidate for Paxlovid at this time. Continue tylenol  1000 mg Q6-8H PRN. Encourage adequate hydration at home. Add on Mucinex and nasal steroid spray for congestion.   Return precautions discussed w/ patient if symptoms worsen or develop new symptoms to seek UC/ED. Provided work note to excuse patient for this week.   Orders:   guaiFENesin (MUCINEX) 600 MG 12 hr tablet; Take 1 tablet (600 mg total) by mouth 2 (two) times daily.   fluticasone (FLONASE) 50 MCG/ACT nasal spray; Place 1 spray into both nostrils daily.     Follow Up Instructions: Return in  about 2 weeks (around 05/25/2024) for if symptoms worsen or fail to improve.     The patient was advised to call back or seek an in-person evaluation if the symptoms worsen or if the condition fails to improve as anticipated.    Patient discussed with Dr. Machen  Doria Fern, DO

## 2024-05-11 NOTE — Patient Instructions (Signed)
 Sorry to hear you tested positive for Covid. Stay at home and rest. Remember to stay hydrated. Will send Mucinex to take twice a day for next 1-2 weeks. Try Flonase into each nostril daily. Continue tylenol  as needed for pain. Work note sent to you, try to rest and recover this week and try to avoid large gatherings with symptoms.   Call our office or seek urgent care/emergency room if symptoms worsen such as worsening shortness of breath, new fever or unable tolerate oral fluids to stay hydrated.   I have ordered the following medication/changed the following medications:  Start the following medications: Meds ordered this encounter  Medications   guaiFENesin (MUCINEX) 600 MG 12 hr tablet    Sig: Take 1 tablet (600 mg total) by mouth 2 (two) times daily.    Dispense:  30 tablet    Refill:  0   fluticasone (FLONASE) 50 MCG/ACT nasal spray    Sig: Place 1 spray into both nostrils daily.    Dispense:  15.8 mL    Refill:  2     Should you have any questions or concerns please call the internal medicine clinic at [phone].    Candie Gintz, D.O. [facility]

## 2024-05-11 NOTE — Telephone Encounter (Addendum)
 FYI Only or Action Required?: FYI only for provider.  Patient was last seen in primary care on 01/08/2024 by Elicia Sharper, DO.  Called Nurse Triage reporting Covid Positive.  Symptoms began several days ago.  Interventions attempted: Rest, hydration, or home remedies.  Symptoms are: gradually worsening.  Triage Disposition: Call PCP Within 24 Hours  Patient/caregiver understands and will follow disposition?:  Reason for Disposition  [1] HIGH RISK patient (e.g., weak immune system, 65 years and older, obesity with BMI 30 or higher, pregnant, chronic lung disease) AND [2] COVID symptoms (e.g., cough, fever)  (Exceptions: Already seen by PCP and no new or worsening symptoms.)  Answer Assessment - Initial Assessment Questions 1. SYMPTOMS: What is your main symptom or concern? (e.g., cough, fever, shortness of breath, muscle aches)     Cough, loss of taste and smell, aches, chills, congestion  2. ONSET: When did the symptoms start?      05/08/24  3. COUGH:      Yes  4. FEVER: Do you have a fever? If Yes, ask: What is your temperature, how was it measured, and when did it start?     Denies  5. BREATHING DIFFICULTY: Are you having any difficulty breathing? (e.g., normal; shortness of breath, wheezing, unable to speak)      *No Answer*  6. BETTER-SAME-WORSE: Are you getting better, staying the same or getting worse compared to yesterday?  If getting worse, ask, In what way?     *No Answer*  7. OTHER SYMPTOMS: Do you have any other symptoms?  (e.g., chills, fatigue, headache, loss of smell or taste, muscle pain, sore throat)     HA  8. COVID-19 DIAGNOSIS: How do you know that you have COVID? (e.g., positive lab test or self-test, diagnosed by doctor or NP/PA, symptoms after exposure).     Home test  9. HIGH RISK DISEASE: Do you have any chronic medical problems? (e.g., asthma, heart or lung disease, weak immune system, obesity, etc.)       DVT  Protocols  used: COVID-19 - Diagnosed or Suspected-A-AH Copied from CRM #[id number]. Topic: Clinical - Red Word Triage >> May 11, 2024 12:14 PM Antonio DEL wrote: Red Word that prompted transfer to Nurse Triage: Patient took at home COVID test today, tested positive. experiencing no taste/smell, body aches, chills, stuffy nose. afraid to take anything over the counter due to being on Eliquis .

## 2024-05-12 NOTE — Progress Notes (Signed)
 Internal Medicine Clinic Attending  Case discussed with the resident at the time of the visit.  We reviewed the resident's history and exam and pertinent patient test results.  I agree with the assessment, diagnosis, and plan of care documented in the resident's note.

## 2024-06-09 ENCOUNTER — Ambulatory Visit (INDEPENDENT_AMBULATORY_CARE_PROVIDER_SITE_OTHER): Payer: Self-pay

## 2024-06-09 ENCOUNTER — Other Ambulatory Visit: Payer: Self-pay

## 2024-06-09 VITALS — BP 124/83 | HR 111 | Temp 98.4°F | Ht 64.0 in | Wt 186.2 lb

## 2024-06-09 DIAGNOSIS — Z0000 Encounter for general adult medical examination without abnormal findings: Secondary | ICD-10-CM

## 2024-06-09 DIAGNOSIS — F17290 Nicotine dependence, other tobacco product, uncomplicated: Secondary | ICD-10-CM

## 2024-06-09 DIAGNOSIS — Z7901 Long term (current) use of anticoagulants: Secondary | ICD-10-CM

## 2024-06-09 DIAGNOSIS — Z23 Encounter for immunization: Secondary | ICD-10-CM | POA: Diagnosis not present

## 2024-06-09 DIAGNOSIS — I82402 Acute embolism and thrombosis of unspecified deep veins of left lower extremity: Secondary | ICD-10-CM

## 2024-06-09 DIAGNOSIS — M79672 Pain in left foot: Secondary | ICD-10-CM

## 2024-06-09 DIAGNOSIS — F17200 Nicotine dependence, unspecified, uncomplicated: Secondary | ICD-10-CM

## 2024-06-09 NOTE — Assessment & Plan Note (Signed)
 Patient stopped smoking a few months ago.

## 2024-06-09 NOTE — Assessment & Plan Note (Signed)
 Received flu shot today.

## 2024-06-09 NOTE — Patient Instructions (Signed)
 Thank you, [name] for allowing us  to provide your care today. Today we discussed the following:  - Please continue to use the compression stockings  - Use Voltaren  gel, Tylenol  twice daily as needed, ice or heat for symptom relief  - Try reaching out to your Vein Specialist to see if there are any sooner appointments   Referrals ordered today:   Referral Orders         Ambulatory referral to Physical Therapy        Follow up: 1 month    Remember: If your symptoms do not get better or are worsening, please come back to be re-evaluated.   Should you have any questions or concerns please call the Internal Medicine Clinic at [phone].     Sonya Miyamoto, MD [facility]

## 2024-06-09 NOTE — Progress Notes (Signed)
 Patient name: Sonya Garner Date of birth: Mar 04, 1981 Date of visit: 06/09/24  Type of visit: Acute Office Visit   Subjective   Chief concern:  Chief Complaint  Patient presents with   Follow-up    Has concerns with her foots /  swelling with numbness for about 2.5 weeks / flu shot    Sonya Garner is a 43 y.o. female with a history of bilateral PE, LLE DVT, uterine fibroids, and IDA, who presents to [facility] clinic for evaluation of left foot.  Patient presents to clinic today with concerns of 2.5 weeks of left foot pain and numbness in her toes.  She states that the numbness is mainly in her 2nd, 3rd and 4th digits and is a constant feeling.  She also notes that when she has been on her feet or walking for longer periods of time, she feels a burning sensation that feels like a carpet burn.  She also notes that she has noticed a bit more swelling in her feet and the pain is mostly on the top of her foot.  Of note, this is the same leg that she has a DVT in and so it is swollen larger compared to her right lower extremity at baseline since her clot.  She states that she has been taking Eliquis  5 mg twice daily as prescribed, and has not missed any doses.  She states that she has tried compression socks, though states that this did not help much.  She has also been trying to elevate her legs both at work and at home as much as possible.  She has tried Tylenol  for pain relief, which she reports has alleviated some of the pain.  She states that she did try a heating pad and although it worked in the moment, once it was removed leg felt more stiff.  She denies any prior surgery or injury to the area.  She also states that she has not had an injury recently, has not bumped into anything, and has not dropped anything on her feet.   ROS: Negative unless otherwise listed in the HPI.  Patient Active Problem List   Diagnosis Date Noted   Foot pain, left 06/09/2024   Personal history of venous  thrombosis and embolism 04/28/2024   Healthcare maintenance 01/09/2024   Abnormal uterine bleeding 02/07/2023   Uterine fibroid 11/26/2022   Iron  deficiency anemia 11/26/2022   Hx of Bilateral pulmonary embolism (HCC) 11/21/2022   DVT, lower extremity, distal, acute, left (HCC) 11/21/2022   Tobacco use 02/20/2007     Past Surgical History:  Procedure Laterality Date   IR ANGIOGRAM PELVIS SELECTIVE OR SUPRASELECTIVE  04/25/2023   IR ANGIOGRAM SELECTIVE EACH ADDITIONAL VESSEL  04/25/2023   IR ANGIOGRAM SELECTIVE EACH ADDITIONAL VESSEL  04/25/2023   IR ANGIOGRAM SELECTIVE EACH ADDITIONAL VESSEL  04/25/2023   IR EMBO ART  VEN HEMORR LYMPH EXTRAV  INC GUIDE ROADMAPPING  04/25/2023   IR RADIOLOGIST EVAL & MGMT  05/24/2023   IR US  GUIDE VASC ACCESS LEFT  04/25/2023   TUBAL LIGATION       Current Outpatient Medications  Medication Instructions   ELIQUIS  5 MG TABS tablet TAKE 1 TABLET(5 MG) BY MOUTH TWICE DAILY   ferrous sulfate  325 mg, Oral, Every other day   fluticasone  (FLONASE ) 50 MCG/ACT nasal spray 1 spray, Each Nare, Daily   guaiFENesin  (MUCINEX ) 600 mg, Oral, 2 times daily    Social History   Tobacco Use   Smoking status:  Former    Average packs/day: 0.3 packs/day for 10.0 years (2.5 ttl pk-yrs)    Types: Cigarettes    Start date: 11/14/2022   Smokeless tobacco: Never   Tobacco comments:    Stopped 11/19/2022   Vaping Use   Vaping status: Every Day   Substances: Nicotine  Substance Use Topics   Alcohol use: No   Drug use: No      Objective  Today's Vitals   06/09/24 0907  BP: 124/83  Pulse: (!) 111  Temp: 98.4 F (36.9 C)  TempSrc: Oral  SpO2: 97%  Weight: 186 lb 3.2 oz (84.5 kg)  Height: 5' 4 (1.626 m)  PainSc: 0-No pain  Body mass index is 31.96 kg/m.   Physical Exam:   Constitutional: well-appearing female sitting in exam chair, in no acute distress. Ambulates without use of assistance device HEENT: normocephalic atraumatic, mucous membranes  moist Eyes: conjunctiva non-erythematous Neck: supple Cardiovascular: regular rate and rhythm, bilateral radial pulses 2+, bilateral dorsal pedal pulses 2+, brisk capillary refill bilateral feet and hands  Pulmonary/Chest: normal work of breathing on room air, lungs clear to auscultation bilaterally Abdominal: soft, non-tender, non-distended MSK: normal bulk and tone. Left calf > right at baseline; left foot tenderness to palpation of dorsal aspect of mid-foot, decreased AROM though patient feels similar to baseline after DVT, mild pain with resistance to DF and EV Neurological: alert & oriented x 3 Skin: warm and dry, no ulcers or lesions on bilateral feet Psych: mood calm, behavior normal, thought content normal, judgement normal      The 10-year ASCVD risk score (Arnett DK, et al., 2019) is: 0.5%   Values used to calculate the score:     Age: 36 years     Clincally relevant sex: Female     Is Non-Hispanic African American: Yes     Diabetic: No     Tobacco smoker: No     Systolic Blood Pressure: 124 mmHg     Is BP treated: No     HDL Cholesterol: 52 mg/dL     Total Cholesterol: 159 mg/dL      Assessment & Plan  Problem List Items Addressed This Visit       Other   Tobacco use   Patient stopped smoking a few months ago.      Healthcare maintenance   Received flu shot today.      Foot pain, left   Patient presents to clinic with left foot pain, swelling, burning and numbness of her 2nd, 3rd and 4th digits.  Of note, this is the same side that she has a DVT in.  Unclear exactly what has been causing her pain, though differential includes postthrombotic syndrome, tarsal tunnel syndrome, peroneal tendon neuropathy due to LLE DVT swelling, or other neuropathy. Morton's neuroma also considered given pain and burning along her forefoot and into her toes, though she reports dorsal pain as well as pain that extends along her lateral ankle, and would be less consistent with this. She  has been putting less weight, and changing her gait ever since she was diagnosed with LLE DVT.  She has difficulty completing heel raises, and generally has difficulty with AROM in all planes of her foot ever since her clot.  We discussed conservative measures including continued use of her compression stockings, topical Voltaren  gel and/or lidocaine  patches, and Tylenol  as needed for the pain.  We also discussed ice as needed to alleviate symptoms.  I would like to initiate physical therapy  to assist with gait training, sensory function, and mobilization exercises.  We discussed that if she worsens or continues to have issues, that we would like to see her back in we will consider other neuropathy workup.  She is going to be seeing [facility] Dr. Lanis, but that appointment is not until 09/11/2023. - Referral to PT - Voltaren  gel, compression stockings, lidocaine  patches, Tylenol  prn, ice prn, rest and elevate leg when able - Renewed 6 month handicap placard       Other Visit Diagnoses       Encounter for immunization    -  Primary   Relevant Orders   Flu vaccine trivalent PF, 6mos and older(Flulaval,Afluria,Fluarix,Fluzone ) (Completed)     Left foot pain       Relevant Orders   Ambulatory referral to Physical Therapy       Return in about 1 month (around 07/09/2024).  Patient discussed with Dr. Jeanelle, who also saw and evaluated the patient.  Doyal Miyamoto, MD [facility]  PGY-1 06/09/2024, 4:16 PM

## 2024-06-09 NOTE — Assessment & Plan Note (Addendum)
 Patient presents to clinic with left foot pain, swelling, burning and numbness of her 2nd, 3rd and 4th digits.  Of note, this is the same side that she has a DVT in.  Unclear exactly what has been causing her pain, though differential includes postthrombotic syndrome, tarsal tunnel syndrome, peroneal tendon neuropathy due to LLE DVT swelling, or other neuropathy. Morton's neuroma also considered given pain and burning along her forefoot and into her toes, though she reports dorsal pain as well as pain that extends along her lateral ankle, and would be less consistent with this. She has been putting less weight, and changing her gait ever since she was diagnosed with LLE DVT.  She has difficulty completing heel raises, and generally has difficulty with AROM in all planes of her foot ever since her clot.  We discussed conservative measures including continued use of her compression stockings, topical Voltaren  gel and/or lidocaine  patches, and Tylenol  as needed for the pain.  We also discussed ice as needed to alleviate symptoms.  I would like to initiate physical therapy to assist with gait training, sensory function, and mobilization exercises.  We discussed that if she worsens or continues to have issues, that we would like to see her back in we will consider other neuropathy workup.  She is going to be seeing [facility] Dr. Lanis, but that appointment is not until 09/11/2023. - Referral to PT - Voltaren  gel, compression stockings, lidocaine  patches, Tylenol  prn, ice prn, rest and elevate leg when able - Renewed 6 month handicap placard

## 2024-06-11 NOTE — Progress Notes (Signed)
 Internal Medicine Clinic Attending  I was physically present during the key portions of the resident provided service and participated in the medical decision making of patient's management care. I reviewed pertinent patient test results.  The assessment, diagnosis, and plan were formulated together and I agree with the documentation in the resident's note.  Jeanelle Layman CROME, MD

## 2024-06-15 ENCOUNTER — Telehealth: Payer: Self-pay

## 2024-06-15 DIAGNOSIS — D509 Iron deficiency anemia, unspecified: Secondary | ICD-10-CM

## 2024-06-15 NOTE — Telephone Encounter (Signed)
-----   Message from [facility] Clarita H sent at 03/23/2024  1:47 PM EDT ----- Regarding: due for labs in Dec CBC and TIBC/ferritin panel in early Dec

## 2024-06-15 NOTE — Telephone Encounter (Signed)
 MyChart message to patient to go to the lab this week or next

## 2024-06-29 ENCOUNTER — Other Ambulatory Visit

## 2024-06-29 DIAGNOSIS — D509 Iron deficiency anemia, unspecified: Secondary | ICD-10-CM

## 2024-06-29 LAB — CBC WITH DIFFERENTIAL/PLATELET
Basophils Absolute: 0.1 K/uL (ref 0.0–0.1)
Basophils Relative: 1.3 % (ref 0.0–3.0)
Eosinophils Absolute: 0.1 K/uL (ref 0.0–0.7)
Eosinophils Relative: 1.5 % (ref 0.0–5.0)
HCT: 42.2 % (ref 36.0–46.0)
Hemoglobin: 14.4 g/dL (ref 12.0–15.0)
Lymphocytes Relative: 41.7 % (ref 12.0–46.0)
Lymphs Abs: 2 K/uL (ref 0.7–4.0)
MCHC: 34.1 g/dL (ref 30.0–36.0)
MCV: 86.5 fl (ref 78.0–100.0)
Monocytes Absolute: 0.4 K/uL (ref 0.1–1.0)
Monocytes Relative: 8.9 % (ref 3.0–12.0)
Neutro Abs: 2.3 K/uL (ref 1.4–7.7)
Neutrophils Relative %: 46.6 % (ref 43.0–77.0)
Platelets: 232 K/uL (ref 150.0–400.0)
RBC: 4.88 Mil/uL (ref 3.87–5.11)
RDW: 15.4 % (ref 11.5–15.5)
WBC: 4.8 K/uL (ref 4.0–10.5)

## 2024-06-29 LAB — IBC + FERRITIN
Ferritin: 19.5 ng/mL (ref 10.0–291.0)
Iron: 66 ug/dL (ref 42–145)
Saturation Ratios: 13.8 % — ABNORMAL LOW (ref 20.0–50.0)
TIBC: 478.8 ug/dL — ABNORMAL HIGH (ref 250.0–450.0)
Transferrin: 342 mg/dL (ref 212.0–360.0)

## 2024-06-30 ENCOUNTER — Ambulatory Visit: Payer: Self-pay | Admitting: Gastroenterology

## 2024-07-20 ENCOUNTER — Ambulatory Visit
Admission: RE | Admit: 2024-07-20 | Discharge: 2024-07-20 | Disposition: A | Discharge status: Home / Self Care | Source: Ambulatory Visit

## 2024-07-20 VITALS — BP 124/85 | HR 89 | Temp 98.5°F | Resp 17

## 2024-07-20 DIAGNOSIS — S29019A Strain of muscle and tendon of unspecified wall of thorax, initial encounter: Secondary | ICD-10-CM | POA: Diagnosis not present

## 2024-07-20 MED ORDER — LIDOCAINE 5 % EX PTCH: 1.0000 | MEDICATED_PATCH | CUTANEOUS | 0 refills | Status: AC

## 2024-07-20 MED ORDER — CYCLOBENZAPRINE HCL 5 MG PO TABS: 5.0000 mg | ORAL_TABLET | Freq: Three times a day (TID) | ORAL | 0 refills | Status: AC | PRN

## 2024-07-20 NOTE — ED Provider Notes (Signed)
 " [facility]    CSN: [id number] Arrival date & time: 07/20/24  9040      History   Chief Complaint Chief Complaint  Patient presents with   Back Pain    Constant back pain with intermittent spasms. Nausea but no vomiting, no fever .symptoms started Monday Dec 28. Pain worsens with movement and lying down. Have been taking Tylenol  for pain. Symptoms are not improving - Entered by patient    HPI Sonya Garner is a 44 y.o. female.   Patient presents today due to 2 weeks worth of left-sided lower back pain.  Patient denies known injury or trauma to the back.  Patient denies changes in bowel or bladder habits, saddle anesthesia, numbness or tingling in her legs.  Patient states that she has been taking Tylenol , patient is unable to take ibuprofen  as she is on blood thinners.  Patient denies relief with use of Tylenol .  States that about 3 days ago she started having muscle spasms that lasted about 3 to 5 minutes at a time.  The history is provided by the patient.  Back Pain   Past Medical History:  Diagnosis Date   Chronic kidney disease    kidney stone   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   Qualifier: Diagnosis of   By: WATT, JOANNE       Mild concussion 12/05/2016   PE (pulmonary thromboembolism) (HCC) 11/14/2022    Patient Active Problem List   Diagnosis Date Noted   Foot pain, left 06/09/2024   Personal history of venous thrombosis and embolism 04/28/2024   Healthcare maintenance 01/09/2024   Abnormal uterine bleeding 02/07/2023   Uterine fibroid 11/26/2022   Iron  deficiency anemia 11/26/2022   Hx of Bilateral pulmonary embolism (HCC) 11/21/2022   DVT, lower extremity, distal, acute, left (HCC) 11/21/2022   Tobacco use 02/20/2007    Past Surgical History:  Procedure Laterality Date   IR ANGIOGRAM PELVIS SELECTIVE OR SUPRASELECTIVE  04/25/2023   IR ANGIOGRAM SELECTIVE EACH ADDITIONAL VESSEL  04/25/2023   IR ANGIOGRAM SELECTIVE EACH ADDITIONAL VESSEL   04/25/2023   IR ANGIOGRAM SELECTIVE EACH ADDITIONAL VESSEL  04/25/2023   IR EMBO ART  VEN HEMORR LYMPH EXTRAV  INC GUIDE ROADMAPPING  04/25/2023   IR RADIOLOGIST EVAL & MGMT  05/24/2023   IR US  GUIDE VASC ACCESS LEFT  04/25/2023   TUBAL LIGATION      OB History     Gravida  4   Para  4   Term  4   Preterm  0   AB  0   Living  4      SAB  0   IAB  0   Ectopic  0   Multiple  0   Live Births  4            Home Medications    Prior to Admission medications  Medication Sig Start Date End Date Taking? Authorizing Provider  cyclobenzaprine  (FLEXERIL ) 5 MG tablet Take 1 tablet (5 mg total) by mouth 3 (three) times daily as needed. 07/20/24  Yes Andra Krabbe C, PA-C  ELIQUIS  5 MG TABS tablet TAKE 1 TABLET(5 MG) BY MOUTH TWICE DAILY 03/24/24  Yes Tobie Gaines, DO  ferrous sulfate  325 (65 FE) MG tablet Take 1 tablet (325 mg total) by mouth every other day. 01/09/24  Yes Zheng, Michael, DO  fluticasone  (FLONASE ) 50 MCG/ACT nasal spray Place 1 spray into both nostrils daily. 05/11/24 05/11/25 Yes Elicia Sharper, DO  guaiFENesin  (MUCINEX ) 600  MG 12 hr tablet Take 1 tablet (600 mg total) by mouth 2 (two) times daily. 05/11/24  Yes Zheng, Michael, DO  lidocaine  (LIDODERM ) 5 % Place 1 patch onto the skin daily. Remove & Discard patch within 12 hours or as directed by MD 07/20/24  Yes Andra Corean BROCKS, PA-C    Family History Family History  Problem Relation Age of Onset   Breast cancer Maternal Grandmother    Cancer Maternal Grandmother    Breast cancer Maternal Aunt    Cancer Maternal Aunt        breast   Cancer Maternal Uncle    Hearing loss Neg Hx    Colon cancer Neg Hx    Esophageal cancer Neg Hx     Social History Social History[1]   Allergies   Feraheme [ferumoxytol ]   Review of Systems Review of Systems  Musculoskeletal:  Positive for back pain.     Physical Exam Triage Vital Signs ED Triage Vitals  Encounter Vitals Group     BP 07/20/24  1023 124/85     Girls Systolic BP Percentile --      Girls Diastolic BP Percentile --      Boys Systolic BP Percentile --      Boys Diastolic BP Percentile --      Pulse Rate 07/20/24 1023 89     Resp 07/20/24 1023 17     Temp 07/20/24 1023 98.5 F (36.9 C)     Temp src --      SpO2 07/20/24 1023 99 %     Weight --      Height --      Head Circumference --      Peak Flow --      Pain Score 07/20/24 1022 7     Pain Loc --      Pain Education --      Exclude from Growth Chart --    No data found.  Updated Vital Signs BP 124/85   Pulse 89   Temp 98.5 F (36.9 C)   Resp 17   SpO2 99%   Visual Acuity Right Eye Distance:   Left Eye Distance:   Bilateral Distance:    Right Eye Near:   Left Eye Near:    Bilateral Near:     Physical Exam Vitals and nursing note reviewed.  Constitutional:      General: She is not in acute distress.    Appearance: Normal appearance. She is not ill-appearing, toxic-appearing or diaphoretic.  Eyes:     General: No scleral icterus. Cardiovascular:     Rate and Rhythm: Normal rate and regular rhythm.     Heart sounds: Normal heart sounds.  Pulmonary:     Effort: Pulmonary effort is normal. No respiratory distress.     Breath sounds: Normal breath sounds. No wheezing or rhonchi.  Musculoskeletal:       Back:     Comments: Tenderness to palpation where indicated on diagram. Pain elicited with forward flexion, left and right lateral flexion  Skin:    General: Skin is warm.  Neurological:     Mental Status: She is alert and oriented to person, place, and time.  Psychiatric:        Mood and Affect: Mood normal.        Behavior: Behavior normal.      UC Treatments / Results  Labs (all labs ordered are listed, but only abnormal results are displayed) Labs Reviewed - No data to display  EKG   Radiology No results found.  Procedures Procedures (including critical care time)  Medications Ordered in UC Medications - No data to  display  Initial Impression / Assessment and Plan / UC Course  I have reviewed the triage vital signs and the nursing notes.  Pertinent labs & imaging results that were available during my care of the patient were reviewed by me and considered in my medical decision making (see chart for details).     Final Clinical Impressions(s) / UC Diagnoses   Final diagnoses:  Thoracic myofascial strain, initial encounter   Discharge Instructions   None    ED Prescriptions     Medication Sig Dispense Auth. Provider   lidocaine  (LIDODERM ) 5 % Place 1 patch onto the skin daily. Remove & Discard patch within 12 hours or as directed by MD 9 patch Andra Krabbe C, PA-C   cyclobenzaprine  (FLEXERIL ) 5 MG tablet Take 1 tablet (5 mg total) by mouth 3 (three) times daily as needed. 30 tablet Andra Krabbe BROCKS, PA-C      PDMP not reviewed this encounter.    [1]  Social History Tobacco Use   Smoking status: Former    Average packs/day: 0.3 packs/day for 10.0 years (2.5 ttl pk-yrs)    Types: Cigarettes    Start date: 11/14/2022   Smokeless tobacco: Never   Tobacco comments:    Stopped 11/19/2022   Vaping Use   Vaping status: Every Day   Substances: Nicotine  Substance Use Topics   Alcohol use: No   Drug use: No     Andra Krabbe BROCKS, PA-C 07/20/24 1054  "

## 2024-07-20 NOTE — ED Triage Notes (Addendum)
 Pt present with c/o constant lt side lower back pain x 2 weeks. Pt c/o the pain worsening at night, has trouble sitting.  States the spasms last 3-5 minutes and have been going on x 3 days. States she feels a squeezing pain.  Home interventions: tylenol  and heating pads- has not had any releif

## 2024-08-17 ENCOUNTER — Other Ambulatory Visit: Payer: Self-pay

## 2024-08-17 DIAGNOSIS — I872 Venous insufficiency (chronic) (peripheral): Secondary | ICD-10-CM

## 2024-09-10 ENCOUNTER — Encounter: Admitting: Vascular Surgery

## 2024-09-10 ENCOUNTER — Ambulatory Visit (HOSPITAL_COMMUNITY)

## 2024-09-28 ENCOUNTER — Ambulatory Visit: Payer: Self-pay | Admitting: Student

## 2024-10-29 ENCOUNTER — Inpatient Hospital Stay: Admitting: Hematology
